# Patient Record
Sex: Female | Born: 1945
Health system: Southern US, Community
[De-identification: ages and names within clinical notes are randomized; demographics above are authoritative.]

## PROBLEM LIST (undated history)

## (undated) DIAGNOSIS — Z5189 Encounter for other specified aftercare: Secondary | ICD-10-CM

## (undated) DIAGNOSIS — K5730 Diverticulosis of large intestine without perforation or abscess without bleeding: Secondary | ICD-10-CM

## (undated) DIAGNOSIS — D126 Benign neoplasm of colon, unspecified: Secondary | ICD-10-CM

## (undated) DIAGNOSIS — N952 Postmenopausal atrophic vaginitis: Secondary | ICD-10-CM

## (undated) DIAGNOSIS — B9681 Helicobacter pylori [H. pylori] as the cause of diseases classified elsewhere: Secondary | ICD-10-CM

## (undated) DIAGNOSIS — M899 Disorder of bone, unspecified: Secondary | ICD-10-CM

## (undated) DIAGNOSIS — L989 Disorder of the skin and subcutaneous tissue, unspecified: Secondary | ICD-10-CM

## (undated) DIAGNOSIS — IMO0002 Reserved for concepts with insufficient information to code with codable children: Secondary | ICD-10-CM

## (undated) DIAGNOSIS — M949 Disorder of cartilage, unspecified: Secondary | ICD-10-CM

## (undated) DIAGNOSIS — H3530 Unspecified macular degeneration: Secondary | ICD-10-CM

## (undated) DIAGNOSIS — K279 Peptic ulcer, site unspecified, unspecified as acute or chronic, without hemorrhage or perforation: Secondary | ICD-10-CM

## (undated) DIAGNOSIS — K648 Other hemorrhoids: Secondary | ICD-10-CM

## (undated) HISTORY — DX: Encounter for other specified aftercare: Z51.89

## (undated) HISTORY — PX: SKIN CANCER EXCISION: SHX779

## (undated) HISTORY — DX: Helicobacter pylori (H. pylori) as the cause of diseases classified elsewhere: K27.9

## (undated) HISTORY — DX: Disorder of the skin and subcutaneous tissue, unspecified: L98.9

## (undated) HISTORY — DX: Helicobacter pylori (H. pylori) as the cause of diseases classified elsewhere: B96.81

## (undated) HISTORY — DX: Benign neoplasm of colon, unspecified: D12.6

## (undated) HISTORY — PX: OTHER SURGICAL HISTORY: SHX169

## (undated) HISTORY — DX: Other hemorrhoids: K64.8

## (undated) HISTORY — PX: COLONOSCOPY W/ BIOPSIES: SHX1374

## (undated) HISTORY — DX: Unspecified macular degeneration: H35.30

## (undated) HISTORY — DX: Disorder of bone, unspecified: M89.9

## (undated) HISTORY — DX: Disorder of cartilage, unspecified: M94.9

## (undated) HISTORY — DX: Reserved for concepts with insufficient information to code with codable children: IMO0002

## (undated) HISTORY — PX: TUBAL LIGATION: SHX77

## (undated) HISTORY — DX: Postmenopausal atrophic vaginitis: N95.2

## (undated) HISTORY — DX: Diverticulosis of large intestine without perforation or abscess without bleeding: K57.30

---
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
## 1970-09-21 HISTORY — PX: GANGLION CYST EXCISION: SHX1691

## 1998-07-12 ENCOUNTER — Other Ambulatory Visit: Admission: RE | Admit: 1998-07-12 | Discharge: 1998-07-12 | Payer: Self-pay | Admitting: Obstetrics and Gynecology

## 1998-07-30 ENCOUNTER — Ambulatory Visit (HOSPITAL_COMMUNITY): Admission: RE | Admit: 1998-07-30 | Discharge: 1998-07-30 | Payer: Self-pay | Admitting: Obstetrics and Gynecology

## 1999-08-21 ENCOUNTER — Other Ambulatory Visit: Admission: RE | Admit: 1999-08-21 | Discharge: 1999-08-21 | Payer: Self-pay | Admitting: Obstetrics and Gynecology

## 1999-08-22 ENCOUNTER — Ambulatory Visit (HOSPITAL_COMMUNITY): Admission: RE | Admit: 1999-08-22 | Discharge: 1999-08-22 | Payer: Self-pay | Admitting: Obstetrics and Gynecology

## 1999-08-22 ENCOUNTER — Encounter: Payer: Self-pay | Admitting: Obstetrics and Gynecology

## 1999-09-22 HISTORY — PX: LYMPH NODE BIOPSY: SHX201

## 1999-10-09 ENCOUNTER — Encounter (INDEPENDENT_AMBULATORY_CARE_PROVIDER_SITE_OTHER): Payer: Self-pay | Admitting: Specialist

## 1999-10-09 ENCOUNTER — Ambulatory Visit (HOSPITAL_COMMUNITY): Admission: RE | Admit: 1999-10-09 | Discharge: 1999-10-09 | Payer: Self-pay | Admitting: *Deleted

## 2000-09-27 ENCOUNTER — Encounter: Payer: Self-pay | Admitting: Obstetrics and Gynecology

## 2000-09-27 ENCOUNTER — Ambulatory Visit (HOSPITAL_COMMUNITY): Admission: RE | Admit: 2000-09-27 | Discharge: 2000-09-27 | Payer: Self-pay | Admitting: Obstetrics and Gynecology

## 2000-10-06 ENCOUNTER — Other Ambulatory Visit: Admission: RE | Admit: 2000-10-06 | Discharge: 2000-10-06 | Payer: Self-pay | Admitting: Obstetrics and Gynecology

## 2001-03-31 ENCOUNTER — Encounter: Payer: Self-pay | Admitting: Obstetrics and Gynecology

## 2001-03-31 ENCOUNTER — Encounter: Admission: RE | Admit: 2001-03-31 | Discharge: 2001-03-31 | Payer: Self-pay | Admitting: Obstetrics and Gynecology

## 2001-11-22 ENCOUNTER — Other Ambulatory Visit: Admission: RE | Admit: 2001-11-22 | Discharge: 2001-11-22 | Payer: Self-pay | Admitting: Obstetrics and Gynecology

## 2001-11-23 ENCOUNTER — Ambulatory Visit (HOSPITAL_COMMUNITY): Admission: RE | Admit: 2001-11-23 | Discharge: 2001-11-23 | Payer: Self-pay | Admitting: Obstetrics and Gynecology

## 2001-11-23 ENCOUNTER — Encounter: Payer: Self-pay | Admitting: Obstetrics and Gynecology

## 2002-11-27 ENCOUNTER — Ambulatory Visit (HOSPITAL_COMMUNITY): Admission: RE | Admit: 2002-11-27 | Discharge: 2002-11-27 | Payer: Self-pay | Admitting: Obstetrics and Gynecology

## 2002-11-27 ENCOUNTER — Encounter: Payer: Self-pay | Admitting: Obstetrics and Gynecology

## 2003-01-17 ENCOUNTER — Other Ambulatory Visit: Admission: RE | Admit: 2003-01-17 | Discharge: 2003-01-17 | Payer: Self-pay | Admitting: Obstetrics and Gynecology

## 2003-11-29 ENCOUNTER — Ambulatory Visit (HOSPITAL_COMMUNITY): Admission: RE | Admit: 2003-11-29 | Discharge: 2003-11-29 | Payer: Self-pay | Admitting: Obstetrics and Gynecology

## 2004-01-31 ENCOUNTER — Other Ambulatory Visit: Admission: RE | Admit: 2004-01-31 | Discharge: 2004-01-31 | Payer: Self-pay | Admitting: Obstetrics and Gynecology

## 2004-10-23 ENCOUNTER — Encounter: Admission: RE | Admit: 2004-10-23 | Discharge: 2004-10-23 | Payer: Self-pay | Admitting: Obstetrics and Gynecology

## 2004-11-12 ENCOUNTER — Ambulatory Visit: Payer: Self-pay | Admitting: Internal Medicine

## 2004-11-13 ENCOUNTER — Ambulatory Visit: Payer: Self-pay | Admitting: Internal Medicine

## 2005-01-20 ENCOUNTER — Ambulatory Visit (HOSPITAL_COMMUNITY): Admission: RE | Admit: 2005-01-20 | Discharge: 2005-01-20 | Payer: Self-pay | Admitting: Obstetrics and Gynecology

## 2005-02-02 ENCOUNTER — Other Ambulatory Visit: Admission: RE | Admit: 2005-02-02 | Discharge: 2005-02-02 | Payer: Self-pay | Admitting: Addiction Medicine

## 2005-06-01 ENCOUNTER — Encounter: Admission: RE | Admit: 2005-06-01 | Discharge: 2005-06-01 | Payer: Self-pay | Admitting: Otolaryngology

## 2006-01-19 HISTORY — PX: OTHER SURGICAL HISTORY: SHX169

## 2006-01-26 ENCOUNTER — Ambulatory Visit (HOSPITAL_COMMUNITY): Admission: RE | Admit: 2006-01-26 | Discharge: 2006-01-26 | Payer: Self-pay | Admitting: Obstetrics and Gynecology

## 2006-01-31 ENCOUNTER — Inpatient Hospital Stay (HOSPITAL_COMMUNITY): Admission: EM | Admit: 2006-01-31 | Discharge: 2006-02-02 | Payer: Self-pay | Admitting: Emergency Medicine

## 2006-01-31 ENCOUNTER — Ambulatory Visit: Payer: Self-pay | Admitting: Internal Medicine

## 2006-01-31 DIAGNOSIS — IMO0002 Reserved for concepts with insufficient information to code with codable children: Secondary | ICD-10-CM

## 2006-01-31 HISTORY — DX: Reserved for concepts with insufficient information to code with codable children: IMO0002

## 2006-02-01 ENCOUNTER — Encounter (INDEPENDENT_AMBULATORY_CARE_PROVIDER_SITE_OTHER): Payer: Self-pay | Admitting: Specialist

## 2006-02-04 ENCOUNTER — Ambulatory Visit: Payer: Self-pay | Admitting: Internal Medicine

## 2006-02-08 ENCOUNTER — Ambulatory Visit: Payer: Self-pay | Admitting: Internal Medicine

## 2006-03-04 ENCOUNTER — Other Ambulatory Visit: Admission: RE | Admit: 2006-03-04 | Discharge: 2006-03-04 | Payer: Self-pay | Admitting: Obstetrics and Gynecology

## 2006-03-11 ENCOUNTER — Ambulatory Visit: Payer: Self-pay | Admitting: Internal Medicine

## 2006-03-16 ENCOUNTER — Ambulatory Visit: Payer: Self-pay | Admitting: Internal Medicine

## 2006-03-23 ENCOUNTER — Ambulatory Visit: Payer: Self-pay | Admitting: Endocrinology

## 2006-03-30 ENCOUNTER — Ambulatory Visit: Payer: Self-pay | Admitting: Internal Medicine

## 2006-03-30 HISTORY — PX: ESOPHAGOGASTRODUODENOSCOPY: SHX1529

## 2006-05-14 ENCOUNTER — Ambulatory Visit: Payer: Self-pay | Admitting: Internal Medicine

## 2006-06-08 ENCOUNTER — Ambulatory Visit: Payer: Self-pay | Admitting: Internal Medicine

## 2006-06-11 ENCOUNTER — Ambulatory Visit: Payer: Self-pay | Admitting: Internal Medicine

## 2007-01-28 ENCOUNTER — Ambulatory Visit (HOSPITAL_COMMUNITY): Admission: RE | Admit: 2007-01-28 | Discharge: 2007-01-28 | Payer: Self-pay | Admitting: Obstetrics and Gynecology

## 2007-03-09 ENCOUNTER — Other Ambulatory Visit: Admission: RE | Admit: 2007-03-09 | Discharge: 2007-03-09 | Payer: Self-pay | Admitting: Obstetrics and Gynecology

## 2007-03-21 ENCOUNTER — Ambulatory Visit: Payer: Self-pay | Admitting: Internal Medicine

## 2007-06-10 ENCOUNTER — Encounter: Payer: Self-pay | Admitting: *Deleted

## 2007-06-10 DIAGNOSIS — Z860100 Personal history of colon polyps, unspecified: Secondary | ICD-10-CM | POA: Insufficient documentation

## 2007-06-10 DIAGNOSIS — K2990 Gastroduodenitis, unspecified, without bleeding: Secondary | ICD-10-CM

## 2007-06-10 DIAGNOSIS — K2970 Gastritis, unspecified, without bleeding: Secondary | ICD-10-CM | POA: Insufficient documentation

## 2007-10-10 ENCOUNTER — Ambulatory Visit: Payer: Self-pay | Admitting: Internal Medicine

## 2007-10-26 ENCOUNTER — Ambulatory Visit: Payer: Self-pay | Admitting: Internal Medicine

## 2007-10-26 DIAGNOSIS — Z8711 Personal history of peptic ulcer disease: Secondary | ICD-10-CM | POA: Insufficient documentation

## 2007-10-26 DIAGNOSIS — K5730 Diverticulosis of large intestine without perforation or abscess without bleeding: Secondary | ICD-10-CM | POA: Insufficient documentation

## 2007-10-26 HISTORY — DX: Diverticulosis of large intestine without perforation or abscess without bleeding: K57.30

## 2007-10-26 LAB — HM COLONOSCOPY

## 2007-12-09 ENCOUNTER — Ambulatory Visit: Payer: Self-pay | Admitting: Internal Medicine

## 2007-12-12 LAB — CONVERTED CEMR LAB
BUN: 11 mg/dL (ref 6–23)
Basophils Absolute: 0 10*3/uL (ref 0.0–0.1)
Basophils Relative: 0.4 % (ref 0.0–1.0)
Bilirubin Urine: NEGATIVE
Bilirubin, Direct: 0.1 mg/dL (ref 0.0–0.3)
CO2: 32 meq/L (ref 19–32)
Calcium: 9.9 mg/dL (ref 8.4–10.5)
Chloride: 104 meq/L (ref 96–112)
Creatinine, Ser: 0.7 mg/dL (ref 0.4–1.2)
Direct LDL: 120.1 mg/dL
Eosinophils Relative: 2.7 % (ref 0.0–5.0)
HCT: 43.8 % (ref 36.0–46.0)
HDL: 97.2 mg/dL (ref >39.0)
Hemoglobin, Urine: NEGATIVE
MCHC: 32.2 g/dL (ref 30.0–36.0)
Monocytes Absolute: 0.5 10*3/uL (ref 0.2–0.7)
Neutro Abs: 3.6 10*3/uL (ref 1.4–7.7)
Neutrophils Relative %: 62.7 % (ref 43.0–77.0)
RBC: 4.67 M/uL (ref 3.87–5.11)
RDW: 11.7 % (ref 11.5–14.6)
Sodium: 141 meq/L (ref 135–145)
Total CHOL/HDL Ratio: 2.3
Urine Glucose: NEGATIVE mg/dL
Urobilinogen, UA: 0.2 (ref 0.0–1.0)
VLDL: 8 mg/dL (ref 0–40)
WBC: 5.8 10*3/uL (ref 4.5–10.5)

## 2007-12-14 ENCOUNTER — Ambulatory Visit: Payer: Self-pay | Admitting: Internal Medicine

## 2007-12-14 DIAGNOSIS — R209 Unspecified disturbances of skin sensation: Secondary | ICD-10-CM | POA: Insufficient documentation

## 2007-12-14 DIAGNOSIS — N3090 Cystitis, unspecified without hematuria: Secondary | ICD-10-CM | POA: Insufficient documentation

## 2008-01-18 DIAGNOSIS — K648 Other hemorrhoids: Secondary | ICD-10-CM

## 2008-01-18 DIAGNOSIS — R51 Headache: Secondary | ICD-10-CM

## 2008-01-18 DIAGNOSIS — M810 Age-related osteoporosis without current pathological fracture: Secondary | ICD-10-CM | POA: Insufficient documentation

## 2008-01-18 DIAGNOSIS — D126 Benign neoplasm of colon, unspecified: Secondary | ICD-10-CM | POA: Insufficient documentation

## 2008-01-18 DIAGNOSIS — R519 Headache, unspecified: Secondary | ICD-10-CM | POA: Insufficient documentation

## 2008-01-18 DIAGNOSIS — M8580 Other specified disorders of bone density and structure, unspecified site: Secondary | ICD-10-CM

## 2008-01-18 HISTORY — DX: Benign neoplasm of colon, unspecified: D12.6

## 2008-01-18 HISTORY — DX: Other hemorrhoids: K64.8

## 2008-02-09 ENCOUNTER — Ambulatory Visit (HOSPITAL_COMMUNITY): Admission: RE | Admit: 2008-02-09 | Discharge: 2008-02-09 | Payer: Self-pay | Admitting: Obstetrics and Gynecology

## 2008-02-16 ENCOUNTER — Ambulatory Visit: Payer: Self-pay | Admitting: Internal Medicine

## 2008-02-21 ENCOUNTER — Ambulatory Visit: Payer: Self-pay | Admitting: Internal Medicine

## 2008-02-21 ENCOUNTER — Telehealth: Payer: Self-pay | Admitting: Internal Medicine

## 2008-02-21 DIAGNOSIS — J069 Acute upper respiratory infection, unspecified: Secondary | ICD-10-CM | POA: Insufficient documentation

## 2008-03-12 ENCOUNTER — Other Ambulatory Visit: Admission: RE | Admit: 2008-03-12 | Discharge: 2008-03-12 | Payer: Self-pay | Admitting: Obstetrics and Gynecology

## 2008-07-03 ENCOUNTER — Ambulatory Visit: Payer: Self-pay | Admitting: Internal Medicine

## 2008-07-03 DIAGNOSIS — M545 Low back pain, unspecified: Secondary | ICD-10-CM | POA: Insufficient documentation

## 2008-07-03 DIAGNOSIS — R5382 Chronic fatigue, unspecified: Secondary | ICD-10-CM | POA: Insufficient documentation

## 2008-07-03 LAB — CONVERTED CEMR LAB
Bilirubin Urine: NEGATIVE
Hemoglobin, Urine: NEGATIVE
Ketones, ur: NEGATIVE mg/dL
Specific Gravity, Urine: <=1.005 (ref 1.000–1.03)
Total Protein, Urine: NEGATIVE mg/dL
pH: 6 (ref 5.0–8.0)

## 2008-07-13 ENCOUNTER — Telehealth: Payer: Self-pay | Admitting: Internal Medicine

## 2008-07-13 ENCOUNTER — Ambulatory Visit: Payer: Self-pay | Admitting: Internal Medicine

## 2008-07-13 LAB — CONVERTED CEMR LAB
Bilirubin Urine: NEGATIVE
Ketones, ur: NEGATIVE mg/dL
Mucus, UA: NEGATIVE
RBC / HPF: NONE SEEN
Urobilinogen, UA: 0.2 (ref 0.0–1.0)
pH: 6 (ref 5.0–8.0)

## 2008-07-19 ENCOUNTER — Encounter: Admission: RE | Admit: 2008-07-19 | Discharge: 2008-07-19 | Payer: Self-pay | Admitting: Internal Medicine

## 2008-07-31 ENCOUNTER — Ambulatory Visit: Payer: Self-pay | Admitting: Internal Medicine

## 2008-07-31 DIAGNOSIS — J019 Acute sinusitis, unspecified: Secondary | ICD-10-CM | POA: Insufficient documentation

## 2008-08-01 LAB — CONVERTED CEMR LAB
Albumin: 4.3 g/dL (ref 3.5–5.2)
Alkaline Phosphatase: 59 units/L (ref 39–117)
Basophils Absolute: 0 10*3/uL (ref 0.0–0.1)
Basophils Relative: 0.3 % (ref 0.0–3.0)
Bilirubin Urine: NEGATIVE
CO2: 30 meq/L (ref 19–32)
Calcium: 9.6 mg/dL (ref 8.4–10.5)
Chloride: 104 meq/L (ref 96–112)
Crystals: NEGATIVE
GFR calc non Af Amer: 108 mL/min
HCT: 40 % (ref 36.0–46.0)
Lymphocytes Relative: 31 % (ref 12.0–46.0)
MCHC: 34.4 g/dL (ref 30.0–36.0)
Monocytes Absolute: 0.6 10*3/uL (ref 0.1–1.0)
Monocytes Relative: 9.1 % (ref 3.0–12.0)
Mucus, UA: NEGATIVE
Nitrite: NEGATIVE
Platelets: 259 10*3/uL (ref 150–400)
Sodium: 141 meq/L (ref 135–145)
Total Protein, Urine: NEGATIVE mg/dL
Total Protein: 7.1 g/dL (ref 6.0–8.3)
Urine Glucose: NEGATIVE mg/dL
Urobilinogen, UA: 0.2 (ref 0.0–1.0)
WBC: 6.9 10*3/uL (ref 4.5–10.5)

## 2008-08-08 ENCOUNTER — Ambulatory Visit: Payer: Self-pay | Admitting: Cardiology

## 2008-08-08 ENCOUNTER — Encounter: Payer: Self-pay | Admitting: Internal Medicine

## 2009-02-15 ENCOUNTER — Ambulatory Visit: Payer: Self-pay | Admitting: Internal Medicine

## 2009-02-15 LAB — CONVERTED CEMR LAB
ALT: 19 units/L (ref 0–35)
AST: 33 units/L (ref 0–37)
Alkaline Phosphatase: 69 units/L (ref 39–117)
Bilirubin, Direct: 0.2 mg/dL (ref 0.0–0.3)
Chloride: 111 meq/L (ref 96–112)
Cholesterol: 229 mg/dL — ABNORMAL HIGH (ref 0–200)
Direct LDL: 111.7 mg/dL
HDL: 106 mg/dL (ref >39.00)
Ketones, ur: NEGATIVE mg/dL
Lymphocytes Relative: 23.2 % (ref 12.0–46.0)
Lymphs Abs: 1.5 10*3/uL (ref 0.7–4.0)
MCV: 95.2 fL (ref 78.0–100.0)
Monocytes Relative: 8.8 % (ref 3.0–12.0)
Nitrite: NEGATIVE
Platelets: 235 10*3/uL (ref 150.0–400.0)
Potassium: 4.6 meq/L (ref 3.5–5.1)
Sodium: 146 meq/L — ABNORMAL HIGH (ref 135–145)
Specific Gravity, Urine: 1.01 (ref 1.000–1.030)
Total CHOL/HDL Ratio: 2
Total Protein: 6.9 g/dL (ref 6.0–8.3)
Triglycerides: 44 mg/dL (ref 0.0–149.0)
Urine Glucose: NEGATIVE mg/dL
Urobilinogen, UA: 0.2 (ref 0.0–1.0)
WBC: 6.3 10*3/uL (ref 4.5–10.5)
pH: 5.5 (ref 5.0–8.0)

## 2009-02-20 ENCOUNTER — Ambulatory Visit: Payer: Self-pay | Admitting: Internal Medicine

## 2009-02-20 ENCOUNTER — Ambulatory Visit (HOSPITAL_COMMUNITY): Admission: RE | Admit: 2009-02-20 | Discharge: 2009-02-20 | Payer: Self-pay | Admitting: Obstetrics and Gynecology

## 2009-03-21 ENCOUNTER — Encounter: Payer: Self-pay | Admitting: Obstetrics and Gynecology

## 2009-03-21 ENCOUNTER — Ambulatory Visit: Payer: Self-pay | Admitting: Obstetrics and Gynecology

## 2009-03-21 ENCOUNTER — Other Ambulatory Visit: Admission: RE | Admit: 2009-03-21 | Discharge: 2009-03-21 | Payer: Self-pay | Admitting: Obstetrics and Gynecology

## 2010-01-30 ENCOUNTER — Ambulatory Visit: Payer: Self-pay | Admitting: Obstetrics and Gynecology

## 2010-01-30 ENCOUNTER — Telehealth: Payer: Self-pay | Admitting: Internal Medicine

## 2010-02-06 ENCOUNTER — Ambulatory Visit: Payer: Self-pay | Admitting: Obstetrics and Gynecology

## 2010-02-07 ENCOUNTER — Telehealth (INDEPENDENT_AMBULATORY_CARE_PROVIDER_SITE_OTHER): Payer: Self-pay | Admitting: *Deleted

## 2010-02-07 ENCOUNTER — Ambulatory Visit: Payer: Self-pay | Admitting: Internal Medicine

## 2010-02-07 ENCOUNTER — Telehealth: Payer: Self-pay | Admitting: Internal Medicine

## 2010-02-07 DIAGNOSIS — J189 Pneumonia, unspecified organism: Secondary | ICD-10-CM | POA: Insufficient documentation

## 2010-02-07 DIAGNOSIS — R509 Fever, unspecified: Secondary | ICD-10-CM | POA: Insufficient documentation

## 2010-02-11 LAB — CONVERTED CEMR LAB
AST: 30 units/L (ref 0–37)
Alkaline Phosphatase: 65 units/L (ref 39–117)
BUN: 12 mg/dL (ref 6–23)
Basophils Absolute: 0 10*3/uL (ref 0.0–0.1)
Bilirubin, Direct: 0.1 mg/dL (ref 0.0–0.3)
CO2: 29 meq/L (ref 19–32)
Creatinine, Ser: 0.6 mg/dL (ref 0.4–1.2)
Eosinophils Relative: 4.1 % (ref 0.0–5.0)
Hemoglobin: 14.1 g/dL (ref 12.0–15.0)
Lymphs Abs: 1.2 10*3/uL (ref 0.7–4.0)
Monocytes Relative: 6.8 % (ref 3.0–12.0)
Neutro Abs: 9.1 10*3/uL — ABNORMAL HIGH (ref 1.4–7.7)
Neutrophils Relative %: 78.3 % — ABNORMAL HIGH (ref 43.0–77.0)
RDW: 12.8 % (ref 11.5–14.6)
Sed Rate: 24 mm/hr — ABNORMAL HIGH (ref 0–22)
TSH: 1.55 microintl units/mL (ref 0.35–5.50)
Total Protein: 6.8 g/dL (ref 6.0–8.3)
Urine Glucose: NEGATIVE mg/dL
Urobilinogen, UA: 0.2 (ref 0.0–1.0)
Vitamin B-12: 350 pg/mL (ref 211–911)

## 2010-02-24 ENCOUNTER — Ambulatory Visit (HOSPITAL_COMMUNITY): Admission: RE | Admit: 2010-02-24 | Discharge: 2010-02-24 | Payer: Self-pay | Admitting: Obstetrics and Gynecology

## 2010-02-24 LAB — HM MAMMOGRAPHY: HM Mammogram: NEGATIVE

## 2010-04-29 ENCOUNTER — Ambulatory Visit: Payer: Self-pay | Admitting: Obstetrics and Gynecology

## 2010-04-29 ENCOUNTER — Other Ambulatory Visit: Admission: RE | Admit: 2010-04-29 | Discharge: 2010-04-29 | Payer: Self-pay | Admitting: Obstetrics and Gynecology

## 2010-10-21 NOTE — Progress Notes (Signed)
Summary: RESULTS  Phone Note Outgoing Call   Summary of Call: Maralyn Sago, pls call her before you go to confirm pneumonia on cxr - pick up 2 antibioticks (check VM on home tel). Thx! Initial call taken by: Tresa Garter MD,  Feb 07, 2010 5:37 PM  Follow-up for Phone Call        left mess to call office back. Follow-up by: Lamar Sprinkles, CMA,  Feb 07, 2010 6:03 PM  Additional Follow-up for Phone Call Additional follow up Details #1::        Pt informed  Additional Follow-up by: Lamar Sprinkles, CMA,  Feb 10, 2010 5:02 PM

## 2010-10-21 NOTE — Progress Notes (Signed)
Summary: NEEDS OV TODAY  Phone Note Call from Patient Call back at Home Phone [phone]   Summary of Call: Pt c/o chills and h/a, recently treated by gyn for uti. Pt left vm requesting office visit w/plot today. Initial call taken by: Lamar Sprinkles, CMA,  Feb 07, 2010 9:25 AM  Follow-up for Phone Call        Appt today at 3:45 pm w/Dr Plotnikov Follow-up by: Verdell Face,  Feb 07, 2010 9:41 AM

## 2010-10-21 NOTE — Assessment & Plan Note (Signed)
Summary: per flag today/chills/no fever/cd   Vital Signs:  Patient profile:   65 year old female Height:      66 inches Weight:      99 pounds BMI:     16.04 O2 Sat:      106 % on 70 Temp:     99.4 degrees F oral Pulse rate:   84 / minute BP sitting:   106 / 70  (left arm) Cuff size:   regular  Vitals Entered By: Bill Salinas CMA (Feb 07, 2010 4:01 PM)  O2 Flow:  70 CC: C/O recent uti, fever, joint pain, HA, and hot skin./kb Is Patient Diabetic? No Pain Assessment Patient in pain? no        CC:  C/O recent uti, fever, joint pain, HA, and and hot skin./kb.  History of Present Illness: C/o UTI (achy, joint aces, HA, LBP, urgency) last Thur - took Nitrofurantoin, felt better while on it and had a repeated nl UA yesterday. She is feeling worse again.  She started to have aches, fatigue, somnolence x 1.5 wks ago. C/o dry infrequent cough.   Current Medications (verified): 1)  Vitamin D3 1000 Unit  Tabs (Cholecalciferol) .[name] Kitchen.. 1 By Mouth Daily 2)  Calcium 1000unit Tabs .[name] Kitchen.. 1 By Mouth Qd  Allergies (verified): No Known Drug Allergies  Past History:  Past Medical History: Last updated: 10/26/2007 Colonic polyps, hx of Gastric ulcer and GI bleed 5/07 hospitalization s/p eradication of H. pylori (confirmed by urea breath test) Osteopenia Headaches Mild diverticulosis (colonoscopy 10/26/07)  Social History: Last updated: 12/14/2007 Occupation:accountant Married Never Smoked Regular exercise-yes  Review of Systems       The patient complains of fever and prolonged cough.  The patient denies weight loss, chest pain, syncope, dyspnea on exertion, abdominal pain, melena, hematochezia, severe indigestion/heartburn, hematuria, incontinence, genital sores, difficulty walking, and angioedema.    Physical Exam  General:  Thin, NAD, looks tired Head:  Normocephalic and atraumatic without obvious abnormalities. No apparent alopecia or balding. Eyes:  No corneal or conjunctival  inflammation noted. EOMI. Perrla Ears:  External ear exam shows no significant lesions or deformities.  Otoscopic examination reveals clear canals, tympanic membranes are intact bilaterally without bulging, retraction, inflammation or discharge. Hearing is grossly normal bilaterally. Nose:  External nasal examination shows no deformity or inflammation. Nasal mucosa are pink and moist without lesions or exudates. Mouth:  Oral mucosa and oropharynx without lesions or exudates.  Teeth in good repair. Neck:  No deformities, masses, or tenderness noted. Lungs:  Normal respiratory effort, chest expands symmetrically. Lungs are clear to auscultation, no crackles or wheezes. Heart:  Normal rate and regular rhythm. S1 and S2 normal without gallop, murmur, click, rub or other extra sounds. Abdomen:  Bowel sounds positive,abdomen soft and non-tender without masses, organomegaly or hernias noted. Msk:  No deformity or scoliosis noted of thoracic or lumbar spine.   Pulses:  R and L carotid,radial,femoral,dorsalis pedis and posterior tibial pulses are full and equal bilaterally Extremities:  No clubbing, cyanosis, edema, or deformity noted with normal full range of motion of all joints.   Neurologic:  No cranial nerve deficits noted. Station and gait are normal. Plantar reflexes are down-going bilaterally. DTRs are symmetrical throughout. Sensory, motor and coordinative functions appear intact. Skin:  Intact without suspicious lesions or rashes Cervical Nodes:  No lymphadenopathy noted Psych:  Cognition and judgment appear intact. Alert and cooperative with normal attention span and concentration. No apparent delusions, illusions, hallucinations   Impression &  Recommendations:  Problem # 1:  FEVER UNSPECIFIED (ICD-780.60) Assessment New  Orders: TLB-B12, Serum-Total ONLY (98119-J47) TLB-BMP (Basic Metabolic Panel-BMET) (80048-METABOL) TLB-CBC Platelet - w/Differential (85025-CBCD) TLB-Hepatic/Liver  Function Pnl (80076-HEPATIC) TLB-Sedimentation Rate (ESR) (85652-ESR) TLB-TSH (Thyroid Stimulating Hormone) (84443-TSH) TLB-Udip ONLY (81003-UDIP) TLB-Mono Test (Monospot) (82956-OZHY) T-Blood Culture (86578-46962) T-2 View CXR, Same Day (71020.5TC)  Problem # 2:  FATIGUE (ICD-780.79) Assessment: New  Orders: TLB-B12, Serum-Total ONLY (95284-X32) TLB-BMP (Basic Metabolic Panel-BMET) (80048-METABOL) TLB-CBC Platelet - w/Differential (85025-CBCD) TLB-Hepatic/Liver Function Pnl (80076-HEPATIC) TLB-Sedimentation Rate (ESR) (85652-ESR) TLB-TSH (Thyroid Stimulating Hormone) (84443-TSH) TLB-Udip ONLY (81003-UDIP) TLB-Mono Test (Monospot) (44010-UVOZ) T-Blood Culture (36644-03474)  Problem # 3:  HEADACHE (ICD-784.0) Assessment: New  Her updated medication list for this problem includes:    Fioricet 50-325-40 Mg Tabs (Butalbital-apap-caffeine) .[name] Kitchen... 1-2 by mouth two times a day as needed migraine or tension ha  Orders: TLB-B12, Serum-Total ONLY (25956-L87) TLB-BMP (Basic Metabolic Panel-BMET) (80048-METABOL) TLB-CBC Platelet - w/Differential (85025-CBCD) TLB-Hepatic/Liver Function Pnl (80076-HEPATIC) TLB-Sedimentation Rate (ESR) (85652-ESR) TLB-TSH (Thyroid Stimulating Hormone) (84443-TSH) TLB-Udip ONLY (81003-UDIP) TLB-Mono Test (Monospot) (56433-IRJJ) T-Blood Culture (88416-60630)  Problem # 4:  PNEUMONIA (ICD-486) LLL, likely caused all of the above Assessment: New  I left a VM w/results Two antibiotics to take  Complete Medication List: 1)  Vitamin D3 1000 Unit Tabs (Cholecalciferol) .[name] Kitchen.. 1 by mouth daily 2)  Calcium 1000unit Tabs  .[name] KitchenMarland Kitchen. 1 by mouth qd 3)  Zithromax Z-pak 250 Mg Tabs (Azithromycin) .... As dirrected 4)  Fioricet 50-325-40 Mg Tabs (Butalbital-apap-caffeine) .[name] Kitchen.. 1-2 by mouth two times a day as needed migraine or tension ha 5)  Ceftin 500 Mg Tabs (Cefuroxime axetil) .[name] Kitchen.. 1 by mouth bid  Patient Instructions: 1)  Call if you are not better in a reasonable  amount of time or if worse. Go to ER if feeling really bad! 2)  Please schedule a follow-up appointment in 2 months. Prescriptions: CEFTIN 500 MG TABS (CEFUROXIME AXETIL) 1 by mouth bid  #20 x 1   Entered and Authorized by:   Tresa Garter MD   Signed by:   Tresa Garter MD on 02/07/2010   Method used:   Electronically to        [facility]  [address] [id number]* (retail)       [address]       [place], [place]  [address]       Ph: [phone] or [phone]       Fax: [phone]   RxID:   [id number] FIORICET 50-325-40 MG TABS (BUTALBITAL-APAP-CAFFEINE) 1-2 by mouth two times a day as needed migraine or tension HA  #60 x 2   Entered and Authorized by:   Tresa Garter MD   Signed by:   Tresa Garter MD on 02/07/2010   Method used:   Print then Give to Patient   RxID:   [id number] ZITHROMAX Z-PAK 250 MG TABS (AZITHROMYCIN) as dirrected  #1 x 0   Entered and Authorized by:   Tresa Garter MD   Signed by:   Tresa Garter MD on 02/07/2010   Method used:   Print then Give to Patient   RxID:   [id number] FIORICET 50-325-40 MG TABS (BUTALBITAL-APAP-CAFFEINE) 1-2 by mouth two times a day as needed migraine or tension HA  #60 x 2   Entered and Authorized by:   Tresa Garter MD   Signed by:   Tresa Garter MD on 02/07/2010   Method used:  Electronically to        [id number]* (retail)       [address]       [place], [place]  [address]       Ph: [phone] or [phone]       Fax: [phone]   RxID:   [id number] ZITHROMAX Z-PAK 250 MG TABS (AZITHROMYCIN) as dirrected  #1 x 0   Entered and Authorized by:   Tresa Garter MD   Signed by:   Tresa Garter MD on 02/07/2010   Method used:   Electronically to        [id number] (retail)       [address]       [place], [place]  [address]       Ph:  [phone] or [phone]       Fax: [phone]   RxID:   [id number]

## 2010-10-21 NOTE — Progress Notes (Signed)
Summary: ABX?  Phone Note Call from Patient Call back at [facility] Phone [phone]   Summary of Call: Pt c/o frequent urination and feels she has a uti. Patient is requesting rx for antibiotic.  Initial call taken by: Lamar Sprinkles, CMA,  Jan 30, 2010 9:59 AM  Follow-up for Phone Call        OK Cipro OV if sick  Follow-up by: Tresa Garter MD,  Jan 30, 2010 12:03 PM  Additional Follow-up for Phone Call Additional follow up Details #1::        Pt already recieved rx from GYN Additional Follow-up by: Lamar Sprinkles, CMA,  Jan 30, 2010 1:44 PM    New/Updated Medications: CIPROFLOXACIN HCL 250 MG TABS (CIPROFLOXACIN HCL) 1 by mouth two times a day for cystitis Prescriptions: CIPROFLOXACIN HCL 250 MG TABS (CIPROFLOXACIN HCL) 1 by mouth two times a day for cystitis  #10 x 0   Entered and Authorized by:   Tresa Garter MD   Signed by:   Lamar Sprinkles, CMA on 01/30/2010   Method used:   Electronically to        [id number] (retail)       [address]       [place], [place]  [address]       Ph: [phone] or [phone]       Fax: [phone]   RxID:   [id number]

## 2011-02-03 NOTE — Assessment & Plan Note (Signed)
[facility]                         GASTROENTEROLOGY OFFICE NOTE   NAME:[name], [name] TROIANO                      MRN:          [id number]  DATE:03/21/2007                            DOB:          1946/02/12    CHIEF COMPLAINT:  Follow up of ulcer.   PROBLEMS:  1. History of gastric ulcer with GI bleed.  2. Status post eradication (breast test confirmed) of H.pylori.  3. Previous history of adenomatous colon polyps, last colonoscopy February 23, 2002, with hyperplastic rectal polyps, otherwise, negative (Dr.      Juanda Chance.  Note, the patient requested to change to me for follow      up).  4. History of childhood anemia, treated with iron.  5. History of headaches.  6. History of osteopenia.  7. Prior tonsillectomy.  8. Prior appendectomy.  9. Prior right ganglion cyst surgery.  10.Benign lymph node biopsy from the groin.  11.Skin cancer removed from the forehead in the past.   MEDICATIONS:  1. Omeprazole 20 mg daily.  2. Calcium daily.  3. Multivitamin daily.   INTERVAL HISTORY:  She has done well except for occasional upper  abdominal pains that have gone off and on for years.  Sometimes when she  drinks water she will have some epigastric aching or pain.  Certain  foods will do that as well.  There has been no weight loss.  No steady  persistent symptoms that have been chronically intermittent.  She did  receive a recall notice for her colonoscopy in April.  That is roughly a  five year anniversary.  She is wondering about putting that off for  about six months or so.   PHYSICAL EXAMINATION:  VITAL SIGNS:  Weight 107, pulse 78, blood  pressure 118/72.   ASSESSMENT:  1. Personal history of peptic ulcer disease, resolved.  She rarely      uses NSAIDs at this time.  2. Symptoms compatible with irritable bowel syndrome/functional      dyspepsia.  3. Personal history of adenomatous colon polyps, though none on last      colonoscopy five years  ago.   RECOMMENDATIONS/PLAN:  1. I think she can try to taper off the Omeprazole.  I have given her      a refill for year, and she will see how she does.  If she needs to      take it for symptom control, she can do so.  2. Monitor these other symptoms, but they are stable and chronic, and      episodic as mentioned.  There were no worrisome features.  3. She will call back to schedule her colonoscopy.  She may wait until      January of 2009.  I told her that would probably be okay, but there      was no way I could guarantee a significant polyp or lesion would      not show up in the interim, and the decision was hers.  She      understands that.     Iva Boop,  MD,FACG  Electronically Signed    CEG/MedQ  DD: 03/21/2007  DT: 03/22/2007  Job #: [id number]

## 2011-02-05 ENCOUNTER — Other Ambulatory Visit: Payer: Self-pay | Admitting: Obstetrics and Gynecology

## 2011-02-05 DIAGNOSIS — Z1231 Encounter for screening mammogram for malignant neoplasm of breast: Secondary | ICD-10-CM

## 2011-02-06 NOTE — Assessment & Plan Note (Signed)
[facility]                                 ON-CALL NOTE   NAME:Culhane, KAYSEE HERGERT                      MRN:          [id number]  DATE:08/11/2008                            DOB:          Aug 12, 1946    PHONE NUMBER:  [phone].   PRIMARY CARE PHYSICIAN:  Georgina Quint. Plotnikov, MD   SUBJECTIVE:  Ever since she had a CT scan at the end of last week, she  has been noting some hives around her waist as well as itchiness at the  injection site.  She believes she was given the contrast at that area.  She denies shortness of breath or tongue swelling.   ASSESSMENT AND PLAN:  I recommended Benadryl 25-50 mg every 6 hours  p.r.n. itching and hives.  If she is sedated with Benadryl, she can  instead use a non-drowsy antihistamine such as Claritin or Zyrtec.     Kerby Nora, MD  Electronically Signed    AB/MedQ  DD: 08/11/2008  DT: 08/11/2008  Job #: [id number]

## 2011-02-06 NOTE — Consult Note (Signed)
[name], BOXER NO.:  [id number]   MEDICAL RECORD NO.:  [id number]          PATIENT TYPE:  INP   LOCATION:  3707                         FACILITY:  [facility]   PHYSICIAN:  Iva Boop, M.D. LHCDATE OF BIRTH:  1945/09/26   DATE OF CONSULTATION:  02/01/2006  DATE OF DISCHARGE:                                   CONSULTATION   REQUESTING PHYSICIAN:  Rene Paci, M.D.   PRIMARY CARE PHYSICIAN:  Georgina Quint. Plotnikov, M.D.   REASON FOR CONSULTATION:  Gastrointestinal bleeding.   ASSESSMENT:  History of melena, heme positive stool and anemia with  hemoglobin 7.4. Her BUN was 37, creatinine 0.6. She has had some presyncope  and possible syncope.   I think it is quite likely that she has an upper GI bleed, question from  ulcer disease, etc. She has had some epigastric pain as well.   PLAN:  Schedule upper GI endoscopy to investigate for cause of bleeding.  Note, she had a colonoscopy in 2003 that showed some diminutive colon polyps  (I do not have that pathology).   HISTORY:  A 65 year old white woman with story as above. She was admitted to  the hospital last night with a hemoglobin of 7.4, that has risen to 10.3  after 2 units of packed red cells. BUN 37/0.6 down to 21/0.6. Coags normal.  She had the epigastric pain about a month ago intermittent off and on  radiating up into the neck, fatigue, sleepy and described some melena. She  had Hemoccult positive stool in the emergency department, she has had some  presyncope off and on. Denied dysphagia, chronic heartburn or bowel habit  changes otherwise.   DRUG ALLERGIES:  None known.   MEDICATIONS:  She was on an aspirin at home and that is it. Protonix 40 mg  b.i.d. here.   PAST MEDICAL HISTORY:  1.  Colonoscopy with polyps 2003 as noted.  2.  Childhood anemia treated with iron.  3.  Headaches.  4.  Osteopenia.  5.  Tonsillectomy, appendectomy, right ganglion cyst, benign lymph node      biopsy  from the groin, forehead skin cancer.   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   FAMILY HISTORY:  Peptic ulcer disease in her father.   REVIEW OF SYSTEMS:  Occasional palpitations or irregular heartbeat. She has  been a runner but has changed to walking and hiking over time. Denies edema  and no dyspnea. No dysuria. She did fall and hit the back of her head six  weeks ago. She has had some headache trouble complaints. All other systems  appear negative.   PHYSICAL EXAMINATION:  GENERAL:  Reveals a pleasant, well-developed, white  woman in no acute distress, slightly pale.  VITAL SIGNS:  Temperature 98.3, pulse 74, respirations 18.  HEENT:  Eyes anicteric, mouth free of lesions.  NECK:  Supple with no mass.  CHEST:  Clear.  HEART:  S1, S2, no murmurs, rubs or gallops.  ABDOMEN:  Soft, nontender without any masses or organomegaly.  RECTAL:  As per the emergency room note, Hemoccult positive.  EXTREMITIES:  No cyanosis, clubbing or edema.  PSYCHE:  She is alert and oriented x3.  LYMPH NODES:  No neck or supraclavicular nodes palpated.   LABORATORY DATA:  As above. The remainder of her electrolytes are normal.   EKG normal sinus rhythm, incomplete right bundle branch block is indicated.  Borderline EKG.   I appreciate the opportunity to care for this patient.      Iva Boop, M.D. [facility]  Electronically Signed     CEG/MEDQ  D:  02/01/2006  T:  02/02/2006  Job:  [id number]   cc:   Georgina Quint. Plotnikov, M.D. [facility]  [address]  [place]  [place] [address]

## 2011-02-06 NOTE — H&P (Signed)
[name], [name]               ACCOUNT NO.:  [id number]   MEDICAL RECORD NO.:  [id number]          PATIENT TYPE:  EMS   LOCATION:  MAJO                         FACILITY:  [facility]   PHYSICIAN:  Sean A. Everardo All, M.D. [facility] OF BIRTH:  1946-06-30   DATE OF ADMISSION:  01/31/2006  DATE OF DISCHARGE:                                HISTORY & PHYSICAL   REASON FOR ADMISSION:  Gastrointestinal bleed.   HISTORY OF PRESENT ILLNESS:  A 65 year old woman who states that 6 weeks ago  she had had a presyncopal episode.  She now has 1 week of slight pain at the  epigastric area with several more associated episodes of presyncope and  headache.   PAST MEDICAL HISTORY:  Good general health.   MEDICATIONS:  Aspirin 325 mg daily.   FAMILY HISTORY:  Negative for GI bleed.   SOCIAL HISTORY:  Her husband is with her.  She works as an Airline pilot.  She  is a nonsmoker, and, when I ask her about alcohol, she states a few with  dinner.   REVIEW OF SYSTEMS:  Denies the following:  Rectal bleeding, hematuria, chest  pain, shortness of breath, skin rash, loss of consciousness, visual loss,  dysuria, weight loss, jaundice, dysphagia and diarrhea.   PHYSICAL EXAMINATION:  VITAL SIGNS:  Blood pressure is 111/53, heart rate  77, respiratory rate 20, temperature 97.8.  GENERAL:  Pale.  In no distress.  SKIN:  Not diaphoretic.  HEENT:  No scleral icterus.  Pharynx normal.  NECK:  Supple.  CHEST:  Clear to auscultation.  CARDIOVASCULAR:  No JVD, no edema.  Regular rate and rhythm.  No murmur.  Pedal pulses are intact.  ABDOMEN:  Soft, nontender.  Specifically, there is no tenderness at the  epigastric area.  No hepatosplenomegaly, no mass.  EXTREMITIES:  No deformity is seen.  NEUROLOGIC:  Alert and oriented.  She did not appear anxious or depressed.  She readily moves all fours.   LABORATORY STUDIES:  WBCs 7200, hemoglobin 7.4, platelets 211.  CPK and  troponin are normal, and fecal occult blood is  positive.   IMPRESSION:  1.  Gastrointestinal bleed, probably upper.  2.  Anemia due to #1.  3.  Syncope due to #1 and #2.  4.  Epigastric pain, which is probably due to gastric or duodenal ulcer, but      I cannot be certain that this is not chest pain.   PLAN:  1.  Admit to telemetry.  2.  Transfuse.  I discussed risks and benefits, and she agrees.  3.  Three sets of cardiac enzymes.  4.  PPI therapy.  5.  Consult GI.  6.  We discussed code status.  She states she wants to be full code, but      would not want to be started or maintained on artificial life support      measures if there is not a reasonable chance of functional recovery.  7.  Check pro time, CMET and electrocardiogram, if not done.           ______________________________  Gregary Signs  A. Everardo All, M.D. [facility]     SAE/MEDQ  D:  01/31/2006  T:  01/31/2006  Job:  [id number]   cc:   Georgina Quint. Plotnikov, M.D. [facility]  [address]  [place]  [place] [address]

## 2011-02-06 NOTE — Assessment & Plan Note (Signed)
[facility]                           GASTROENTEROLOGY OFFICE NOTE   NAME:[name], [name]                      MRN:          [id number]  DATE:06/08/2006                            DOB:          September 11, 1946    See medical history and physical for full details.   ASSESSMENT:  1. History of prior gastric ulcer, healing demonstrated on      esophagogastroduodenoscopy March 30, 2006.  She was treated for      Helicobacter pylori with Prevpac.  2. She is asymptomatic at this time.   PLAN:  1. H. Pylori breath test to confirm eradications.  2. Resumption of prophylactic aspirin and NSAIDs per patient and Dr.      Posey Rea at this point.  See my EGD note of March 30, 2006, for further      recommendations.   Note, she had some recent dyspnea and indigestion.  She was tried on  Carafate which made her feel worse so she has stopped that.  She can call  back if there are further problems, but she feels well at this time.                                   Iva Boop, MD,FACG   CEG/MedQ  DD:  06/08/2006  DT:  06/09/2006  Job #:  [id number]

## 2011-02-06 NOTE — Discharge Summary (Signed)
[name], [name]               ACCOUNT NO.:  [id number]   MEDICAL RECORD NO.:  [id number]          PATIENT TYPE:  INP   LOCATION:  3707                         FACILITY:  [facility]   PHYSICIAN:  Rene Paci, M.D. LHCDATE OF BIRTH:  Nov 10, 1945   DATE OF ADMISSION:  01/31/2006  DATE OF DISCHARGE:  02/02/2006                                 DISCHARGE SUMMARY   DISCHARGE DIAGNOSES:  1.  Gastric ulcer status post esophagogastroduodenoscopy performed Feb 01, 2006.  2.  Helicobacter pylori positive  3.  Acute blood loss anemia secondary to upper gastrointestinal bleed.  4.  Presyncope, likely secondary to underlying anemia.   HISTORY OF PRESENT ILLNESS:  The patient is 65 year old female who reported  a presyncopal episode approximately 6 weeks ago. At the time of admission,  she reported a 1-week history of slight pain in the epigastric area which  was worse after eating, and she has also had several other associated  episodes of presyncope and headache.  The patient was admitted for further  evaluation.   PAST MEDICAL HISTORY:  1.  History of colon polyps.  2.  History of childhood anemia treated with iron.  3.  Headaches.  4.  History of osteopenia.   COURSE OF HOSPITALIZATION:  #1.  GASTRIC ULCER:  The patient was admitted, and  a GI consult was  obtained. She was noted to have positive fecal occult blood, and the patient  underwent an EGD performed on Feb 01, 2006, by Dr. Stan Head.  EGD  revealed a proximal body ulcer with no active bleeding. A CLOtest was  performed which was positive for H.  Pylori. The patient will be discharged  home on a Prevpac, was instructed to take a proton pump inhibitor, was given  a prescription for Prevacid by GI. She is also instructed to hold off on  taking aspirin for 1 month and then resume at 81 mg, and she is instructed  to start iron 325 mg p.o. twice daily after melena is completely resolved.   #2.  ACUTE BLOOD LOSS ANEMIA:  The patient required 2 units of packed red  blood cells for hemoglobin of 7.4.  The patient's hemoglobin at time of  discharge is 9.5.  She will need a followup CBC at her appointment with her  primary care to ensure that hemoglobin and hematocrit are remaining stable.  She was noted to be mildly hypotensive on admission which improved after  transfusion and IV hydration.   #3.  HISTORY OF PRESYNCOPAL EVENT: The patient's presyncopal event was  likely secondary to underlying anemia and possibly also hypotension in the  setting of acute blood loss.  She currently has no symptoms.   PERTINENT LABORATORY DATA AT DISCHARGE:  Hemoglobin 9.5, hematocrit 27.5.  CLOtest positive for H.  Pylori. BUN 21, creatinine 0.6.   MEDICATIONS AT DISCHARGE:  1.  Prevpac. The patient is to take as directed until finished and then      start Prevacid or other proton pump inhibitor once daily.  2.  Aspirin. The patient is instructed to discontinue  for 1 month  and then      start 81 mg p.o. daily.  3.  Iron 325 mg p.o. twice daily. The patient is a is instructed to wait a      few days until stools look normal prior to starting.  4.  Multivitamin 1 tablet p.o. daily.  5.  The patient was taking vitamin C which was self prescribed prior to      admission.  She is instructed to stop vitamin C supplementation for now.   DISPOSITION:  The patient will be transferred home.  She is currently in  stable condition.   FOLLOW UP:  The patient is instructed to follow up with Dr. Lina Sar on  June 25 and 9:15 a.m. She is also instructed to follow up with Dr. Sonda Primes on May 21 at 3:45 p.m. She is instructed call Dr. Posey Rea should  she notice increased weakness, dizziness, or abdominal pain.      Melissa S. Peggyann Juba, NP      Rene Paci, M.D. [facility]  Electronically Signed    MSO/MEDQ  D:  02/02/2006  T:  02/02/2006  Job:  [id number]   cc:   Lina Sar, M.D. [facility]  [address]   [place]  [place] [address]   Georgina Quint. Plotnikov, M.D. [facility]  [address]  [place]  [place] [address]

## 2011-03-17 ENCOUNTER — Ambulatory Visit (HOSPITAL_COMMUNITY)
Admission: RE | Admit: 2011-03-17 | Discharge: 2011-03-17 | Disposition: A | Discharge status: Home / Self Care | Payer: Medicare Other | Source: Ambulatory Visit | Attending: Obstetrics and Gynecology | Admitting: Obstetrics and Gynecology

## 2011-03-17 DIAGNOSIS — Z1231 Encounter for screening mammogram for malignant neoplasm of breast: Secondary | ICD-10-CM | POA: Insufficient documentation

## 2011-04-06 ENCOUNTER — Encounter: Payer: Medicare Other | Admitting: Internal Medicine

## 2011-05-07 ENCOUNTER — Telehealth: Payer: Self-pay | Admitting: Internal Medicine

## 2011-05-07 ENCOUNTER — Other Ambulatory Visit (INDEPENDENT_AMBULATORY_CARE_PROVIDER_SITE_OTHER): Payer: Medicare Other

## 2011-05-07 ENCOUNTER — Other Ambulatory Visit (INDEPENDENT_AMBULATORY_CARE_PROVIDER_SITE_OTHER): Payer: Medicare Other | Admitting: Internal Medicine

## 2011-05-07 ENCOUNTER — Encounter: Payer: Self-pay | Admitting: Internal Medicine

## 2011-05-07 ENCOUNTER — Ambulatory Visit (INDEPENDENT_AMBULATORY_CARE_PROVIDER_SITE_OTHER): Payer: Medicare Other | Admitting: Internal Medicine

## 2011-05-07 VITALS — BP 108/70 | HR 76 | Temp 97.9°F | Resp 16 | Ht 66.0 in | Wt 99.0 lb

## 2011-05-07 DIAGNOSIS — Z0000 Encounter for general adult medical examination without abnormal findings: Secondary | ICD-10-CM

## 2011-05-07 DIAGNOSIS — Z136 Encounter for screening for cardiovascular disorders: Secondary | ICD-10-CM

## 2011-05-07 DIAGNOSIS — Z79899 Other long term (current) drug therapy: Secondary | ICD-10-CM

## 2011-05-07 DIAGNOSIS — E875 Hyperkalemia: Secondary | ICD-10-CM

## 2011-05-07 DIAGNOSIS — Z23 Encounter for immunization: Secondary | ICD-10-CM

## 2011-05-07 LAB — CBC WITH DIFFERENTIAL/PLATELET
Basophils Relative: 0.3 % (ref 0.0–3.0)
Eosinophils Relative: 3.7 % (ref 0.0–5.0)
HCT: 42.9 % (ref 36.0–46.0)
MCV: 93.9 fl (ref 78.0–100.0)
Monocytes Absolute: 0.5 10*3/uL (ref 0.1–1.0)
Monocytes Relative: 8.9 % (ref 3.0–12.0)
Neutrophils Relative %: 58.3 % (ref 43.0–77.0)
Platelets: 284 10*3/uL (ref 150.0–400.0)
RBC: 4.57 Mil/uL (ref 3.87–5.11)
WBC: 5.4 10*3/uL (ref 4.5–10.5)

## 2011-05-07 LAB — TSH: TSH: 1.37 u[IU]/mL (ref 0.35–5.50)

## 2011-05-07 LAB — COMPREHENSIVE METABOLIC PANEL
ALT: 19 U/L (ref 0–35)
AST: 37 U/L (ref 0–37)
Alkaline Phosphatase: 72 U/L (ref 39–117)
CO2: 27 mEq/L (ref 19–32)
Creatinine, Ser: 0.6 mg/dL (ref 0.4–1.2)
GFR: 106.44 mL/min (ref >60.00)
Sodium: 138 mEq/L (ref 135–145)
Total Bilirubin: 0.8 mg/dL (ref 0.3–1.2)
Total Protein: 7.1 g/dL (ref 6.0–8.3)

## 2011-05-07 LAB — LDL CHOLESTEROL, DIRECT: Direct LDL: 112.4 mg/dL

## 2011-05-07 LAB — URINALYSIS, ROUTINE W REFLEX MICROSCOPIC
Bilirubin Urine: NEGATIVE
Ketones, ur: NEGATIVE
Leukocytes, UA: NEGATIVE
Specific Gravity, Urine: 1.025 (ref 1.000–1.030)
Total Protein, Urine: NEGATIVE
Urine Glucose: NEGATIVE
pH: 5.5 (ref 5.0–8.0)

## 2011-05-07 LAB — LIPID PANEL
Cholesterol: 223 mg/dL — ABNORMAL HIGH (ref 0–200)
Total CHOL/HDL Ratio: 2
Triglycerides: 35 mg/dL (ref 0.0–149.0)
VLDL: 7 mg/dL (ref 0.0–40.0)

## 2011-05-07 NOTE — Telephone Encounter (Signed)
[name], please, inform patient that all labs are normal except for elev K - likely a lab error. We can recheck in 1 mo. Chol is a little elevated due to a very high level of HDL (good) cholesterol.   Please, mail the labs to the patient. Thx

## 2011-05-07 NOTE — Progress Notes (Signed)
  Subjective:    Patient ID: [name], female    DOB: 11-15-1945, 65 y.o.   MRN: [id number]  HPI The patient is here for a wellness exam. The patient has been doing well overall without major physical or psychological issues going on lately.  Review of Systems  Constitutional: Negative for fever, chills, diaphoresis, activity change, appetite change, fatigue and unexpected weight change.  HENT: Negative for hearing loss, ear pain, congestion, sore throat, sneezing, mouth sores, neck pain, dental problem, voice change, postnasal drip and sinus pressure.   Eyes: Negative for pain and visual disturbance.  Respiratory: Negative for cough, chest tightness, wheezing and stridor.   Cardiovascular: Negative for chest pain, palpitations and leg swelling.  Gastrointestinal: Negative for nausea, vomiting, abdominal pain, blood in stool, abdominal distention and rectal pain.  Genitourinary: Negative for dysuria, hematuria, decreased urine volume, vaginal bleeding, vaginal discharge, difficulty urinating, vaginal pain and menstrual problem.  Musculoskeletal: Negative for back pain, joint swelling and gait problem.  Skin: Negative for color change, rash and wound.  Neurological: Negative for dizziness, tremors, syncope, speech difficulty and light-headedness.  Hematological: Negative for adenopathy.  Psychiatric/Behavioral: Negative for suicidal ideas, hallucinations, behavioral problems, confusion, sleep disturbance, dysphoric mood and decreased concentration. The patient is not hyperactive.        Objective:   Physical Exam  Constitutional: She appears well-developed and well-nourished. No distress.  HENT:  Head: Normocephalic.  Right Ear: External ear normal.  Left Ear: External ear normal.  Nose: Nose normal.  Mouth/Throat: Oropharynx is clear and moist.  Eyes: Conjunctivae are normal. Pupils are equal, round, and reactive to light. Right eye exhibits no discharge. Left eye exhibits no  discharge.  Neck: Normal range of motion. Neck supple. No JVD present. No tracheal deviation present. No thyromegaly present.  Cardiovascular: Normal rate, regular rhythm and normal heart sounds.   Pulmonary/Chest: No stridor. No respiratory distress. She has no wheezes.  Abdominal: Soft. Bowel sounds are normal. She exhibits no distension and no mass. There is no tenderness. There is no rebound and no guarding.  Musculoskeletal: She exhibits no edema and no tenderness.  Lymphadenopathy:    She has no cervical adenopathy.  Neurological: She displays normal reflexes. No cranial nerve deficit. She exhibits normal muscle tone. Coordination normal.  Skin: No rash noted. No erythema.  Psychiatric: She has a normal mood and affect. Her behavior is normal. Judgment and thought content normal.    Lab Results  Component Value Date   WBC 5.4 05/07/2011   HGB 14.2 05/07/2011   HCT 42.9 05/07/2011   PLT 284.0 05/07/2011   CHOL 223* 05/07/2011   TRIG 35.0 05/07/2011   HDL 104.50 05/07/2011   LDLDIRECT 112.4 05/07/2011   ALT 19 05/07/2011   AST 37 05/07/2011   NA 138 05/07/2011   K 5.6* 05/07/2011   CL 102 05/07/2011   CREATININE 0.6 05/07/2011   BUN 20 05/07/2011   CO2 27 05/07/2011   TSH 1.37 05/07/2011         Assessment & Plan:

## 2011-05-08 NOTE — Telephone Encounter (Signed)
Addended by: Merrilyn Puma on: 05/08/2011 04:49 PM   Modules accepted: Orders

## 2011-05-08 NOTE — Telephone Encounter (Signed)
Left a detailed mess advising pt of below. Lab order entered for BMET recheck in 1 mo.

## 2011-05-17 NOTE — Assessment & Plan Note (Signed)
Repeat in 12 mo 

## 2011-06-08 ENCOUNTER — Other Ambulatory Visit (INDEPENDENT_AMBULATORY_CARE_PROVIDER_SITE_OTHER): Payer: Medicare Other

## 2011-06-08 DIAGNOSIS — E875 Hyperkalemia: Secondary | ICD-10-CM

## 2011-06-08 LAB — BASIC METABOLIC PANEL
BUN: 12 mg/dL (ref 6–23)
Calcium: 9.4 mg/dL (ref 8.4–10.5)
Creatinine, Ser: 0.6 mg/dL (ref 0.4–1.2)
GFR: 100.59 mL/min (ref >60.00)
Potassium: 4.2 mEq/L (ref 3.5–5.1)

## 2011-07-06 ENCOUNTER — Telehealth: Payer: Self-pay | Admitting: *Deleted

## 2011-07-06 NOTE — Telephone Encounter (Signed)
Patient requesting results of last potassium.

## 2011-07-06 NOTE — Telephone Encounter (Signed)
Left pt VM on hm #

## 2011-07-14 DIAGNOSIS — L989 Disorder of the skin and subcutaneous tissue, unspecified: Secondary | ICD-10-CM | POA: Insufficient documentation

## 2011-07-14 DIAGNOSIS — IMO0002 Reserved for concepts with insufficient information to code with codable children: Secondary | ICD-10-CM | POA: Insufficient documentation

## 2011-07-14 DIAGNOSIS — N952 Postmenopausal atrophic vaginitis: Secondary | ICD-10-CM | POA: Insufficient documentation

## 2011-07-16 ENCOUNTER — Ambulatory Visit (INDEPENDENT_AMBULATORY_CARE_PROVIDER_SITE_OTHER): Payer: Medicare Other | Admitting: *Deleted

## 2011-07-16 DIAGNOSIS — Z23 Encounter for immunization: Secondary | ICD-10-CM

## 2011-07-22 ENCOUNTER — Other Ambulatory Visit (HOSPITAL_COMMUNITY)
Admission: RE | Admit: 2011-07-22 | Discharge: 2011-07-22 | Disposition: A | Discharge status: Home / Self Care | Payer: Medicare Other | Source: Ambulatory Visit | Attending: Obstetrics and Gynecology | Admitting: Obstetrics and Gynecology

## 2011-07-22 ENCOUNTER — Ambulatory Visit (INDEPENDENT_AMBULATORY_CARE_PROVIDER_SITE_OTHER): Payer: Medicare Other | Admitting: Obstetrics and Gynecology

## 2011-07-22 ENCOUNTER — Encounter: Payer: Self-pay | Admitting: Obstetrics and Gynecology

## 2011-07-22 VITALS — BP 114/64 | Ht 65.0 in | Wt 100.0 lb

## 2011-07-22 DIAGNOSIS — M899 Disorder of bone, unspecified: Secondary | ICD-10-CM

## 2011-07-22 DIAGNOSIS — M8580 Other specified disorders of bone density and structure, unspecified site: Secondary | ICD-10-CM

## 2011-07-22 DIAGNOSIS — Z124 Encounter for screening for malignant neoplasm of cervix: Secondary | ICD-10-CM

## 2011-07-22 DIAGNOSIS — N951 Menopausal and female climacteric states: Secondary | ICD-10-CM

## 2011-07-22 DIAGNOSIS — N952 Postmenopausal atrophic vaginitis: Secondary | ICD-10-CM

## 2011-07-22 DIAGNOSIS — Z780 Asymptomatic menopausal state: Secondary | ICD-10-CM

## 2011-07-22 NOTE — Progress Notes (Signed)
Patient came back to see me today for further followup. The first thing we discussed with her vaginal dryness. She has previously been on Vagifem but is currently not on it. She says they are doing fine with intercourse without it. Her menopausal symptoms have resolved. She does have osteopenia and is a thin white female not on estrogen. Her last bone density was in 2008. She has had no fractures. She takes calcium and vitamin D. She is up-to-date on mammograms. She is having no vaginal bleeding or pelvic pain.  ROS: 12 system review done. Pertinent GYN positives and negatives are above. No other changes in other systems. Please also see problem list.  Physical examination: Kennon Portela present. HEENT within normal limits. Neck: Thyroid not large. No masses. Supraclavicular nodes: not enlarged. Breasts: Examined in both sitting midline position. No skin changes and no masses. Abdomen: Soft no guarding rebound or masses or hernia. Pelvic: External: Within normal limits. BUS: Within normal limits. Vaginal:within normal limits. Good estrogen effect. No evidence of cystocele rectocele or enterocele. Cervix: clean. Uterus: Normal size and shape. Adnexa: No masses. Rectovaginal exam: Confirmatory and negative. Extremities: Within normal limits.  Assessment: #1. Osteopenia #2. Atrophic vaginitis #3. Menopausal symptoms  Plan: Scheduled bone density. Continue yearly mammograms. Lab work to her PCP.

## 2011-08-10 ENCOUNTER — Telehealth: Payer: Self-pay | Admitting: Internal Medicine

## 2011-08-10 NOTE — Telephone Encounter (Signed)
Patient is being treated Dr Dr Shelle Iron at [facility] for a frozen shoulder.  She has been prescribed a 21 day prednisone taper.  She has been instructed to contact our office and make sure it is ok for her to take due to her history of Gastric Ulcer.  Per office note in centricity from 03/21/2007 patient with a history of GU bleed from gastric ulcer.  She has not been in the office since.  Please advise.  She reports no current Gi problems

## 2011-08-11 NOTE — Telephone Encounter (Signed)
I have left her a message with a detailed message.  She is asked to call back with any questions

## 2011-08-11 NOTE — Telephone Encounter (Signed)
I recommend a daily PPI while on the prednisone - Prilosec OTC or equivalent should be fine

## 2011-08-18 ENCOUNTER — Ambulatory Visit: Payer: Medicare Other | Attending: Specialist | Admitting: Physical Therapy

## 2011-08-18 DIAGNOSIS — M25519 Pain in unspecified shoulder: Secondary | ICD-10-CM | POA: Insufficient documentation

## 2011-08-18 DIAGNOSIS — M25619 Stiffness of unspecified shoulder, not elsewhere classified: Secondary | ICD-10-CM | POA: Insufficient documentation

## 2011-08-18 DIAGNOSIS — IMO0001 Reserved for inherently not codable concepts without codable children: Secondary | ICD-10-CM | POA: Insufficient documentation

## 2011-08-20 ENCOUNTER — Ambulatory Visit: Payer: Medicare Other | Admitting: Physical Therapy

## 2011-08-24 ENCOUNTER — Ambulatory Visit: Payer: Medicare Other | Attending: Specialist | Admitting: Physical Therapy

## 2011-08-24 DIAGNOSIS — M25519 Pain in unspecified shoulder: Secondary | ICD-10-CM | POA: Insufficient documentation

## 2011-08-24 DIAGNOSIS — M25619 Stiffness of unspecified shoulder, not elsewhere classified: Secondary | ICD-10-CM | POA: Insufficient documentation

## 2011-08-24 DIAGNOSIS — IMO0001 Reserved for inherently not codable concepts without codable children: Secondary | ICD-10-CM | POA: Insufficient documentation

## 2011-08-26 ENCOUNTER — Ambulatory Visit: Payer: Medicare Other | Admitting: Physical Therapy

## 2011-08-31 ENCOUNTER — Ambulatory Visit: Payer: Medicare Other | Admitting: Physical Therapy

## 2011-09-02 ENCOUNTER — Ambulatory Visit: Payer: Medicare Other | Admitting: Physical Therapy

## 2012-03-04 ENCOUNTER — Other Ambulatory Visit: Payer: Self-pay | Admitting: Obstetrics and Gynecology

## 2012-03-04 DIAGNOSIS — Z1231 Encounter for screening mammogram for malignant neoplasm of breast: Secondary | ICD-10-CM

## 2012-03-29 ENCOUNTER — Ambulatory Visit (HOSPITAL_COMMUNITY)
Admission: RE | Admit: 2012-03-29 | Discharge: 2012-03-29 | Disposition: A | Discharge status: Home / Self Care | Payer: Medicare Other | Source: Ambulatory Visit | Attending: Obstetrics and Gynecology | Admitting: Obstetrics and Gynecology

## 2012-03-29 DIAGNOSIS — Z1231 Encounter for screening mammogram for malignant neoplasm of breast: Secondary | ICD-10-CM | POA: Insufficient documentation

## 2012-05-17 ENCOUNTER — Ambulatory Visit (INDEPENDENT_AMBULATORY_CARE_PROVIDER_SITE_OTHER): Payer: Medicare Other

## 2012-05-17 DIAGNOSIS — M949 Disorder of cartilage, unspecified: Secondary | ICD-10-CM

## 2012-05-17 DIAGNOSIS — M8580 Other specified disorders of bone density and structure, unspecified site: Secondary | ICD-10-CM

## 2012-05-19 NOTE — Progress Notes (Unsigned)
yes

## 2012-06-17 ENCOUNTER — Ambulatory Visit: Payer: Medicare Other | Admitting: Obstetrics and Gynecology

## 2012-07-05 ENCOUNTER — Ambulatory Visit (INDEPENDENT_AMBULATORY_CARE_PROVIDER_SITE_OTHER): Payer: Medicare Other | Admitting: *Deleted

## 2012-07-05 DIAGNOSIS — Z23 Encounter for immunization: Secondary | ICD-10-CM

## 2012-07-29 ENCOUNTER — Encounter: Payer: Medicare Other | Admitting: Obstetrics and Gynecology

## 2012-08-01 ENCOUNTER — Ambulatory Visit (INDEPENDENT_AMBULATORY_CARE_PROVIDER_SITE_OTHER): Payer: Medicare Other | Admitting: Obstetrics and Gynecology

## 2012-08-01 ENCOUNTER — Encounter: Payer: Self-pay | Admitting: Obstetrics and Gynecology

## 2012-08-01 VITALS — BP 120/68 | Ht 65.5 in | Wt 100.0 lb

## 2012-08-01 DIAGNOSIS — N898 Other specified noninflammatory disorders of vagina: Secondary | ICD-10-CM

## 2012-08-01 DIAGNOSIS — M899 Disorder of bone, unspecified: Secondary | ICD-10-CM

## 2012-08-01 DIAGNOSIS — IMO0002 Reserved for concepts with insufficient information to code with codable children: Secondary | ICD-10-CM

## 2012-08-01 DIAGNOSIS — M8580 Other specified disorders of bone density and structure, unspecified site: Secondary | ICD-10-CM

## 2012-08-01 DIAGNOSIS — N952 Postmenopausal atrophic vaginitis: Secondary | ICD-10-CM

## 2012-08-01 LAB — WET PREP FOR TRICH, YEAST, CLUE
Clue Cells Wet Prep HPF POC: NONE SEEN
Trich, Wet Prep: NONE SEEN
Yeast Wet Prep HPF POC: NONE SEEN

## 2012-08-01 NOTE — Progress Notes (Signed)
Patient came to see me for further follow up. The first thing we discussed was her osteopenia. She still has just osteopenia but has lost  significant bone in both her spine and her hip from 2008 to  2013. She has never had her fragility fracture. She took Fosamax for less than one year and had terrible reflux. She took Evista for less than one year and had a drug rash. She has had some lab work done by her PCP which I reviewed. She is having vaginal discharge. It is intermittent. She does have dyspareunia due to atrophic vaginitis but does not want to try vaginal estrogen. They do not have intercourse frequently due to his prostate problems. She is up-to-date on mammograms. She is due for her colonoscopy next year. She has always had normal Pap smears. Her last Pap smear was 2012. She is having no vaginal bleeding or pelvic pain.  ROS: 12 system review done. Pertinent positives above. Other positives include peptic ulcer and migraines.  Physical examination:Kim Julian Reil present.HEENT within normal limits. Neck: Thyroid not large. No masses. Supraclavicular nodes: not enlarged. Breasts: Examined in both sitting and lying  position. No skin changes and no masses. Abdomen: Soft no guarding rebound or masses or hernia. Pelvic: External: Within normal limits. BUS: Within normal limits. Vaginal:within normal limits. Poor estrogen effect. No evidence of cystocele rectocele or enterocele. Cervix: clean. Uterus: Normal size and shape. Adnexa: No masses. Rectovaginal exam: Confirmatory and negative. Extremities: Within normal limits.  Wet prep negative.  Assessment: #1. Worsening osteopenia #2. Atrophic vaginitis #3. Dyspareunia #4. Vaginal discharge due to atrophic vaginitis  Plan: Continue yearly mammograms. We had a long discussion about her osteopenia. Lab work drawn. Effort her atelvia  if lab work normal. Hyalogyn gel. Pap not done.The new Pap smear guidelines were discussed with the patient. Discussed  BRCA1 and BRCA2 testing. She will have her cousins tested.

## 2012-08-01 NOTE — Patient Instructions (Signed)
Ask Your cousins to be tested for BRCA1 and BRCA2.

## 2012-08-02 LAB — PTH, INTACT AND CALCIUM: Calcium, Total (PTH): 9.9 mg/dL (ref 8.4–10.5)

## 2012-08-12 ENCOUNTER — Encounter: Payer: Self-pay | Admitting: Internal Medicine

## 2012-08-12 ENCOUNTER — Ambulatory Visit (INDEPENDENT_AMBULATORY_CARE_PROVIDER_SITE_OTHER): Payer: Medicare Other | Admitting: Internal Medicine

## 2012-08-12 VITALS — BP 118/62 | HR 72 | Temp 98.1°F | Resp 16 | Ht 65.5 in | Wt 101.0 lb

## 2012-08-12 DIAGNOSIS — Z136 Encounter for screening for cardiovascular disorders: Secondary | ICD-10-CM

## 2012-08-12 DIAGNOSIS — Z0000 Encounter for general adult medical examination without abnormal findings: Secondary | ICD-10-CM

## 2012-08-12 NOTE — Progress Notes (Signed)
Patient ID: [name], female   DOB: July 19, 1946, 66 y.o.   MRN: [id number]  Subjective:    Patient ID: [name], female    DOB: 1946-08-31, 66 y.o.   MRN: [id number]  HPI The patient is here for a wellness exam. The patient has been doing well overall without major physical or psychological issues going on lately.  Wt Readings from Last 3 Encounters:  08/12/12 101 lb (45.813 kg)  08/01/12 100 lb (45.36 kg)  07/22/11 100 lb (45.36 kg)   BP Readings from Last 3 Encounters:  08/12/12 118/62  08/01/12 120/68  07/22/11 114/64      Review of Systems  Constitutional: Negative for fever, chills, diaphoresis, activity change, appetite change, fatigue and unexpected weight change.  HENT: Negative for hearing loss, ear pain, congestion, sore throat, sneezing, mouth sores, neck pain, dental problem, voice change, postnasal drip and sinus pressure.   Eyes: Negative for pain and visual disturbance.  Respiratory: Negative for cough, chest tightness, wheezing and stridor.   Cardiovascular: Negative for chest pain, palpitations and leg swelling.  Gastrointestinal: Negative for nausea, vomiting, abdominal pain, blood in stool, abdominal distention and rectal pain.  Genitourinary: Negative for dysuria, hematuria, decreased urine volume, vaginal bleeding, vaginal discharge, difficulty urinating, vaginal pain and menstrual problem.  Musculoskeletal: Negative for back pain, joint swelling and gait problem.  Skin: Negative for color change, rash and wound.  Neurological: Negative for dizziness, tremors, syncope, speech difficulty and light-headedness.  Hematological: Negative for adenopathy.  Psychiatric/Behavioral: Negative for suicidal ideas, hallucinations, behavioral problems, confusion, sleep disturbance, dysphoric mood and decreased concentration. The patient is not hyperactive.        Objective:   Physical Exam  Constitutional: She appears well-developed and well-nourished. No  distress.  HENT:  Head: Normocephalic.  Right Ear: External ear normal.  Left Ear: External ear normal.  Nose: Nose normal.  Mouth/Throat: Oropharynx is clear and moist.  Eyes: Conjunctivae are normal. Pupils are equal, round, and reactive to light. Right eye exhibits no discharge. Left eye exhibits no discharge.  Neck: Normal range of motion. Neck supple. No JVD present. No tracheal deviation present. No thyromegaly present.  Cardiovascular: Normal rate, regular rhythm and normal heart sounds.   Pulmonary/Chest: No stridor. No respiratory distress. She has no wheezes.  Abdominal: Soft. Bowel sounds are normal. She exhibits no distension and no mass. There is no tenderness. There is no rebound and no guarding.  Musculoskeletal: She exhibits no edema and no tenderness.  Lymphadenopathy:    She has no cervical adenopathy.  Neurological: She displays normal reflexes. No cranial nerve deficit. She exhibits normal muscle tone. Coordination normal.  Skin: No rash noted. No erythema.  Psychiatric: She has a normal mood and affect. Her behavior is normal. Judgment and thought content normal.    Lab Results  Component Value Date   WBC 5.4 05/07/2011   HGB 14.2 05/07/2011   HCT 42.9 05/07/2011   PLT 284.0 05/07/2011   GLUCOSE 85 06/08/2011   CHOL 223* 05/07/2011   TRIG 35.0 05/07/2011   HDL 104.50 05/07/2011   LDLDIRECT 112.4 05/07/2011   ALT 19 05/07/2011   AST 37 05/07/2011   NA 140 06/08/2011   K 4.2 06/08/2011   CL 104 06/08/2011   CREATININE 0.6 06/08/2011   BUN 12 06/08/2011   CO2 31 06/08/2011   TSH 1.37 05/07/2011          Assessment & Plan:

## 2012-08-12 NOTE — Assessment & Plan Note (Addendum)
We discussed age appropriate health related issues, including available/recomended screening tests and vaccinations. We discussed a need for adhering to healthy diet and exercise. Labs/EKG were reviewed/ordered. All questions were answered.  Colonosc due in 2014

## 2012-08-13 ENCOUNTER — Encounter: Payer: Self-pay | Admitting: Internal Medicine

## 2012-08-15 ENCOUNTER — Other Ambulatory Visit (INDEPENDENT_AMBULATORY_CARE_PROVIDER_SITE_OTHER): Payer: Medicare Other

## 2012-08-15 DIAGNOSIS — Z0000 Encounter for general adult medical examination without abnormal findings: Secondary | ICD-10-CM

## 2012-08-15 DIAGNOSIS — Z79899 Other long term (current) drug therapy: Secondary | ICD-10-CM

## 2012-08-15 LAB — CBC WITH DIFFERENTIAL/PLATELET
Basophils Relative: 0.5 % (ref 0.0–3.0)
Eosinophils Relative: 5.9 % — ABNORMAL HIGH (ref 0.0–5.0)
Hemoglobin: 14 g/dL (ref 12.0–15.0)
Lymphocytes Relative: 33.4 % (ref 12.0–46.0)
MCHC: 33 g/dL (ref 30.0–36.0)
Monocytes Relative: 10.4 % (ref 3.0–12.0)
Neutro Abs: 2.6 10*3/uL (ref 1.4–7.7)
RBC: 4.51 Mil/uL (ref 3.87–5.11)

## 2012-08-15 LAB — BASIC METABOLIC PANEL
CO2: 29 mEq/L (ref 19–32)
Calcium: 9.3 mg/dL (ref 8.4–10.5)
GFR: 98.42 mL/min (ref >60.00)
Sodium: 138 mEq/L (ref 135–145)

## 2012-08-15 LAB — HEPATIC FUNCTION PANEL
ALT: 18 U/L (ref 0–35)
AST: 35 U/L (ref 0–37)
Bilirubin, Direct: 0.2 mg/dL (ref 0.0–0.3)
Total Bilirubin: 0.9 mg/dL (ref 0.3–1.2)

## 2012-08-15 LAB — URINALYSIS
Bilirubin Urine: NEGATIVE
Ketones, ur: NEGATIVE
Nitrite: NEGATIVE
pH: 6 (ref 5.0–8.0)

## 2012-08-15 LAB — LIPID PANEL
Total CHOL/HDL Ratio: 2
Triglycerides: 50 mg/dL (ref 0.0–149.0)

## 2012-10-04 ENCOUNTER — Encounter: Payer: Self-pay | Admitting: Internal Medicine

## 2012-10-04 ENCOUNTER — Ambulatory Visit (INDEPENDENT_AMBULATORY_CARE_PROVIDER_SITE_OTHER): Payer: Medicare Other | Admitting: Internal Medicine

## 2012-10-04 ENCOUNTER — Ambulatory Visit (INDEPENDENT_AMBULATORY_CARE_PROVIDER_SITE_OTHER)
Admission: RE | Admit: 2012-10-04 | Discharge: 2012-10-04 | Disposition: A | Discharge status: Home / Self Care | Payer: Medicare Other | Source: Ambulatory Visit | Attending: Internal Medicine | Admitting: Internal Medicine

## 2012-10-04 VITALS — BP 122/80 | HR 80 | Temp 97.1°F | Resp 16 | Wt 98.0 lb

## 2012-10-04 DIAGNOSIS — R5381 Other malaise: Secondary | ICD-10-CM

## 2012-10-04 DIAGNOSIS — R5383 Other fatigue: Secondary | ICD-10-CM

## 2012-10-04 DIAGNOSIS — R05 Cough: Secondary | ICD-10-CM | POA: Insufficient documentation

## 2012-10-04 DIAGNOSIS — R053 Chronic cough: Secondary | ICD-10-CM | POA: Insufficient documentation

## 2012-10-04 MED ORDER — AZITHROMYCIN 250 MG PO TABS: ORAL_TABLET | ORAL | Status: DC

## 2012-10-04 MED ORDER — PROMETHAZINE-CODEINE 6.25-10 MG/5ML PO SYRP: 5.0000 mL | ORAL_SOLUTION | ORAL | Status: DC | PRN

## 2012-10-04 NOTE — Assessment & Plan Note (Signed)
CXR

## 2012-10-04 NOTE — Progress Notes (Signed)
  Subjective:    Patient ID: [name], female    DOB: 11-Apr-1946, 67 y.o.   MRN: [id number]  C/o dry cough, weakness x 1 wk. Cough is worse at night  Wt Readings from Last 3 Encounters:  10/04/12 98 lb (44.453 kg)  08/12/12 101 lb (45.813 kg)  08/01/12 100 lb (45.36 kg)   BP Readings from Last 3 Encounters:  10/04/12 122/80  08/12/12 118/62  08/01/12 120/68      Review of Systems  Constitutional: Negative for fever, chills, diaphoresis, activity change, appetite change, fatigue and unexpected weight change.  HENT: Negative for hearing loss, ear pain, congestion, sore throat, sneezing, mouth sores, neck pain, dental problem, voice change, postnasal drip and sinus pressure.   Eyes: Negative for pain and visual disturbance.  Respiratory: Negative for cough, chest tightness, wheezing and stridor.   Cardiovascular: Negative for chest pain, palpitations and leg swelling.  Gastrointestinal: Negative for nausea, vomiting, abdominal pain, blood in stool, abdominal distention and rectal pain.  Genitourinary: Negative for dysuria, hematuria, decreased urine volume, vaginal bleeding, vaginal discharge, difficulty urinating, vaginal pain and menstrual problem.  Musculoskeletal: Negative for back pain, joint swelling and gait problem.  Skin: Negative for color change, rash and wound.  Neurological: Negative for dizziness, tremors, syncope, speech difficulty and light-headedness.  Hematological: Negative for adenopathy.  Psychiatric/Behavioral: Negative for suicidal ideas, hallucinations, behavioral problems, confusion, sleep disturbance, dysphoric mood and decreased concentration. The patient is not hyperactive.        Objective:   Physical Exam  Constitutional: She appears well-developed and well-nourished. No distress.  HENT:  Head: Normocephalic.  Right Ear: External ear normal.  Left Ear: External ear normal.  Nose: Nose normal.  Mouth/Throat: Oropharynx is clear and moist.    Eyes: Conjunctivae are normal. Pupils are equal, round, and reactive to light. Right eye exhibits no discharge. Left eye exhibits no discharge.  Neck: Normal range of motion. Neck supple. No JVD present. No tracheal deviation present. No thyromegaly present.  Cardiovascular: Normal rate, regular rhythm and normal heart sounds.   Pulmonary/Chest: No stridor. No respiratory distress. She has no wheezes.  Abdominal: Soft. Bowel sounds are normal. She exhibits no distension and no mass. There is no tenderness. There is no rebound and no guarding.  Musculoskeletal: She exhibits no edema and no tenderness.  Lymphadenopathy:    She has no cervical adenopathy.  Neurological: She displays normal reflexes. No cranial nerve deficit. She exhibits normal muscle tone. Coordination normal.  Skin: No rash noted. No erythema.  Psychiatric: She has a normal mood and affect. Her behavior is normal. Judgment and thought content normal.    Lab Results  Component Value Date   WBC 5.1 08/15/2012   HGB 14.0 08/15/2012   HCT 42.4 08/15/2012   PLT 269.0 08/15/2012   GLUCOSE 94 08/15/2012   CHOL 215* 08/15/2012   TRIG 50.0 08/15/2012   HDL 95.90 08/15/2012   LDLDIRECT 107.6 08/15/2012   ALT 18 08/15/2012   AST 35 08/15/2012   NA 138 08/15/2012   K 4.5 08/15/2012   CL 103 08/15/2012   CREATININE 0.6 08/15/2012   BUN 17 08/15/2012   CO2 29 08/15/2012   TSH 3.27 08/15/2012          Assessment & Plan:

## 2012-10-04 NOTE — Assessment & Plan Note (Signed)
Z pac CXR Prom-cod 

## 2012-10-06 ENCOUNTER — Telehealth: Payer: Self-pay | Admitting: *Deleted

## 2012-10-06 NOTE — Telephone Encounter (Signed)
Message copied by Merrilyn Puma on Thu Oct 06, 2012  8:36 AM ------      Message from: Tresa Garter      Created: Tue Oct 04, 2012 11:12 PM       Misty Stanley, please, inform patient that all labs are OK      Thank you!

## 2012-10-06 NOTE — Telephone Encounter (Signed)
Left detailed mess informing pt of below.  

## 2012-11-07 ENCOUNTER — Telehealth: Payer: Self-pay

## 2012-11-07 NOTE — Telephone Encounter (Signed)
Spoke to patient and informed her that Dr. Stan Head has changed her colonoscopy recall to 2019.  Patient was happy to hear this.  Date changed in computer.

## 2012-11-18 ENCOUNTER — Encounter: Payer: Self-pay | Admitting: Internal Medicine

## 2013-03-02 ENCOUNTER — Other Ambulatory Visit (HOSPITAL_COMMUNITY): Payer: Self-pay | Admitting: Radiology

## 2013-03-02 ENCOUNTER — Other Ambulatory Visit: Payer: Self-pay | Admitting: Gynecology

## 2013-03-02 DIAGNOSIS — Z1231 Encounter for screening mammogram for malignant neoplasm of breast: Secondary | ICD-10-CM

## 2013-03-30 ENCOUNTER — Ambulatory Visit (HOSPITAL_COMMUNITY)
Admission: RE | Admit: 2013-03-30 | Discharge: 2013-03-30 | Disposition: A | Discharge status: Home / Self Care | Payer: Medicare Other | Source: Ambulatory Visit | Attending: Radiology | Admitting: Radiology

## 2013-03-30 DIAGNOSIS — Z1231 Encounter for screening mammogram for malignant neoplasm of breast: Secondary | ICD-10-CM

## 2013-06-20 ENCOUNTER — Ambulatory Visit (INDEPENDENT_AMBULATORY_CARE_PROVIDER_SITE_OTHER): Payer: Medicare Other

## 2013-06-20 DIAGNOSIS — Z23 Encounter for immunization: Secondary | ICD-10-CM

## 2013-08-02 ENCOUNTER — Ambulatory Visit (INDEPENDENT_AMBULATORY_CARE_PROVIDER_SITE_OTHER): Payer: Medicare Other | Admitting: Gynecology

## 2013-08-02 ENCOUNTER — Encounter: Payer: Self-pay | Admitting: Gynecology

## 2013-08-02 VITALS — BP 120/74 | Ht 66.0 in | Wt 99.0 lb

## 2013-08-02 DIAGNOSIS — M8580 Other specified disorders of bone density and structure, unspecified site: Secondary | ICD-10-CM

## 2013-08-02 DIAGNOSIS — M899 Disorder of bone, unspecified: Secondary | ICD-10-CM

## 2013-08-02 DIAGNOSIS — N952 Postmenopausal atrophic vaginitis: Secondary | ICD-10-CM

## 2013-08-02 NOTE — Patient Instructions (Signed)
followup one year for annual exam

## 2013-08-02 NOTE — Progress Notes (Signed)
[name] 05/31/1946 [id number]        67 y.o.  G0P0 for followup exam.  Former patient of Dr. Eda Paschal. Several issues noted below.  Past medical history,surgical history, problem list, medications, allergies, family history and social history were all reviewed and documented in the EPIC chart.  ROS:  Performed and pertinent positives and negatives are included in the history, assessment and plan .  Exam: Kim assistant Filed Vitals:   08/02/13 0941  BP: 120/74  Height: 5\' 6"  (1.676 m)  Weight: 99 lb (44.906 kg)   General appearance  Normal Skin grossly normal Head/Neck normal with no cervical or supraclavicular adenopathy thyroid normal Lungs  clear Cardiac RR, without RMG Abdominal  soft, nontender, without masses, organomegaly or hernia Breasts  examined lying and sitting without masses, retractions, discharge or axillary adenopathy. Pelvic  Ext/BUS/vagina  normal with atrophic changes.  Cervix  normal with atrophic changes  Uterus  anteverted, normal size, shape and contour, midline and mobile nontender   Adnexa  Without masses or tenderness    Anus and perineum  normal   Rectovaginal  normal sphincter tone without palpated masses or tenderness.    Assessment/Plan:  67 y.o. G0P0 female for annual exam.   1. Postmenopausal/atrophic genital changes. Patient having some vaginal dryness and dyspareunia. Had been on HRT in the past with unacceptable side effects. Also it probably Vagifem per Dr. Verl Dicker note. On nothing at this time. Using OTC lubricants. Options for vaginal estrogen/Osphena reviewed and rejected. Patient would prefer just to use OTC products. Otherwise doing well without significant hot flushes or night sweats. No vaginal bleeding. Patient knows to report any vaginal bleeding. 2. Osteopenia. DEXA 04/2012 with T score -1.7 FRAX 12%/4.2%. Had discussed treatment options with Dr. Eda Paschal with initial plan to use Atelvia due to loss at the hip with an  increased greater than 3% FRAX (see 08/01/2012 note). She subsequently discussed this with Dr. Posey Rea and had decided against any treatment at this time. She is taking extra calcium vitamin D with a normal value of 59 last year. She does exercise routinely. The issues of fracture risk reviewed. Alternative treatment options to include Prolia discussed. She had been on oral bisphosphonates with unacceptable GI side effects. Had tried Evista but had a rash attributed to this. At this point patient rejects any treatment and we'll plan to repeat her bone density in one year. 3. Mammography 03/2013. Continue annual mammography. SBE monthly reviewed. Does have a history of mother and grandmother with breast cancer postmenopausal and several maternal cousins. She relates two cousins check for BRCA testing and were negative. 4. Pap smear 2012. No Pap smear done today. Plan repeat Pap smear next year a 3 year interval. Options to stop screening altogether or less frequent screening intervals reviewed it she is over the age of 41. Will rediscuss on an annual basis. 5. Colonoscopy 2009. She checked and they told her to recheck at a 10 year interval. 6. Health maintenance. No blood work done as this is all done through Dr. Loren Racer office. Followup one year, sooner as needed.   Note: This document was prepared with digital dictation and possible smart phrase technology. Any transcriptional errors that result from this process are unintentional.   Dara Lords MD, 10:32 AM 08/02/2013

## 2013-08-03 LAB — URINALYSIS W MICROSCOPIC + REFLEX CULTURE
Casts: NONE SEEN
Crystals: NONE SEEN
Leukocytes, UA: NEGATIVE
Nitrite: NEGATIVE
Specific Gravity, Urine: <1.005 — ABNORMAL LOW (ref 1.005–1.030)
Squamous Epithelial / LPF: NONE SEEN
pH: 6 (ref 5.0–8.0)

## 2013-08-07 ENCOUNTER — Encounter: Payer: Self-pay | Admitting: Family Medicine

## 2013-08-07 ENCOUNTER — Ambulatory Visit (INDEPENDENT_AMBULATORY_CARE_PROVIDER_SITE_OTHER): Payer: Medicare Other | Admitting: Family Medicine

## 2013-08-07 ENCOUNTER — Other Ambulatory Visit (INDEPENDENT_AMBULATORY_CARE_PROVIDER_SITE_OTHER): Payer: Medicare Other

## 2013-08-07 VITALS — BP 110/70 | HR 74 | Wt 99.0 lb

## 2013-08-07 DIAGNOSIS — G5702 Lesion of sciatic nerve, left lower limb: Secondary | ICD-10-CM

## 2013-08-07 DIAGNOSIS — M25552 Pain in left hip: Secondary | ICD-10-CM

## 2013-08-07 DIAGNOSIS — M25559 Pain in unspecified hip: Secondary | ICD-10-CM

## 2013-08-07 DIAGNOSIS — G5700 Lesion of sciatic nerve, unspecified lower limb: Secondary | ICD-10-CM

## 2013-08-07 MED ORDER — MELOXICAM 7.5 MG PO TABS: 7.5000 mg | ORAL_TABLET | Freq: Every day | ORAL | Status: DC

## 2013-08-07 NOTE — Assessment & Plan Note (Signed)
Patient does have an appear former syndrome that is giving her some radicular symptoms with the sciatic nerve. Piriformis Syndrome  Using an anatomical model, reviewed with the patient the structures involved and how they related to diagnosis. The patient indicated understanding.   The patient was given a handout from Dr. Ailene Ards book "The Sports Medicine Patient Advisor" describing the anatomy and rehabilitation of the following condition: Piriformis Syndrome  Also given a handout with more extensive Piriformis stretching, hip flexor and abductor strengthening, ham stretching  Rec deep massage, explained self-massage with ball  Meloxicam daily for 5 days then as needed. Patient does have a history of peptic ulcer disease to warned that if stomach pains occur to discontinue immediately. Discussed over-the-counter medications that could be beneficial. Patient and will come back again in 3 weeks for further evaluation. If she continues to have pain we'll do an ultrasound of the area. Patient may need formal physical therapy at that time.

## 2013-08-07 NOTE — Progress Notes (Signed)
Pre-visit discussion using our clinic review tool. No additional management support is needed unless otherwise documented below in the visit note.  

## 2013-08-07 NOTE — Patient Instructions (Signed)
You have piriformis syndrome PIRIFORMIS SYNDROME REHAB 1. Work on pretzel stretching, shoulder back and leg draped in front. 3-5 sets, 30 sec.. 2. hip abductor rotations. standing, hip flexion and rotation outward then inward. 3 sets, 15 reps. when can do comfortably, add ankle weights starting at 2 pounds.  3. cross over stretching - shoulder back to ground, same side leg crossover. 3-5 sets for 30 min..  4. SINK STRETCH - YOU CAN DO THIS WHENEVER YOU WANT DURING THE DAY 5.  Also look at handout I am giving you  Tennis ball underneath area in buttocks - on a hard surface underneath Ice 20 minutes 2-3 times daily.  Can also massage this area with an electronic massager  Meloxicam daily for 5 days then as needed.  Stop i fit hurts your stomach.  Come back again in 3 weeks.

## 2013-08-07 NOTE — Progress Notes (Signed)
CC: Hip pain  HPI: Patient is a very pleasant 67 year old female who is complaining of left hip and buttocks pain. Patient states that this came on insidiously over the course of a couple weeks. Patient is to get worse after a long rotor. Patient states the pain seems to be more on the posterior aspect and is associated with radiculopathy. Patient states that this numbness or pain goes down the posterior aspect of her leg all the way to the plantar aspect of her foot. Patient states that this did keep her up last night in his when she decided to come in to be seen. Patient has tried changing shoes which did make any significant improvement. Patient describes the pain is more of a sharp pain in the distal ache with sitting. It is stiffly worse with sitting as well as laying down and does seem to be better with activity. Denies any fevers or chills or any abnormal weight loss. Patient rates the discomfort 5/10   Past medical, surgical, family and social history reviewed. Medications reviewed all in the electronic medical record.   Review of Systems: No headache, visual changes, nausea, vomiting, diarrhea, constipation, dizziness, abdominal pain, skin rash, fevers, chills, night sweats, weight loss, swollen lymph nodes, body aches, joint swelling, muscle aches, chest pain, shortness of breath, mood changes.   Objective:    Blood pressure 110/70, pulse 74, weight 99 lb (44.906 kg).   General: No apparent distress alert and oriented x3 mood and affect normal, dressed appropriately.  HEENT: Pupils equal, extraocular movements intact Respiratory: Patient's speak in full sentences and does not appear short of breath Cardiovascular: No lower extremity edema, non tender, no erythema Skin: Warm dry intact with no signs of infection or rash on extremities or on axial skeleton. Abdomen: Soft nontender Neuro: Cranial nerves II through XII are intact, neurovascularly intact in all extremities with 2+ DTRs and 2+  pulses. Lymph: No lymphadenopathy of posterior or anterior cervical chain or axillae bilaterally.  Gait normal with good balance and coordination.  MSK: Non tender with full range of motion and good stability and symmetric strength and tone of shoulders, elbows, wrist, , knee and ankles bilaterally.  Hip: Left ROM IR: 45 Deg, ER: 45 Deg, Flexion: 120 Deg, Extension: 100 Deg, Abduction: 45 Deg, Adduction: 45 Deg Strength IR: 5/5, ER: 5/5, Flexion: 5/5, Extension: 5/5, Abduction: 5/5, Adduction: 5/5 Pelvic alignment unremarkable to inspection and palpation. Standing hip rotation and gait without trendelenburg sign / unsteadiness. Greater trochanter without tenderness to palpation. Significant tenderness over the performance Positive pain with FABER negative FADIR. No SI joint tenderness and normal minimal SI movement. No leg length discrepancy Contralateral hip unremarkable  Impression and Recommendations:     This case required medical decision making of moderate complexity.

## 2013-08-14 ENCOUNTER — Other Ambulatory Visit (INDEPENDENT_AMBULATORY_CARE_PROVIDER_SITE_OTHER): Payer: Medicare Other

## 2013-08-14 ENCOUNTER — Ambulatory Visit (INDEPENDENT_AMBULATORY_CARE_PROVIDER_SITE_OTHER): Payer: Medicare Other | Admitting: Internal Medicine

## 2013-08-14 ENCOUNTER — Encounter: Payer: Self-pay | Admitting: Internal Medicine

## 2013-08-14 VITALS — BP 104/60 | HR 64 | Temp 98.2°F | Resp 16 | Ht 66.0 in | Wt 101.0 lb

## 2013-08-14 DIAGNOSIS — Z1211 Encounter for screening for malignant neoplasm of colon: Secondary | ICD-10-CM

## 2013-08-14 DIAGNOSIS — M545 Low back pain: Secondary | ICD-10-CM

## 2013-08-14 DIAGNOSIS — Z0000 Encounter for general adult medical examination without abnormal findings: Secondary | ICD-10-CM

## 2013-08-14 DIAGNOSIS — R5381 Other malaise: Secondary | ICD-10-CM

## 2013-08-14 DIAGNOSIS — M899 Disorder of bone, unspecified: Secondary | ICD-10-CM

## 2013-08-14 DIAGNOSIS — Z23 Encounter for immunization: Secondary | ICD-10-CM

## 2013-08-14 LAB — CBC WITH DIFFERENTIAL/PLATELET
Basophils Absolute: 0 10*3/uL (ref 0.0–0.1)
HCT: 42.3 % (ref 36.0–46.0)
Lymphs Abs: 1.7 10*3/uL (ref 0.7–4.0)
Monocytes Absolute: 0.6 10*3/uL (ref 0.1–1.0)
Monocytes Relative: 9.5 % (ref 3.0–12.0)
Platelets: 232 10*3/uL (ref 150.0–400.0)
RDW: 12.8 % (ref 11.5–14.6)

## 2013-08-14 LAB — BASIC METABOLIC PANEL
BUN: 13 mg/dL (ref 6–23)
CO2: 28 mEq/L (ref 19–32)
Calcium: 9.6 mg/dL (ref 8.4–10.5)
Chloride: 105 mEq/L (ref 96–112)
GFR: 109.93 mL/min (ref >60.00)
Glucose, Bld: 80 mg/dL (ref 70–99)
Potassium: 5.1 mEq/L (ref 3.5–5.1)
Sodium: 140 mEq/L (ref 135–145)

## 2013-08-14 LAB — URINALYSIS
Ketones, ur: NEGATIVE
Nitrite: NEGATIVE
Specific Gravity, Urine: 1.015 (ref 1.000–1.030)
Total Protein, Urine: NEGATIVE
Urine Glucose: NEGATIVE
pH: 6 (ref 5.0–8.0)

## 2013-08-14 LAB — HEPATIC FUNCTION PANEL
ALT: 18 U/L (ref 0–35)
AST: 33 U/L (ref 0–37)
Bilirubin, Direct: 0.1 mg/dL (ref 0.0–0.3)
Total Bilirubin: 0.7 mg/dL (ref 0.3–1.2)

## 2013-08-14 NOTE — Progress Notes (Signed)
Pre visit review using our clinic review tool, if applicable. No additional management support is needed unless otherwise documented below in the visit note. 

## 2013-08-14 NOTE — Assessment & Plan Note (Signed)
Continue with current prescription therapy as reflected on the Med list.  

## 2013-08-14 NOTE — Assessment & Plan Note (Signed)
We discussed age appropriate health related issues, including available/recomended screening tests and vaccinations. We discussed a need for adhering to healthy diet and exercise. Labs/EKG were reviewed/ordered. All questions were answered.  Colonosc due 2014

## 2013-08-14 NOTE — Progress Notes (Signed)
   Subjective:    Patient ID: [name], female    DOB: [birth year], 67 y.o.   MRN: [id number]  HPI The patient is here for a wellness exam. The patient has been doing well overall without major physical or psychological issues going on lately.  Wt Readings from Last 3 Encounters:  08/14/13 101 lb (45.813 kg)  08/07/13 99 lb (44.906 kg)  08/02/13 99 lb (44.906 kg)   BP Readings from Last 3 Encounters:  08/14/13 104/60  08/07/13 110/70  08/02/13 120/74      Review of Systems  Constitutional: Negative for fever, chills, diaphoresis, activity change, appetite change, fatigue and unexpected weight change.  HENT: Negative for hearing loss, ear pain, congestion, sore throat, sneezing, mouth sores, neck pain, dental problem, voice change, postnasal drip and sinus pressure.   Eyes: Negative for pain and visual disturbance.  Respiratory: Negative for cough, chest tightness, wheezing and stridor.   Cardiovascular: Negative for chest pain, palpitations and leg swelling.  Gastrointestinal: Negative for nausea, vomiting, abdominal pain, blood in stool, abdominal distention and rectal pain.  Genitourinary: Negative for dysuria, hematuria, decreased urine volume, vaginal bleeding, vaginal discharge, difficulty urinating, vaginal pain and menstrual problem.  Musculoskeletal: Negative for back pain, joint swelling and gait problem.  Skin: Negative for color change, rash and wound.  Neurological: Negative for dizziness, tremors, syncope, speech difficulty and light-headedness.  Hematological: Negative for adenopathy.  Psychiatric/Behavioral: Negative for suicidal ideas, hallucinations, behavioral problems, confusion, sleep disturbance, dysphoric mood and decreased concentration. The patient is not hyperactive.        Objective:   Physical Exam  Constitutional: She appears well-developed and well-nourished. No distress.  HENT:  Head: Normocephalic.  Right Ear: External ear normal.  Left  Ear: External ear normal.  Nose: Nose normal.  Mouth/Throat: Oropharynx is clear and moist.  Eyes: Conjunctivae are normal. Pupils are equal, round, and reactive to light. Right eye exhibits no discharge. Left eye exhibits no discharge.  Neck: Normal range of motion. Neck supple. No JVD present. No tracheal deviation present. No thyromegaly present.  Cardiovascular: Normal rate, regular rhythm and normal heart sounds.   Pulmonary/Chest: No stridor. No respiratory distress. She has no wheezes.  Abdominal: Soft. Bowel sounds are normal. She exhibits no distension and no mass. There is no tenderness. There is no rebound and no guarding.  Musculoskeletal: She exhibits no edema and no tenderness.  Lymphadenopathy:    She has no cervical adenopathy.  Neurological: She displays normal reflexes. No cranial nerve deficit. She exhibits normal muscle tone. Coordination normal.  Skin: No rash noted. No erythema.  Psychiatric: She has a normal mood and affect. Her behavior is normal. Judgment and thought content normal.    Lab Results  Component Value Date   WBC 5.1 08/15/2012   HGB 14.0 08/15/2012   HCT 42.4 08/15/2012   PLT 269.0 08/15/2012   GLUCOSE 94 08/15/2012   CHOL 215* 08/15/2012   TRIG 50.0 08/15/2012   HDL 95.90 08/15/2012   LDLDIRECT 107.6 08/15/2012   ALT 18 08/15/2012   AST 35 08/15/2012   NA 138 08/15/2012   K 4.5 08/15/2012   CL 103 08/15/2012   CREATININE 0.6 08/15/2012   BUN 17 08/15/2012   CO2 29 08/15/2012   TSH 3.27 08/15/2012          Assessment & Plan:

## 2013-08-23 ENCOUNTER — Other Ambulatory Visit (INDEPENDENT_AMBULATORY_CARE_PROVIDER_SITE_OTHER): Payer: Medicare Other

## 2013-08-23 DIAGNOSIS — Z1211 Encounter for screening for malignant neoplasm of colon: Secondary | ICD-10-CM

## 2013-08-23 LAB — HEMOCCULT SLIDES (X 3 CARDS)
Fecal Occult Blood: NEGATIVE
OCCULT 1: NEGATIVE
OCCULT 2: NEGATIVE
OCCULT 3: NEGATIVE
OCCULT 4: NEGATIVE

## 2013-08-28 ENCOUNTER — Encounter: Payer: Self-pay | Admitting: Family Medicine

## 2013-08-28 ENCOUNTER — Ambulatory Visit (INDEPENDENT_AMBULATORY_CARE_PROVIDER_SITE_OTHER): Payer: Medicare Other | Admitting: Family Medicine

## 2013-08-28 VITALS — BP 120/80 | HR 74

## 2013-08-28 DIAGNOSIS — G5702 Lesion of sciatic nerve, left lower limb: Secondary | ICD-10-CM

## 2013-08-28 DIAGNOSIS — G5700 Lesion of sciatic nerve, unspecified lower limb: Secondary | ICD-10-CM

## 2013-08-28 NOTE — Patient Instructions (Signed)
Good to see you Go to physical therapy 1 time.  They will make sure you are doing exercises correctly and some new ones.  Decrease home exercises to most days of the week (3-4 times) Soup cans, 2# weight bent at 45 degress pull back with elbows in until you pinch shoulder blades together, hold 2 seconds, down for count of 4, 3 sets of 10 Then raises straight forward with shoulders.  Posture on wall for total of 5 minutes most days as well.  Come back again in 6 weeks.

## 2013-08-28 NOTE — Assessment & Plan Note (Signed)
Patient is improving. Patient was given a heel lift for her right shoe and states that this was very comfortable. Patient can transition that to other shoes or get over-the-counter. Patient will do home exercises most is the week, patient was also complaining of some mild neck discomfort and was given home exercises that can be helpful. Patient will try these interventions and come back in 4-6 weeks. At that time is continuing to have pain we will do an ultrasound.

## 2013-08-28 NOTE — Progress Notes (Signed)
CC: Followup piriformis syndrome left.   HPI: Patient is a very pleasant 67 year old female following up for appear for piriformis syndrome. Patient was given a home exercise program, anti-inflammatories, icing protocol. Patient states since last visit she is approximately 75% better. Patient still has some discomfort mostly with ambulation. Patient states though it is very localized to the buttocks region. Patient is having no radiation down the leg. Patient is able to do most of her exercises without any difficulty. Patient is doing his most distally. Patient to do to 5 days of meloxicam without any side effects. She is not use any more since that time. Patient denies any nighttime awakening, any weakness, and states overall she continues to improve.   Past medical, surgical, family and social history reviewed. Medications reviewed all in the electronic medical record.   Review of Systems: No headache, visual changes, nausea, vomiting, diarrhea, constipation, dizziness, abdominal pain, skin rash, fevers, chills, night sweats, weight loss, swollen lymph nodes, body aches, joint swelling, muscle aches, chest pain, shortness of breath, mood changes.   Objective:    Blood pressure 120/80, pulse 74, SpO2 98.00%.   General: No apparent distress alert and oriented x3 mood and affect normal, dressed appropriately.  HEENT: Pupils equal, extraocular movements intact Respiratory: Patient's speak in full sentences and does not appear short of breath Cardiovascular: No lower extremity edema, non tender, no erythema Skin: Warm dry intact with no signs of infection or rash on extremities or on axial skeleton. Abdomen: Soft nontender Neuro: Cranial nerves II through XII are intact, neurovascularly intact in all extremities with 2+ DTRs and 2+ pulses. Lymph: No lymphadenopathy of posterior or anterior cervical chain or axillae bilaterally.  Gait normal with good balance and coordination.  MSK: Non tender with  full range of motion and good stability and symmetric strength and tone of shoulders, elbows, wrist, , knee and ankles bilaterally.  Hip: Left ROM IR: 45 Deg, ER: 45 Deg, Flexion: 120 Deg, Extension: 100 Deg, Abduction: 45 Deg, Adduction: 45 Deg Strength IR: 5/5, ER: 5/5, Flexion: 5/5, Extension: 5/5, Abduction: 5/5, Adduction: 5/5 Pelvic alignment shows elevation of the left side Standing hip rotation and gait without trendelenburg sign / unsteadiness. Greater trochanter without tenderness to palpation.  tenderness still over the piriformis Positive pain with FABER negative FADIR. No SI joint tenderness and normal minimal SI movement. No leg length discrepancy Contralateral hip unremarkable Patient though does have a leg length discrepancy with right leg shorter by quarter of an inch   Impression and Recommendations:     This case required medical decision making of moderate complexity.

## 2013-08-28 NOTE — Progress Notes (Signed)
Pre-visit discussion using our clinic review tool. No additional management support is needed unless otherwise documented below in the visit note.  

## 2013-12-09 ENCOUNTER — Encounter: Payer: Self-pay | Admitting: Family Medicine

## 2013-12-09 ENCOUNTER — Ambulatory Visit (INDEPENDENT_AMBULATORY_CARE_PROVIDER_SITE_OTHER): Payer: Commercial Managed Care - HMO | Admitting: Family Medicine

## 2013-12-09 VITALS — BP 118/80 | HR 79 | Temp 97.7°F | Wt 99.0 lb

## 2013-12-09 DIAGNOSIS — M545 Low back pain, unspecified: Secondary | ICD-10-CM | POA: Insufficient documentation

## 2013-12-09 DIAGNOSIS — M549 Dorsalgia, unspecified: Secondary | ICD-10-CM

## 2013-12-09 MED ORDER — METHYLPREDNISOLONE ACETATE 80 MG/ML IJ SUSP: 80.0000 mg | Freq: Once | INTRAMUSCULAR | Status: DC

## 2013-12-09 MED ORDER — HYDROCODONE-ACETAMINOPHEN 7.5-325 MG PO TABS: 1.0000 | ORAL_TABLET | Freq: Three times a day (TID) | ORAL | Status: DC | PRN

## 2013-12-09 MED ORDER — KETOROLAC TROMETHAMINE 60 MG/2ML IM SOLN: 60.0000 mg | Freq: Four times a day (QID) | INTRAMUSCULAR | Status: DC | PRN

## 2013-12-09 MED ORDER — KETOROLAC TROMETHAMINE 60 MG/2ML IM SOLN: 60.0000 mg | Freq: Once | INTRAMUSCULAR | Status: AC

## 2013-12-09 NOTE — Assessment & Plan Note (Addendum)
Patient's low back pain is concerning for an occult fracture versus a severe muscle strain or tear. Patient having worsening pain with extension as well we may want to consider an iliolumbar ligament injury. We discussed different treatment options including imaging for patient which she declined today. Instead patient was given a shot of Toradol as well as Depo-Medrol for pain relief. Patient will do meloxicam for 5 days and stopped secondary to her history of gastrointestinal bleed. We discussed home exercise program she is feeling better and 48 hours. If not she is going to call back and we'll get x-rays see patient likely for further followup. Medications per order

## 2013-12-09 NOTE — Patient Instructions (Signed)
Good to see you again Ice 20 minutes 2 times a day Meloxicam daily for 5 days Two injections today to help Continue the Vitamin D If in severe pain try  Hydrocodone but will make you sleepy.  In 2 days if feeling better start these exercises Sacroiliac Joint Mobilization and Rehab 1. Work on pretzel stretching, shoulder back and leg draped in front. 3-5 sets, 30 sec.. 2. hip abductor rotations. standing, hip flexion and rotation outward then inward. 3 sets, 15 reps. when can do comfortably, add ankle weights starting at 2 pounds.  3. cross over stretching - shoulder back to ground, same side leg crossover. 3-5 sets for 30 min..  4. rolling up and back knees to chest and rocking. 5. sacral tilt - 5 sets, hold for 5-10 seconds  If not better in 2 days call me and we will get xray of lumbar spine

## 2013-12-09 NOTE — Progress Notes (Signed)
  [name] [address], [place] [address] Phone: [phone] Subjective:     CC: Acute back pain [id number] [name] is a 68 y.o. female coming in with complaint of with right-sided back pain. Patient states that this started approximately 3 days ago after a long car ride as well as watching her 36-year-old granddaughter for multiple days. This had been a lot more activity than she was use to. Patient was also taking up the granddaughter multiple times and did have a mild soreness in the lower back. After patient's car ride patient had severe pain on the back right-sided seem to radiate around towards the abdomen but denies going down the leg. Patient denies any weakness. Patient though was laying straight this morning and was unable to get up without any help. Patient states that his pain is 9/10 it is more of a sharp pain that is worse with any type of movement. Patient denies any bowel or bladder changes, denies any fevers or chills or any abnormal weight loss. Patient tried heat but otherwise no other home remedies at this time. I've seen patient previously for piriformis syndrome on the contralateral side and she states that this is significantly different.     Past medical history, social, surgical and family history all reviewed in electronic medical record.   Review of Systems: No headache, visual changes, nausea, vomiting, diarrhea, constipation, dizziness, abdominal pain, skin rash, fevers, chills, night sweats, weight loss, swollen lymph nodes, body aches, joint swelling, muscle aches, chest pain, shortness of breath, mood changes.   Objective Blood pressure 118/80, pulse 79, temperature 97.7 F (36.5 C), temperature source Oral, weight 99 lb (44.906 kg), SpO2 97.00%.  General: No apparent distress alert and oriented x3 mood and affect normal, dressed appropriately.  HEENT: Pupils equal, extraocular movements intact  Respiratory:  Patient's speak in full sentences and does not appear short of breath  Cardiovascular: No lower extremity edema, non tender, no erythema  Skin: Warm dry intact with no signs of infection or rash on extremities or on axial skeleton.  Abdomen: Soft nontender  Neuro: Cranial nerves II through XII are intact, neurovascularly intact in all extremities with 2+ DTRs and 2+ pulses.  Lymph: No lymphadenopathy of posterior or anterior cervical chain or axillae bilaterally.  Gait normal with good balance and coordination.  MSK:  Non tender with full range of motion and good stability and symmetric strength and tone of shoulders, elbows, wrist, hip, knee and ankles bilaterally.  Back Exam:  Inspection: Unremarkable  Motion: Flexion 25 deg, Extension 25 deg, Side Bending to 25 deg bilaterally,  Rotation to 45 deg bilaterally  SLR laying: Negative  XSLR laying: Negative  Palpable tenderness: Tenderness in the paraspinal muscular region on the right side. FABER: Positive right Sensory change: Gross sensation intact to all lumbar and sacral dermatomes.  Reflexes: 2+ at both patellar tendons, 2+ at achilles tendons, Babinski's downgoing.  Strength at foot  Plantar-flexion: 5/5 Dorsi-flexion: 5/5 Eversion: 5/5 Inversion: 5/5  Leg strength  Quad: 5/5 Hamstring: 5/5 Hip flexor: 5/5 Hip abductors: 5/5  Gait unremarkable but guarded Patient though going from a sitting to lying position had severe pain immediately right over the right SI joint area.    Impression and Recommendations:     This case required medical decision making of moderate complexity.

## 2013-12-11 ENCOUNTER — Telehealth: Payer: Self-pay | Admitting: *Deleted

## 2013-12-11 DIAGNOSIS — M545 Low back pain, unspecified: Secondary | ICD-10-CM

## 2013-12-11 NOTE — Telephone Encounter (Signed)
Please call and ask her to come in for an xray when she wants.  Order is in.  Thank you.  Also have her see me again later this week, Thursday or Friday.

## 2013-12-11 NOTE — Telephone Encounter (Signed)
Pt called states she continues to have back pain, states it is not as severe.  Pt further states it hurts when she moves.  Please advise

## 2013-12-11 NOTE — Telephone Encounter (Signed)
Pt called back and made aware of msg below. Appointment scheduled.

## 2013-12-12 ENCOUNTER — Ambulatory Visit (INDEPENDENT_AMBULATORY_CARE_PROVIDER_SITE_OTHER)
Admission: RE | Admit: 2013-12-12 | Discharge: 2013-12-12 | Disposition: A | Discharge status: Home / Self Care | Payer: Commercial Managed Care - HMO | Source: Ambulatory Visit | Attending: Family Medicine | Admitting: Family Medicine

## 2013-12-12 DIAGNOSIS — M545 Low back pain, unspecified: Secondary | ICD-10-CM

## 2013-12-14 ENCOUNTER — Ambulatory Visit (INDEPENDENT_AMBULATORY_CARE_PROVIDER_SITE_OTHER): Payer: Commercial Managed Care - HMO | Admitting: Family Medicine

## 2013-12-14 ENCOUNTER — Encounter: Payer: Self-pay | Admitting: Family Medicine

## 2013-12-14 VITALS — BP 98/64 | HR 80

## 2013-12-14 DIAGNOSIS — M5136 Other intervertebral disc degeneration, lumbar region: Secondary | ICD-10-CM | POA: Insufficient documentation

## 2013-12-14 DIAGNOSIS — M5137 Other intervertebral disc degeneration, lumbosacral region: Secondary | ICD-10-CM

## 2013-12-14 NOTE — Assessment & Plan Note (Signed)
Patient does have degenerative disc disease of L4-L5 and L5-S1. Mild grade 1 anterolisthesis noted. The patient was given different recommendations of over-the-counter medications it could be beneficial. We discussed icing and continuing the exercises. We discussed the importance of course strength. Patient will come back again if she is having any worsening pain or this does not seem to fully resolve in the next 3 weeks.  Spent greater than 25 minutes with patient face-to-face and had greater than 50% of counseling including as described above in assessment and plan.

## 2013-12-14 NOTE — Patient Instructions (Signed)
Good to see you and I am happy you are better Continue to ice after activity. Exercises 3-4 times a day Take tylenol 650 mg three times a day is the best evidence based medicine we have for arthritis.  Glucosamine sulfate 750mg  twice a day is a supplement that has been shown to help moderate to severe arthritis. Vitamin D 2000 IU daily Fish oil 2 grams daily.  Tumeric 500mg  twice daily.  Capsaicin topically up to four times a day may also help with pain. It's important that you continue to stay active. Controlling your weight is important.  Consider physical therapy to strengthen muscles around the joint that hurts to take pressure off of the joint itself. Shoe inserts with good arch support may be helpful.  [employer] orth[employer] at [employer] could help.  Water aerobics and cycling with low resistance are the best two types of exercise for arthritis. Desktop monitor at eye level Tennis ball between shoulder with sitting.  Come back and see me when you need me.

## 2013-12-14 NOTE — Progress Notes (Signed)
  [facility] [address], [place] [address] Phone: [phone] Subjective:     CC: Acute back pain [id number] [name] is a 68 y.o. female coming in with complaint of with right-sided back pain. Patient was seen 5 days ago for this back pain. Patient initially was taking care of granddaughter and picking up certain things that may have exacerbated this 3 days prior to first initial visit. Patient was given a intramuscular injection of steroids as well as anti-inflammatories initially. Patient given a home exercise program. Patient states that she is getting better everyday. Patient is able to bend over and do her daily activities no. Patient states that she feels about 80% better. Patient has been doing the exercises without any significant exacerbations. Patient denies any nighttime awakening or any new symptoms.  X-rays were ordered and reviewed by me. X-rays taken December 12, 2013 shows patient does have degenerative disc disease with degenerative facet arthropathy at L4-L5 and L5-S1 with mild grade 1 anterior listhesis of L4 on L5     Past medical history, social, surgical and family history all reviewed in electronic medical record.   Review of Systems: No headache, visual changes, nausea, vomiting, diarrhea, constipation, dizziness, abdominal pain, skin rash, fevers, chills, night sweats, weight loss, swollen lymph nodes, body aches, joint swelling, muscle aches, chest pain, shortness of breath, mood changes.   Objective Blood pressure 98/64, pulse 80, SpO2 99.00%.  General: No apparent distress alert and oriented x3 mood and affect normal, dressed appropriately.  HEENT: Pupils equal, extraocular movements intact  Respiratory: Patient's speak in full sentences and does not appear short of breath  Cardiovascular: No lower extremity edema, non tender, no erythema  Skin: Warm dry intact with no signs of infection or rash on extremities or on  axial skeleton.  Abdomen: Soft nontender  Neuro: Cranial nerves II through XII are intact, neurovascularly intact in all extremities with 2+ DTRs and 2+ pulses.  Lymph: No lymphadenopathy of posterior or anterior cervical chain or axillae bilaterally.  MSK:  Non tender with full range of motion and good stability and symmetric strength and tone of shoulders, elbows, wrist, hip, knee and ankles bilaterally.  Back Exam:  Inspection: Unremarkable  Motion: Flexion 45 deg, Extension 35 deg, Side Bending to 35 deg bilaterally,  Rotation to 45 deg bilaterally  SLR laying: Negative  XSLR laying: Negative  Palpable tenderness: Tenderness in the paraspinal muscular region on the right side and somewhat on the left side. FABER: Positive right Sensory change: Gross sensation intact to all lumbar and sacral dermatomes.  Reflexes: 2+ at both patellar tendons, 2+ at achilles tendons, Babinski's downgoing.  Strength at foot  Plantar-flexion: 5/5 Dorsi-flexion: 5/5 Eversion: 5/5 Inversion: 5/5  Leg strength  Quad: 5/5 Hamstring: 5/5 Hip flexor: 5/5 Hip abductors: 5/5  Gait unremarkable but still mildly guarded     Impression and Recommendations:     This case required medical decision making of moderate complexity.

## 2014-03-13 ENCOUNTER — Other Ambulatory Visit: Payer: Self-pay | Admitting: Gynecology

## 2014-03-13 DIAGNOSIS — Z1231 Encounter for screening mammogram for malignant neoplasm of breast: Secondary | ICD-10-CM

## 2014-03-14 ENCOUNTER — Telehealth: Payer: Self-pay | Admitting: *Deleted

## 2014-03-14 NOTE — Telephone Encounter (Signed)
Pt has question about 3 d mammogram, all question answered.

## 2014-04-11 ENCOUNTER — Ambulatory Visit (HOSPITAL_COMMUNITY)
Admission: RE | Admit: 2014-04-11 | Discharge: 2014-04-11 | Disposition: A | Discharge status: Home / Self Care | Payer: Medicare HMO | Source: Ambulatory Visit | Attending: Gynecology | Admitting: Gynecology

## 2014-04-11 DIAGNOSIS — Z1231 Encounter for screening mammogram for malignant neoplasm of breast: Secondary | ICD-10-CM | POA: Insufficient documentation

## 2014-05-10 ENCOUNTER — Encounter: Payer: Self-pay | Admitting: Internal Medicine

## 2014-06-21 ENCOUNTER — Ambulatory Visit (INDEPENDENT_AMBULATORY_CARE_PROVIDER_SITE_OTHER): Payer: Commercial Managed Care - HMO | Admitting: *Deleted

## 2014-06-21 DIAGNOSIS — Z23 Encounter for immunization: Secondary | ICD-10-CM

## 2014-08-03 ENCOUNTER — Other Ambulatory Visit (HOSPITAL_COMMUNITY)
Admission: RE | Admit: 2014-08-03 | Discharge: 2014-08-03 | Disposition: A | Discharge status: Home / Self Care | Payer: Medicare HMO | Source: Ambulatory Visit | Attending: Gynecology | Admitting: Gynecology

## 2014-08-03 ENCOUNTER — Ambulatory Visit (INDEPENDENT_AMBULATORY_CARE_PROVIDER_SITE_OTHER): Payer: Commercial Managed Care - HMO | Admitting: Gynecology

## 2014-08-03 ENCOUNTER — Encounter: Payer: Self-pay | Admitting: Gynecology

## 2014-08-03 VITALS — BP 118/76 | Ht 66.0 in | Wt 98.0 lb

## 2014-08-03 DIAGNOSIS — Z01419 Encounter for gynecological examination (general) (routine) without abnormal findings: Secondary | ICD-10-CM | POA: Insufficient documentation

## 2014-08-03 DIAGNOSIS — M8580 Other specified disorders of bone density and structure, unspecified site: Secondary | ICD-10-CM

## 2014-08-03 DIAGNOSIS — Z124 Encounter for screening for malignant neoplasm of cervix: Secondary | ICD-10-CM

## 2014-08-03 DIAGNOSIS — N952 Postmenopausal atrophic vaginitis: Secondary | ICD-10-CM

## 2014-08-03 NOTE — Addendum Note (Signed)
Addended by: Nelva Nay on: 08/03/2014 10:34 AM   Modules accepted: Orders

## 2014-08-03 NOTE — Patient Instructions (Signed)
You may obtain a copy of any labs that were done today by logging onto MyChart as outlined in the instructions provided with your AVS (after visit summary). The office will not call with normal lab results but certainly if there are any significant abnormalities then we will contact you.   Health Maintenance, Female A healthy lifestyle and preventative care can promote health and wellness.  Maintain regular health, dental, and eye exams.  Eat a healthy diet. Foods like vegetables, fruits, whole grains, low-fat dairy products, and lean protein foods contain the nutrients you need without too many calories. Decrease your intake of foods high in solid fats, added sugars, and salt. Get information about a proper diet from your caregiver, if necessary.  Regular physical exercise is one of the most important things you can do for your health. Most adults should get at least 150 minutes of moderate-intensity exercise (any activity that increases your heart rate and causes you to sweat) each week. In addition, most adults need muscle-strengthening exercises on 2 or more days a week.   Maintain a healthy weight. The body mass index (BMI) is a screening tool to identify possible weight problems. It provides an estimate of body fat based on height and weight. Your caregiver can help determine your BMI, and can help you achieve or maintain a healthy weight. For adults 20 years and older:  A BMI below 18.5 is considered underweight.  A BMI of 18.5 to 24.9 is normal.  A BMI of 25 to 29.9 is considered overweight.  A BMI of 30 and above is considered obese.  Maintain normal blood lipids and cholesterol by exercising and minimizing your intake of saturated fat. Eat a balanced diet with plenty of fruits and vegetables. Blood tests for lipids and cholesterol should begin at age 61 and be repeated every 5 years. If your lipid or cholesterol levels are high, you are over 50, or you are a high risk for heart  disease, you may need your cholesterol levels checked more frequently.Ongoing high lipid and cholesterol levels should be treated with medicines if diet and exercise are not effective.  If you smoke, find out from your caregiver how to quit. If you do not use tobacco, do not start.  Lung cancer screening is recommended for adults aged 33 80 years who are at high risk for developing lung cancer because of a history of smoking. Yearly low-dose computed tomography (CT) is recommended for people who have at least a 30-pack-year history of smoking and are a current smoker or have quit within the past 15 years. A pack year of smoking is smoking an average of 1 pack of cigarettes a day for 1 year (for example: 1 pack a day for 30 years or 2 packs a day for 15 years). Yearly screening should continue until the smoker has stopped smoking for at least 15 years. Yearly screening should also be stopped for people who develop a health problem that would prevent them from having lung cancer treatment.  If you are pregnant, do not drink alcohol. If you are breastfeeding, be very cautious about drinking alcohol. If you are not pregnant and choose to drink alcohol, do not exceed 1 drink per day. One drink is considered to be 12 ounces (355 mL) of beer, 5 ounces (148 mL) of wine, or 1.5 ounces (44 mL) of liquor.  Avoid use of street drugs. Do not share needles with anyone. Ask for help if you need support or instructions about stopping  the use of drugs.  High blood pressure causes heart disease and increases the risk of stroke. Blood pressure should be checked at least every 1 to 2 years. Ongoing high blood pressure should be treated with medicines, if weight loss and exercise are not effective.  If you are 59 to 68 years old, ask your caregiver if you should take aspirin to prevent strokes.  Diabetes screening involves taking a blood sample to check your fasting blood sugar level. This should be done once every 3  years, after age 91, if you are within normal weight and without risk factors for diabetes. Testing should be considered at a younger age or be carried out more frequently if you are overweight and have at least 1 risk factor for diabetes.  Breast cancer screening is essential preventative care for women. You should practice "breast self-awareness." This means understanding the normal appearance and feel of your breasts and may include breast self-examination. Any changes detected, no matter how small, should be reported to a caregiver. Women in their 66s and 30s should have a clinical breast exam (CBE) by a caregiver as part of a regular health exam every 1 to 3 years. After age 101, women should have a CBE every year. Starting at age 100, women should consider having a mammogram (breast X-ray) every year. Women who have a family history of breast cancer should talk to their caregiver about genetic screening. Women at a high risk of breast cancer should talk to their caregiver about having an MRI and a mammogram every year.  Breast cancer gene (BRCA)-related cancer risk assessment is recommended for women who have family members with BRCA-related cancers. BRCA-related cancers include breast, ovarian, tubal, and peritoneal cancers. Having family members with these cancers may be associated with an increased risk for harmful changes (mutations) in the breast cancer genes BRCA1 and BRCA2. Results of the assessment will determine the need for genetic counseling and BRCA1 and BRCA2 testing.  The Pap test is a screening test for cervical cancer. Women should have a Pap test starting at age 57. Between ages 25 and 35, Pap tests should be repeated every 2 years. Beginning at age 37, you should have a Pap test every 3 years as long as the past 3 Pap tests have been normal. If you had a hysterectomy for a problem that was not cancer or a condition that could lead to cancer, then you no longer need Pap tests. If you are  between ages 50 and 76, and you have had normal Pap tests going back 10 years, you no longer need Pap tests. If you have had past treatment for cervical cancer or a condition that could lead to cancer, you need Pap tests and screening for cancer for at least 20 years after your treatment. If Pap tests have been discontinued, risk factors (such as a new sexual partner) need to be reassessed to determine if screening should be resumed. Some women have medical problems that increase the chance of getting cervical cancer. In these cases, your caregiver may recommend more frequent screening and Pap tests.  The human papillomavirus (HPV) test is an additional test that may be used for cervical cancer screening. The HPV test looks for the virus that can cause the cell changes on the cervix. The cells collected during the Pap test can be tested for HPV. The HPV test could be used to screen women aged 44 years and older, and should be used in women of any age  who have unclear Pap test results. After the age of 55, women should have HPV testing at the same frequency as a Pap test.  Colorectal cancer can be detected and often prevented. Most routine colorectal cancer screening begins at the age of 44 and continues through age 20. However, your caregiver may recommend screening at an earlier age if you have risk factors for colon cancer. On a yearly basis, your caregiver may provide home test kits to check for hidden blood in the stool. Use of a small camera at the end of a tube, to directly examine the colon (sigmoidoscopy or colonoscopy), can detect the earliest forms of colorectal cancer. Talk to your caregiver about this at age 86, when routine screening begins. Direct examination of the colon should be repeated every 5 to 10 years through age 13, unless early forms of pre-cancerous polyps or small growths are found.  Hepatitis C blood testing is recommended for all people born from [birth year] through 1965 and any  individual with known risks for hepatitis C.  Practice safe sex. Use condoms and avoid high-risk sexual practices to reduce the spread of sexually transmitted infections (STIs). Sexually active women aged 36 and younger should be checked for Chlamydia, which is a common sexually transmitted infection. Older women with new or multiple partners should also be tested for Chlamydia. Testing for other STIs is recommended if you are sexually active and at increased risk.  Osteoporosis is a disease in which the bones lose minerals and strength with aging. This can result in serious bone fractures. The risk of osteoporosis can be identified using a bone density scan. Women ages 20 and over and women at risk for fractures or osteoporosis should discuss screening with their caregivers. Ask your caregiver whether you should be taking a calcium supplement or vitamin D to reduce the rate of osteoporosis.  Menopause can be associated with physical symptoms and risks. Hormone replacement therapy is available to decrease symptoms and risks. You should talk to your caregiver about whether hormone replacement therapy is right for you.  Use sunscreen. Apply sunscreen liberally and repeatedly throughout the day. You should seek shade when your shadow is shorter than you. Protect yourself by wearing long sleeves, pants, a wide-brimmed hat, and sunglasses year round, whenever you are outdoors.  Notify your caregiver of new moles or changes in moles, especially if there is a change in shape or color. Also notify your caregiver if a mole is larger than the size of a pencil eraser.  Stay current with your immunizations. Document Released: 03/23/2011 Document Revised: 01/02/2013 Document Reviewed: 03/23/2011 [facility] Patient Information 2014 [employer].

## 2014-08-03 NOTE — Progress Notes (Signed)
[name] 1946/09/06 [id number]        68 y.o.  G0P0 for follow up exam. Several issues noted below.  Past medical history,surgical history, problem list, medications, allergies, family history and social history were all reviewed and documented as reviewed in the EPIC chart.  ROS:  12 system ROS performed with pertinent positives and negatives included in the history, assessment and plan.   Additional significant findings :  none   Exam: Kim Counsellor Vitals:   08/03/14 0959  BP: 118/76  Height: 5\' 6"  (1.676 m)  Weight: 98 lb (44.453 kg)   General appearance:  Normal affect, orientation and appearance. Skin: Grossly normal HEENT: Without gross lesions.  No cervical or supraclavicular adenopathy. Thyroid normal.  Lungs:  Clear without wheezing, rales or rhonchi Cardiac: RR, without RMG Abdominal:  Soft, nontender, without masses, guarding, rebound, organomegaly or hernia Breasts:  Examined lying and sitting without masses, retractions, discharge or axillary adenopathy. Pelvic:  Ext/BUS/vagina with generalized atrophic changes  Cervix atrophic with cervical stenosis to the brush. Pap done  Uterus anteverted, normal size, shape and contour, midline and mobile nontender   Adnexa  Without masses or tenderness    Anus and perineum  Normal   Rectovaginal  Normal sphincter tone without palpated masses or tenderness.    Assessment/Plan:  68 y.o. G0P0 female for follow up exam.   1. Postmenopausal/atrophic genital changes. Patient without significant symptoms of hot flashes, night sweats, vaginal dryness or dyspareunia. No vaginal bleeding. Continue to monitor. Report any vaginal bleeding. 2. Osteopenia.  DEXA 2013 T score -1.7 FRAX 12%/4%. Had been counseled as far as treatment options in the past and actually tried Fosamax but had a reaction to it. Developed a rash with Evista. Ultimately declined treatment (see 08/02/2013 note). Due for DEXA now at 2 year interval and she'll  schedule this. Normal vitamin D levels at Dr. Judeen Hammans office. Is on extra vitamin D and calcium supplement. 3. Mammography 03/2014. Continue with annual mammography. SBE monthly reviewed. 4. Pap smear 2012. Pap smear done today. No history of significant abnormal Pap smears previously. Options to stop screening altogether she is over the age of 42 versus screening on a less frequent interval reviewed. Will readdress on an annual basis. 5. Colonoscopy 2009. Repeat at their recommended interval. 6. Health maintenance. No routine blood work done as this is done at Dr. [employer] office. Follow up for bone density otherwise 1 year, sooner if any issues.     Anastasio Auerbach MD, 10:21 AM 08/03/2014

## 2014-08-04 LAB — URINALYSIS W MICROSCOPIC + REFLEX CULTURE
BACTERIA UA: NONE SEEN
BILIRUBIN URINE: NEGATIVE
Casts: NONE SEEN
Crystals: NONE SEEN
Glucose, UA: NEGATIVE mg/dL
HGB URINE DIPSTICK: NEGATIVE
KETONES UR: NEGATIVE mg/dL
Leukocytes, UA: NEGATIVE
NITRITE: NEGATIVE
Protein, ur: NEGATIVE mg/dL
Squamous Epithelial / LPF: NONE SEEN
UROBILINOGEN UA: 0.2 mg/dL (ref 0.0–1.0)
pH: 6 (ref 5.0–8.0)

## 2014-08-07 LAB — CYTOLOGY - PAP

## 2014-08-20 ENCOUNTER — Telehealth: Payer: Self-pay | Admitting: Internal Medicine

## 2014-08-20 NOTE — Telephone Encounter (Signed)
Patient is requesting [employer] Referral to Dr. Lorene Dy at [facility].  Patient has appt 08/23/14 (Thurs) at 9:20am.

## 2014-08-21 DIAGNOSIS — M949 Disorder of cartilage, unspecified: Secondary | ICD-10-CM

## 2014-08-21 DIAGNOSIS — M899 Disorder of bone, unspecified: Secondary | ICD-10-CM

## 2014-08-21 HISTORY — DX: Disorder of cartilage, unspecified: M94.9

## 2014-08-21 HISTORY — DX: Disorder of bone, unspecified: M89.9

## 2014-08-21 NOTE — Telephone Encounter (Signed)
[name] #[id number] valid 08/03/2014 - 01/30/2015 for 6 visits

## 2014-08-23 ENCOUNTER — Ambulatory Visit (INDEPENDENT_AMBULATORY_CARE_PROVIDER_SITE_OTHER): Payer: Commercial Managed Care - HMO

## 2014-08-23 DIAGNOSIS — M8580 Other specified disorders of bone density and structure, unspecified site: Secondary | ICD-10-CM

## 2014-08-24 ENCOUNTER — Telehealth: Payer: Self-pay | Admitting: Gynecology

## 2014-08-24 ENCOUNTER — Encounter: Payer: Self-pay | Admitting: Gynecology

## 2014-08-24 NOTE — Telephone Encounter (Signed)
Left message for pt to call.

## 2014-08-24 NOTE — Telephone Encounter (Signed)
Tell patient bone density shows some loss of the spine. Not to the degree that we would want to start medication.  I would check a vitamin D level and increase weightbearing exercise like walking. Total calcium intake should be approximately 1200-1500 mg daily.  Recommend repeat bone density in 2 years

## 2014-08-24 NOTE — Telephone Encounter (Signed)
Pt informed the below note, pt will have vitamin d done at PCP office

## 2014-10-23 ENCOUNTER — Other Ambulatory Visit (INDEPENDENT_AMBULATORY_CARE_PROVIDER_SITE_OTHER): Payer: Commercial Managed Care - HMO

## 2014-10-23 ENCOUNTER — Ambulatory Visit (INDEPENDENT_AMBULATORY_CARE_PROVIDER_SITE_OTHER): Payer: Commercial Managed Care - HMO | Admitting: Internal Medicine

## 2014-10-23 ENCOUNTER — Encounter: Payer: Self-pay | Admitting: Internal Medicine

## 2014-10-23 VITALS — BP 116/72 | HR 56 | Temp 97.9°F | Wt 100.0 lb

## 2014-10-23 DIAGNOSIS — M2550 Pain in unspecified joint: Secondary | ICD-10-CM

## 2014-10-23 DIAGNOSIS — G9332 Myalgic encephalomyelitis/chronic fatigue syndrome: Secondary | ICD-10-CM

## 2014-10-23 DIAGNOSIS — R194 Change in bowel habit: Secondary | ICD-10-CM

## 2014-10-23 DIAGNOSIS — R202 Paresthesia of skin: Secondary | ICD-10-CM

## 2014-10-23 DIAGNOSIS — M8580 Other specified disorders of bone density and structure, unspecified site: Secondary | ICD-10-CM

## 2014-10-23 DIAGNOSIS — Z0000 Encounter for general adult medical examination without abnormal findings: Secondary | ICD-10-CM

## 2014-10-23 DIAGNOSIS — R5383 Other fatigue: Secondary | ICD-10-CM

## 2014-10-23 DIAGNOSIS — R5382 Chronic fatigue, unspecified: Secondary | ICD-10-CM

## 2014-10-23 LAB — TSH: TSH: 2.52 u[IU]/mL (ref 0.35–4.50)

## 2014-10-23 LAB — CBC WITH DIFFERENTIAL/PLATELET
BASOS ABS: 0 10*3/uL (ref 0.0–0.1)
Basophils Relative: 0.7 % (ref 0.0–3.0)
Eosinophils Absolute: 0.2 10*3/uL (ref 0.0–0.7)
Eosinophils Relative: 4.2 % (ref 0.0–5.0)
HCT: 43 % (ref 36.0–46.0)
Hemoglobin: 14.6 g/dL (ref 12.0–15.0)
LYMPHS PCT: 30.5 % (ref 12.0–46.0)
Lymphs Abs: 1.6 10*3/uL (ref 0.7–4.0)
MCHC: 33.9 g/dL (ref 30.0–36.0)
MCV: 90.6 fl (ref 78.0–100.0)
Monocytes Absolute: 0.6 10*3/uL (ref 0.1–1.0)
Monocytes Relative: 11.1 % (ref 3.0–12.0)
NEUTROS ABS: 2.8 10*3/uL (ref 1.4–7.7)
Neutrophils Relative %: 53.5 % (ref 43.0–77.0)
PLATELETS: 312 10*3/uL (ref 150.0–400.0)
RBC: 4.74 Mil/uL (ref 3.87–5.11)
RDW: 12.3 % (ref 11.5–15.5)
WBC: 5.3 10*3/uL (ref 4.0–10.5)

## 2014-10-23 LAB — BASIC METABOLIC PANEL
BUN: 14 mg/dL (ref 6–23)
CALCIUM: 9.6 mg/dL (ref 8.4–10.5)
CO2: 31 mEq/L (ref 19–32)
CREATININE: 0.69 mg/dL (ref 0.40–1.20)
Chloride: 105 mEq/L (ref 96–112)
GFR: 89.65 mL/min (ref >60.00)
Glucose, Bld: 90 mg/dL (ref 70–99)
POTASSIUM: 5 meq/L (ref 3.5–5.1)
Sodium: 140 mEq/L (ref 135–145)

## 2014-10-23 LAB — LIPID PANEL
CHOLESTEROL: 205 mg/dL — AB (ref 0–200)
HDL: 91.1 mg/dL (ref >39.00)
LDL CALC: 98 mg/dL (ref 0–99)
NONHDL: 113.9
Total CHOL/HDL Ratio: 2
Triglycerides: 82 mg/dL (ref 0.0–149.0)
VLDL: 16.4 mg/dL (ref 0.0–40.0)

## 2014-10-23 LAB — URINALYSIS
Bilirubin Urine: NEGATIVE
Ketones, ur: NEGATIVE
Leukocytes, UA: NEGATIVE
NITRITE: NEGATIVE
Specific Gravity, Urine: 1.025 (ref 1.000–1.030)
Total Protein, Urine: NEGATIVE
URINE GLUCOSE: NEGATIVE
Urobilinogen, UA: 0.2 (ref 0.0–1.0)
pH: 5.5 (ref 5.0–8.0)

## 2014-10-23 LAB — VITAMIN B12: Vitamin B-12: 552 pg/mL (ref 211–911)

## 2014-10-23 LAB — HEPATIC FUNCTION PANEL
ALK PHOS: 63 U/L (ref 39–117)
ALT: 15 U/L (ref 0–35)
AST: 31 U/L (ref 0–37)
Albumin: 4.5 g/dL (ref 3.5–5.2)
Bilirubin, Direct: 0.1 mg/dL (ref 0.0–0.3)
TOTAL PROTEIN: 6.6 g/dL (ref 6.0–8.3)
Total Bilirubin: 0.7 mg/dL (ref 0.2–1.2)

## 2014-10-23 LAB — SEDIMENTATION RATE: SED RATE: 4 mm/h (ref 0–22)

## 2014-10-23 LAB — VITAMIN D 25 HYDROXY (VIT D DEFICIENCY, FRACTURES): VITD: 50.75 ng/mL (ref 30.00–100.00)

## 2014-10-23 NOTE — Progress Notes (Signed)
   Subjective:    Patient ID: [name], female    DOB: [birth year], 69 y.o.   MRN: [id number]  HPI The patient is here for a wellness exam.   C/o fatigue, aches and pains x months - new C/o loose stools after meals x months ( she used to be constipated) Not sleeping well - insomnia  Wt Readings from Last 3 Encounters:  10/23/14 100 lb (45.36 kg)  08/03/14 98 lb (44.453 kg)  12/09/13 99 lb (44.906 kg)   BP Readings from Last 3 Encounters:  10/23/14 116/72  08/03/14 118/76  12/14/13 98/64      Review of Systems  Constitutional: Negative for fever, chills, diaphoresis, activity change, appetite change, fatigue and unexpected weight change.  HENT: Negative for hearing loss, ear pain, congestion, sore throat, sneezing, mouth sores, neck pain, dental problem, voice change, postnasal drip and sinus pressure.   Eyes: Negative for pain and visual disturbance.  Respiratory: Negative for cough, chest tightness, wheezing and stridor.   Cardiovascular: Negative for chest pain, palpitations and leg swelling.  Gastrointestinal: Negative for nausea, vomiting, abdominal pain, blood in stool, abdominal distention and rectal pain.  Genitourinary: Negative for dysuria, hematuria, decreased urine volume, vaginal bleeding, vaginal discharge, difficulty urinating, vaginal pain and menstrual problem.  Musculoskeletal: Positive for back pain, arthralgias joint swelling and gait problem.  Skin: Negative for color change, rash and wound.  Neurological: Negative for dizziness, tremors, syncope, speech difficulty and light-headedness.  Hematological: Negative for adenopathy.  Psychiatric/Behavioral: Negative for suicidal ideas, hallucinations, behavioral problems, confusion, sleep disturbance, dysphoric mood and decreased concentration. The patient is not hyperactive.        Objective:   Physical Exam  Constitutional: She appears well-developed and well-nourished. Thin. No distress.  HENT:  Head:  Normocephalic.  Right Ear: External ear normal.  Left Ear: External ear normal.  Nose: Nose normal.  Mouth/Throat: Oropharynx is clear and moist.  Eyes: Conjunctivae are normal. Pupils are equal, round, and reactive to light. Right eye exhibits no discharge. Left eye exhibits no discharge.  Neck: Normal range of motion. Neck supple. No JVD present. No tracheal deviation present. No thyromegaly present.  Cardiovascular: Normal rate, regular rhythm and normal heart sounds.   Pulmonary/Chest: No stridor. No respiratory distress. She has no wheezes.  Abdominal: Soft. Bowel sounds are normal. She exhibits no distension and no mass. There is no tenderness. There is no rebound and no guarding.  Musculoskeletal: She exhibits no edema and no tenderness.  Lymphadenopathy:    She has no cervical adenopathy.  Neurological: She displays normal reflexes. No cranial nerve deficit. She exhibits normal muscle tone. Coordination normal.  Skin: No rash noted. No erythema.  Psychiatric: She has a normal mood and affect. Her behavior is normal. Judgment and thought content normal.    Lab Results  Component Value Date   WBC 6.2 08/14/2013   HGB 14.1 08/14/2013   HCT 42.3 08/14/2013   PLT 232.0 08/14/2013   GLUCOSE 80 08/14/2013   CHOL 215* 08/15/2012   TRIG 50.0 08/15/2012   HDL 95.90 08/15/2012   LDLDIRECT 107.6 08/15/2012   ALT 18 08/14/2013   AST 33 08/14/2013   NA 140 08/14/2013   K 5.1 08/14/2013   CL 105 08/14/2013   CREATININE 0.6 08/14/2013   BUN 13 08/14/2013   CO2 28 08/14/2013   TSH 2.66 08/14/2013          Assessment & Plan:

## 2014-10-23 NOTE — Assessment & Plan Note (Addendum)
2/16 Labs

## 2014-10-23 NOTE — Assessment & Plan Note (Signed)
2015  

## 2014-10-23 NOTE — Progress Notes (Signed)
Pre visit review using our clinic review tool, if applicable. No additional management support is needed unless otherwise documented below in the visit note. 

## 2014-10-23 NOTE — Patient Instructions (Signed)
Preventive Care for Adults A healthy lifestyle and preventive care can promote health and wellness. Preventive health guidelines for women include the following key practices.  A routine yearly physical is a good way to check with your health care provider about your health and preventive screening. It is a chance to share any concerns and updates on your health and to receive a thorough exam.  Visit your dentist for a routine exam and preventive care every 6 months. Brush your teeth twice a day and floss once a day. Good oral hygiene prevents tooth decay and gum disease.  The frequency of eye exams is based on your age, health, family medical history, use of contact lenses, and other factors. Follow your health care provider's recommendations for frequency of eye exams.  Eat a healthy diet. Foods like vegetables, fruits, whole grains, low-fat dairy products, and lean protein foods contain the nutrients you need without too many calories. Decrease your intake of foods high in solid fats, added sugars, and salt. Eat the right amount of calories for you.Get information about a proper diet from your health care provider, if necessary.  Regular physical exercise is one of the most important things you can do for your health. Most adults should get at least 150 minutes of moderate-intensity exercise (any activity that increases your heart rate and causes you to sweat) each week. In addition, most adults need muscle-strengthening exercises on 2 or more days a week.  Maintain a healthy weight. The body mass index (BMI) is a screening tool to identify possible weight problems. It provides an estimate of body fat based on height and weight. Your health care provider can find your BMI and can help you achieve or maintain a healthy weight.For adults 20 years and older:  A BMI below 18.5 is considered underweight.  A BMI of 18.5 to 24.9 is normal.  A BMI of 25 to 29.9 is considered overweight.  A BMI of  30 and above is considered obese.  Maintain normal blood lipids and cholesterol levels by exercising and minimizing your intake of saturated fat. Eat a balanced diet with plenty of fruit and vegetables. Blood tests for lipids and cholesterol should begin at age 76 and be repeated every 5 years. If your lipid or cholesterol levels are high, you are over 50, or you are at high risk for heart disease, you may need your cholesterol levels checked more frequently.Ongoing high lipid and cholesterol levels should be treated with medicines if diet and exercise are not working.  If you smoke, find out from your health care provider how to quit. If you do not use tobacco, do not start.  Lung cancer screening is recommended for adults aged 22-80 years who are at high risk for developing lung cancer because of a history of smoking. A yearly low-dose CT scan of the lungs is recommended for people who have at least a 30-pack-year history of smoking and are a current smoker or have quit within the past 15 years. A pack year of smoking is smoking an average of 1 pack of cigarettes a day for 1 year (for example: 1 pack a day for 30 years or 2 packs a day for 15 years). Yearly screening should continue until the smoker has stopped smoking for at least 15 years. Yearly screening should be stopped for people who develop a health problem that would prevent them from having lung cancer treatment.  If you are pregnant, do not drink alcohol. If you are breastfeeding,  be very cautious about drinking alcohol. If you are not pregnant and choose to drink alcohol, do not have more than 1 drink per day. One drink is considered to be 12 ounces (355 mL) of beer, 5 ounces (148 mL) of wine, or 1.5 ounces (44 mL) of liquor.  Avoid use of street drugs. Do not share needles with anyone. Ask for help if you need support or instructions about stopping the use of drugs.  High blood pressure causes heart disease and increases the risk of  stroke. Your blood pressure should be checked at least every 1 to 2 years. Ongoing high blood pressure should be treated with medicines if weight loss and exercise do not work.  If you are 3-86 years old, ask your health care provider if you should take aspirin to prevent strokes.  Diabetes screening involves taking a blood sample to check your fasting blood sugar level. This should be done once every 3 years, after age 67, if you are within normal weight and without risk factors for diabetes. Testing should be considered at a younger age or be carried out more frequently if you are overweight and have at least 1 risk factor for diabetes.  Breast cancer screening is essential preventive care for women. You should practice "breast self-awareness." This means understanding the normal appearance and feel of your breasts and may include breast self-examination. Any changes detected, no matter how small, should be reported to a health care provider. Women in their 8s and 30s should have a clinical breast exam (CBE) by a health care provider as part of a regular health exam every 1 to 3 years. After age 70, women should have a CBE every year. Starting at age 25, women should consider having a mammogram (breast X-ray test) every year. Women who have a family history of breast cancer should talk to their health care provider about genetic screening. Women at a high risk of breast cancer should talk to their health care providers about having an MRI and a mammogram every year.  Breast cancer gene (BRCA)-related cancer risk assessment is recommended for women who have family members with BRCA-related cancers. BRCA-related cancers include breast, ovarian, tubal, and peritoneal cancers. Having family members with these cancers may be associated with an increased risk for harmful changes (mutations) in the breast cancer genes BRCA1 and BRCA2. Results of the assessment will determine the need for genetic counseling and  BRCA1 and BRCA2 testing.  Routine pelvic exams to screen for cancer are no longer recommended for nonpregnant women who are considered low risk for cancer of the pelvic organs (ovaries, uterus, and vagina) and who do not have symptoms. Ask your health care provider if a screening pelvic exam is right for you.  If you have had past treatment for cervical cancer or a condition that could lead to cancer, you need Pap tests and screening for cancer for at least 20 years after your treatment. If Pap tests have been discontinued, your risk factors (such as having a new sexual partner) need to be reassessed to determine if screening should be resumed. Some women have medical problems that increase the chance of getting cervical cancer. In these cases, your health care provider may recommend more frequent screening and Pap tests.  The HPV test is an additional test that may be used for cervical cancer screening. The HPV test looks for the virus that can cause the cell changes on the cervix. The cells collected during the Pap test can be  tested for HPV. The HPV test could be used to screen women aged 30 years and older, and should be used in women of any age who have unclear Pap test results. After the age of 30, women should have HPV testing at the same frequency as a Pap test.  Colorectal cancer can be detected and often prevented. Most routine colorectal cancer screening begins at the age of 50 years and continues through age 75 years. However, your health care provider may recommend screening at an earlier age if you have risk factors for colon cancer. On a yearly basis, your health care provider may provide home test kits to check for hidden blood in the stool. Use of a small camera at the end of a tube, to directly examine the colon (sigmoidoscopy or colonoscopy), can detect the earliest forms of colorectal cancer. Talk to your health care provider about this at age 50, when routine screening begins. Direct  exam of the colon should be repeated every 5-10 years through age 75 years, unless early forms of pre-cancerous polyps or small growths are found.  People who are at an increased risk for hepatitis B should be screened for this virus. You are considered at high risk for hepatitis B if:  You were born in a country where hepatitis B occurs often. Talk with your health care provider about which countries are considered high risk.  Your parents were born in a high-risk country and you have not received a shot to protect against hepatitis B (hepatitis B vaccine).  You have HIV or AIDS.  You use needles to inject street drugs.  You live with, or have sex with, someone who has hepatitis B.  You get hemodialysis treatment.  You take certain medicines for conditions like cancer, organ transplantation, and autoimmune conditions.  Hepatitis C blood testing is recommended for all people born from 1945 through 1965 and any individual with known risks for hepatitis C.  Practice safe sex. Use condoms and avoid high-risk sexual practices to reduce the spread of sexually transmitted infections (STIs). STIs include gonorrhea, chlamydia, syphilis, trichomonas, herpes, HPV, and human immunodeficiency virus (HIV). Herpes, HIV, and HPV are viral illnesses that have no cure. They can result in disability, cancer, and death.  You should be screened for sexually transmitted illnesses (STIs) including gonorrhea and chlamydia if:  You are sexually active and are younger than 24 years.  You are older than 24 years and your health care provider tells you that you are at risk for this type of infection.  Your sexual activity has changed since you were last screened and you are at an increased risk for chlamydia or gonorrhea. Ask your health care provider if you are at risk.  If you are at risk of being infected with HIV, it is recommended that you take a prescription medicine daily to prevent HIV infection. This is  called preexposure prophylaxis (PrEP). You are considered at risk if:  You are a heterosexual woman, are sexually active, and are at increased risk for HIV infection.  You take drugs by injection.  You are sexually active with a partner who has HIV.  Talk with your health care provider about whether you are at high risk of being infected with HIV. If you choose to begin PrEP, you should first be tested for HIV. You should then be tested every 3 months for as long as you are taking PrEP.  Osteoporosis is a disease in which the bones lose minerals and strength   with aging. This can result in serious bone fractures or breaks. The risk of osteoporosis can be identified using a bone density scan. Women ages 65 years and over and women at risk for fractures or osteoporosis should discuss screening with their health care providers. Ask your health care provider whether you should take a calcium supplement or vitamin D to reduce the rate of osteoporosis.  Menopause can be associated with physical symptoms and risks. Hormone replacement therapy is available to decrease symptoms and risks. You should talk to your health care provider about whether hormone replacement therapy is right for you.  Use sunscreen. Apply sunscreen liberally and repeatedly throughout the day. You should seek shade when your shadow is shorter than you. Protect yourself by wearing long sleeves, pants, a wide-brimmed hat, and sunglasses year round, whenever you are outdoors.  Once a month, do a whole body skin exam, using a mirror to look at the skin on your back. Tell your health care provider of new moles, moles that have irregular borders, moles that are larger than a pencil eraser, or moles that have changed in shape or color.  Stay current with required vaccines (immunizations).  Influenza vaccine. All adults should be immunized every year.  Tetanus, diphtheria, and acellular pertussis (Td, Tdap) vaccine. Pregnant women should  receive 1 dose of Tdap vaccine during each pregnancy. The dose should be obtained regardless of the length of time since the last dose. Immunization is preferred during the 27th-36th week of gestation. An adult who has not previously received Tdap or who does not know her vaccine status should receive 1 dose of Tdap. This initial dose should be followed by tetanus and diphtheria toxoids (Td) booster doses every 10 years. Adults with an unknown or incomplete history of completing a 3-dose immunization series with Td-containing vaccines should begin or complete a primary immunization series including a Tdap dose. Adults should receive a Td booster every 10 years.  Varicella vaccine. An adult without evidence of immunity to varicella should receive 2 doses or a second dose if she has previously received 1 dose. Pregnant females who do not have evidence of immunity should receive the first dose after pregnancy. This first dose should be obtained before leaving the health care facility. The second dose should be obtained 4-8 weeks after the first dose.  Human papillomavirus (HPV) vaccine. Females aged 13-26 years who have not received the vaccine previously should obtain the 3-dose series. The vaccine is not recommended for use in pregnant females. However, pregnancy testing is not needed before receiving a dose. If a female is found to be pregnant after receiving a dose, no treatment is needed. In that case, the remaining doses should be delayed until after the pregnancy. Immunization is recommended for any person with an immunocompromised condition through the age of 26 years if she did not get any or all doses earlier. During the 3-dose series, the second dose should be obtained 4-8 weeks after the first dose. The third dose should be obtained 24 weeks after the first dose and 16 weeks after the second dose.  Zoster vaccine. One dose is recommended for adults aged 60 years or older unless certain conditions are  present.  Measles, mumps, and rubella (MMR) vaccine. Adults born before 1957 generally are considered immune to measles and mumps. Adults born in 1957 or later should have 1 or more doses of MMR vaccine unless there is a contraindication to the vaccine or there is laboratory evidence of immunity to   each of the three diseases. A routine second dose of MMR vaccine should be obtained at least 28 days after the first dose for students attending postsecondary schools, health care workers, or international travelers. People who received inactivated measles vaccine or an unknown type of measles vaccine during 1963-1967 should receive 2 doses of MMR vaccine. People who received inactivated mumps vaccine or an unknown type of mumps vaccine before 1979 and are at high risk for mumps infection should consider immunization with 2 doses of MMR vaccine. For females of childbearing age, rubella immunity should be determined. If there is no evidence of immunity, females who are not pregnant should be vaccinated. If there is no evidence of immunity, females who are pregnant should delay immunization until after pregnancy. Unvaccinated health care workers born before 1957 who lack laboratory evidence of measles, mumps, or rubella immunity or laboratory confirmation of disease should consider measles and mumps immunization with 2 doses of MMR vaccine or rubella immunization with 1 dose of MMR vaccine.  Pneumococcal 13-valent conjugate (PCV13) vaccine. When indicated, a person who is uncertain of her immunization history and has no record of immunization should receive the PCV13 vaccine. An adult aged 19 years or older who has certain medical conditions and has not been previously immunized should receive 1 dose of PCV13 vaccine. This PCV13 should be followed with a dose of pneumococcal polysaccharide (PPSV23) vaccine. The PPSV23 vaccine dose should be obtained at least 8 weeks after the dose of PCV13 vaccine. An adult aged 19  years or older who has certain medical conditions and previously received 1 or more doses of PPSV23 vaccine should receive 1 dose of PCV13. The PCV13 vaccine dose should be obtained 1 or more years after the last PPSV23 vaccine dose.  Pneumococcal polysaccharide (PPSV23) vaccine. When PCV13 is also indicated, PCV13 should be obtained first. All adults aged 65 years and older should be immunized. An adult younger than age 65 years who has certain medical conditions should be immunized. Any person who resides in a nursing home or long-term care facility should be immunized. An adult smoker should be immunized. People with an immunocompromised condition and certain other conditions should receive both PCV13 and PPSV23 vaccines. People with human immunodeficiency virus (HIV) infection should be immunized as soon as possible after diagnosis. Immunization during chemotherapy or radiation therapy should be avoided. Routine use of PPSV23 vaccine is not recommended for American Indians, [place] Natives, or people younger than 65 years unless there are medical conditions that require PPSV23 vaccine. When indicated, people who have unknown immunization and have no record of immunization should receive PPSV23 vaccine. One-time revaccination 5 years after the first dose of PPSV23 is recommended for people aged 19-64 years who have chronic kidney failure, nephrotic syndrome, asplenia, or immunocompromised conditions. People who received 1-2 doses of PPSV23 before age 65 years should receive another dose of PPSV23 vaccine at age 65 years or later if at least 5 years have passed since the previous dose. Doses of PPSV23 are not needed for people immunized with PPSV23 at or after age 65 years.  Meningococcal vaccine. Adults with asplenia or persistent complement component deficiencies should receive 2 doses of quadrivalent meningococcal conjugate (MenACWY-D) vaccine. The doses should be obtained at least 2 months apart.  Microbiologists working with certain meningococcal bacteria, military recruits, people at risk during an outbreak, and people who travel to or live in countries with a high rate of meningitis should be immunized. A first-year college student up through age   21 years who is living in a residence hall should receive a dose if she did not receive a dose on or after her 16th birthday. Adults who have certain high-risk conditions should receive one or more doses of vaccine.  Hepatitis A vaccine. Adults who wish to be protected from this disease, have certain high-risk conditions, work with hepatitis A-infected animals, work in hepatitis A research labs, or travel to or work in countries with a high rate of hepatitis A should be immunized. Adults who were previously unvaccinated and who anticipate close contact with an international adoptee during the first 60 days after arrival in the [place] from a country with a high rate of hepatitis A should be immunized.  Hepatitis B vaccine. Adults who wish to be protected from this disease, have certain high-risk conditions, may be exposed to blood or other infectious body fluids, are household contacts or sex partners of hepatitis B positive people, are clients or workers in certain care facilities, or travel to or work in countries with a high rate of hepatitis B should be immunized.  Haemophilus influenzae type b (Hib) vaccine. A previously unvaccinated person with asplenia or sickle cell disease or having a scheduled splenectomy should receive 1 dose of Hib vaccine. Regardless of previous immunization, a recipient of a hematopoietic stem cell transplant should receive a 3-dose series 6-12 months after her successful transplant. Hib vaccine is not recommended for adults with HIV infection. Preventive Services / Frequency Ages 64 to 68 years  Blood pressure check.** / Every 1 to 2 years.  Lipid and cholesterol check.** / Every 5 years beginning at age  22.  Clinical breast exam.** / Every 3 years for women in their 88s and 53s.  BRCA-related cancer risk assessment.** / For women who have family members with a BRCA-related cancer (breast, ovarian, tubal, or peritoneal cancers).  Pap test.** / Every 2 years from ages 90 through 51. Every 3 years starting at age 21 through age 56 or 3 with a history of 3 consecutive normal Pap tests.  HPV screening.** / Every 3 years from ages 24 through ages 1 to 46 with a history of 3 consecutive normal Pap tests.  Hepatitis C blood test.** / For any individual with known risks for hepatitis C.  Skin self-exam. / Monthly.  Influenza vaccine. / Every year.  Tetanus, diphtheria, and acellular pertussis (Tdap, Td) vaccine.** / Consult your health care provider. Pregnant women should receive 1 dose of Tdap vaccine during each pregnancy. 1 dose of Td every 10 years.  Varicella vaccine.** / Consult your health care provider. Pregnant females who do not have evidence of immunity should receive the first dose after pregnancy.  HPV vaccine. / 3 doses over 6 months, if 72 and younger. The vaccine is not recommended for use in pregnant females. However, pregnancy testing is not needed before receiving a dose.  Measles, mumps, rubella (MMR) vaccine.** / You need at least 1 dose of MMR if you were born in 1957 or later. You may also need a 2nd dose. For females of childbearing age, rubella immunity should be determined. If there is no evidence of immunity, females who are not pregnant should be vaccinated. If there is no evidence of immunity, females who are pregnant should delay immunization until after pregnancy.  Pneumococcal 13-valent conjugate (PCV13) vaccine.** / Consult your health care provider.  Pneumococcal polysaccharide (PPSV23) vaccine.** / 1 to 2 doses if you smoke cigarettes or if you have certain conditions.  Meningococcal vaccine.** /  1 dose if you are age 19 to 21 years and a first-year college  student living in a residence hall, or have one of several medical conditions, you need to get vaccinated against meningococcal disease. You may also need additional booster doses.  Hepatitis A vaccine.** / Consult your health care provider.  Hepatitis B vaccine.** / Consult your health care provider.  Haemophilus influenzae type b (Hib) vaccine.** / Consult your health care provider. Ages 40 to 64 years  Blood pressure check.** / Every 1 to 2 years.  Lipid and cholesterol check.** / Every 5 years beginning at age 20 years.  Lung cancer screening. / Every year if you are aged 55-80 years and have a 30-pack-year history of smoking and currently smoke or have quit within the past 15 years. Yearly screening is stopped once you have quit smoking for at least 15 years or develop a health problem that would prevent you from having lung cancer treatment.  Clinical breast exam.** / Every year after age 40 years.  BRCA-related cancer risk assessment.** / For women who have family members with a BRCA-related cancer (breast, ovarian, tubal, or peritoneal cancers).  Mammogram.** / Every year beginning at age 40 years and continuing for as long as you are in good health. Consult with your health care provider.  Pap test.** / Every 3 years starting at age 30 years through age 65 or 70 years with a history of 3 consecutive normal Pap tests.  HPV screening.** / Every 3 years from ages 30 years through ages 65 to 70 years with a history of 3 consecutive normal Pap tests.  Fecal occult blood test (FOBT) of stool. / Every year beginning at age 50 years and continuing until age 75 years. You may not need to do this test if you get a colonoscopy every 10 years.  Flexible sigmoidoscopy or colonoscopy.** / Every 5 years for a flexible sigmoidoscopy or every 10 years for a colonoscopy beginning at age 50 years and continuing until age 75 years.  Hepatitis C blood test.** / For all people born from 1945 through  1965 and any individual with known risks for hepatitis C.  Skin self-exam. / Monthly.  Influenza vaccine. / Every year.  Tetanus, diphtheria, and acellular pertussis (Tdap/Td) vaccine.** / Consult your health care provider. Pregnant women should receive 1 dose of Tdap vaccine during each pregnancy. 1 dose of Td every 10 years.  Varicella vaccine.** / Consult your health care provider. Pregnant females who do not have evidence of immunity should receive the first dose after pregnancy.  Zoster vaccine.** / 1 dose for adults aged 60 years or older.  Measles, mumps, rubella (MMR) vaccine.** / You need at least 1 dose of MMR if you were born in 1957 or later. You may also need a 2nd dose. For females of childbearing age, rubella immunity should be determined. If there is no evidence of immunity, females who are not pregnant should be vaccinated. If there is no evidence of immunity, females who are pregnant should delay immunization until after pregnancy.  Pneumococcal 13-valent conjugate (PCV13) vaccine.** / Consult your health care provider.  Pneumococcal polysaccharide (PPSV23) vaccine.** / 1 to 2 doses if you smoke cigarettes or if you have certain conditions.  Meningococcal vaccine.** / Consult your health care provider.  Hepatitis A vaccine.** / Consult your health care provider.  Hepatitis B vaccine.** / Consult your health care provider.  Haemophilus influenzae type b (Hib) vaccine.** / Consult your health care provider. Ages 65   years and over  Blood pressure check.** / Every 1 to 2 years.  Lipid and cholesterol check.** / Every 5 years beginning at age 22 years.  Lung cancer screening. / Every year if you are aged 73-80 years and have a 30-pack-year history of smoking and currently smoke or have quit within the past 15 years. Yearly screening is stopped once you have quit smoking for at least 15 years or develop a health problem that would prevent you from having lung cancer  treatment.  Clinical breast exam.** / Every year after age 4 years.  BRCA-related cancer risk assessment.** / For women who have family members with a BRCA-related cancer (breast, ovarian, tubal, or peritoneal cancers).  Mammogram.** / Every year beginning at age 40 years and continuing for as long as you are in good health. Consult with your health care provider.  Pap test.** / Every 3 years starting at age 9 years through age 34 or 91 years with 3 consecutive normal Pap tests. Testing can be stopped between 65 and 70 years with 3 consecutive normal Pap tests and no abnormal Pap or HPV tests in the past 10 years.  HPV screening.** / Every 3 years from ages 57 years through ages 64 or 45 years with a history of 3 consecutive normal Pap tests. Testing can be stopped between 65 and 70 years with 3 consecutive normal Pap tests and no abnormal Pap or HPV tests in the past 10 years.  Fecal occult blood test (FOBT) of stool. / Every year beginning at age 15 years and continuing until age 17 years. You may not need to do this test if you get a colonoscopy every 10 years.  Flexible sigmoidoscopy or colonoscopy.** / Every 5 years for a flexible sigmoidoscopy or every 10 years for a colonoscopy beginning at age 86 years and continuing until age 71 years.  Hepatitis C blood test.** / For all people born from [birth year] through 1965 and any individual with known risks for hepatitis C.  Osteoporosis screening.** / A one-time screening for women ages 83 years and over and women at risk for fractures or osteoporosis.  Skin self-exam. / Monthly.  Influenza vaccine. / Every year.  Tetanus, diphtheria, and acellular pertussis (Tdap/Td) vaccine.** / 1 dose of Td every 10 years.  Varicella vaccine.** / Consult your health care provider.  Zoster vaccine.** / 1 dose for adults aged 61 years or older.  Pneumococcal 13-valent conjugate (PCV13) vaccine.** / Consult your health care provider.  Pneumococcal  polysaccharide (PPSV23) vaccine.** / 1 dose for all adults aged 28 years and older.  Meningococcal vaccine.** / Consult your health care provider.  Hepatitis A vaccine.** / Consult your health care provider.  Hepatitis B vaccine.** / Consult your health care provider.  Haemophilus influenzae type b (Hib) vaccine.** / Consult your health care provider. ** Family history and personal history of risk and conditions may change your health care provider's recommendations. Document Released: 11/03/2001 Document Revised: 01/22/2014 Document Reviewed: 02/02/2011 [facility] Patient Information 2015 [place], [place]. This information is not intended to replace advice given to you by your health care provider. Make sure you discuss any questions you have with your health care provider.

## 2014-11-08 ENCOUNTER — Telehealth: Payer: Self-pay | Admitting: Internal Medicine

## 2014-11-08 NOTE — Telephone Encounter (Signed)
Pt called request result for the lab that was done 2.2.16. Please call her back

## 2014-11-12 NOTE — Telephone Encounter (Signed)
Pt informed of below.    Entered by Cassandria Anger, MD at 10/23/2014 10:37 PM    Dear Mrs Nydia Bouton,  Your labs/tests are normal.  Sincerely,  Walker Kehr, MD

## 2015-01-09 ENCOUNTER — Ambulatory Visit (INDEPENDENT_AMBULATORY_CARE_PROVIDER_SITE_OTHER): Payer: Commercial Managed Care - HMO | Admitting: Internal Medicine

## 2015-01-09 ENCOUNTER — Other Ambulatory Visit (INDEPENDENT_AMBULATORY_CARE_PROVIDER_SITE_OTHER): Payer: Commercial Managed Care - HMO

## 2015-01-09 ENCOUNTER — Encounter: Payer: Self-pay | Admitting: Internal Medicine

## 2015-01-09 VITALS — BP 116/64 | HR 58 | Ht 66.0 in | Wt 101.0 lb

## 2015-01-09 DIAGNOSIS — R198 Other specified symptoms and signs involving the digestive system and abdomen: Secondary | ICD-10-CM

## 2015-01-09 DIAGNOSIS — R194 Change in bowel habit: Secondary | ICD-10-CM

## 2015-01-09 DIAGNOSIS — R1912 Hyperactive bowel sounds: Secondary | ICD-10-CM

## 2015-01-09 LAB — IGA: IGA: 288 mg/dL (ref 68–378)

## 2015-01-09 NOTE — Patient Instructions (Signed)
Your physician has requested that you go to the basement for the following lab work before leaving today:  TTG, IGA  You have been scheduled for a colonoscopy. Please follow written instructions given to you at your visit today.  Please pick up your prep supplies at the pharmacy within the next 1-3 days. If you use inhalers (even only as needed), please bring them with you on the day of your procedure. Your physician has requested that you go to www.startemmi.com and enter the access code given to you at your visit today. This web site gives a general overview about your procedure. However, you should still follow specific instructions given to you by our office regarding your preparation for the procedure.   

## 2015-01-09 NOTE — Progress Notes (Signed)
  Referred by Dr. Alain Marion Subjective:    Patient ID: [name], female    DOB: [birth year], 69 y.o.   MRN: [id number] Cc: change in bowel habits HPI The patient is a delightful woman I know from previous interactions for peptic ulcer disease and screening and surveillance colonoscopy. About 7 or 8 months ago she developed a change in her bowel habits to where she defecates after every meal mostly. It's worse with lunch. She cannot identify particular types of foods that trigger it. She gets boborygmi or "flipping" in her abdomen prior to this and thenwe'll have to defecate. It's not particularly urgent and there is no pain. Stools are formed. She does not have nocturnal symptoms and she has not had bleeding. No change in medications. Fish oil was used but only intermittently. She was advised to stop her supplements but has not yet at this point. She has not tried over-the-counter medications for this. Weight is stable over time. She notes that she used to get indigestion but ever since she was treated for her ulcer including the H. Pylori treatment she has not had problems.in the past 1-2 years her husband's and hip surgery, and also been diagnosed with bladder cancer, he said a few problems but overall has done well what sounds like. This may be a source of stress for her. She notes her stepson has gone gluten-free and he's had digestive complaints and other symptoms and all gotten better and she has been encouraged to do this but has not. Medications, allergies, past medical history, past surgical history, family history and social history are reviewed and updated in the EMR.  Review of Systems Notable for some back pain off and on and some decreased vision. All other review of systems are negative or as per history of present illness.    Objective:   Physical Exam @BP  116/64 mmHg  Pulse 58  Ht 5\' 6"  (1.676 m)  Wt 101 lb (45.813 kg)  BMI 16.31 kg/m2@  General:  Well-developed,  well-nourished and in no acute distress Eyes:  anicteric. ENT:   Mouth and posterior pharynx free of lesions.  Neck:   supple w/o thyromegaly or mass.  Lungs: Clear to auscultation bilaterally. Heart:  S1S2, no rubs, murmurs, gallops. Abdomen:  soft, non-tender, no hepatosplenomegaly, hernia, or mass and BS+.  Rectal: deferred Lymph:  no cervical or supraclavicular adenopathy. Extremities:   no edema, cyanosis or clubbing in the upper extremities Neuro:  A&O x 3.  Psych:  appropriate mood and  Affect.   Data Reviewed: 2009 colonoscopy, 11/10/2014 primary care note, labs in the computer from February 2016 as well       Assessment & Plan:  Change in bowel habits  Borborygmi  1. Schedule colonoscopy to evaluate change in bowel habits The risks and benefits as well as alternatives of endoscopic procedure(s) have been discussed and reviewed. All questions answered. The patient agrees to proceed. TTG Ab and IgA level  I appreciate the opportunity to care for this patient. CC: Walker Kehr, MD

## 2015-01-10 LAB — TISSUE TRANSGLUTAMINASE, IGA: Tissue Transglutaminase Ab, IgA: 1 U/mL (ref <4)

## 2015-01-16 NOTE — Progress Notes (Signed)
Quick Note:  Neg celiac - My Chart note to pt ______

## 2015-02-06 ENCOUNTER — Encounter: Payer: Self-pay | Admitting: Internal Medicine

## 2015-02-06 ENCOUNTER — Ambulatory Visit (AMBULATORY_SURGERY_CENTER): Payer: Commercial Managed Care - HMO | Admitting: Internal Medicine

## 2015-02-06 VITALS — BP 107/63 | HR 65 | Temp 96.5°F | Resp 14 | Ht 66.0 in | Wt 101.0 lb

## 2015-02-06 DIAGNOSIS — R194 Change in bowel habit: Secondary | ICD-10-CM

## 2015-02-06 MED ORDER — VSL#3 PO CAPS: 2.0000 | ORAL_CAPSULE | Freq: Every day | ORAL | Status: DC

## 2015-02-06 MED ORDER — SODIUM CHLORIDE 0.9 % IV SOLN: 500.0000 mL | INTRAVENOUS | Status: DC

## 2015-02-06 NOTE — Patient Instructions (Addendum)
The colonoscopy was ok except for mild diverticulosis.  You should not need another routine colonoscopy.  I want you to take a probiotic called VSL#3 - 2 capsules daily. I know it is available at the [facility] and also at [facility]. It is over the counter but I wrote a prescription.  I appreciate the opportunity to care for you. Gatha Mayer, MD, FACG  YOU HAD AN ENDOSCOPIC PROCEDURE TODAY AT [facility]:   Refer to the procedure report that was given to you for any specific questions about what was found during the examination.  If the procedure report does not answer your questions, please call your gastroenterologist to clarify.  If you requested that your care partner not be given the details of your procedure findings, then the procedure report has been included in a sealed envelope for you to review at your convenience later.  YOU SHOULD EXPECT: Some feelings of bloating in the abdomen. Passage of more gas than usual.  Walking can help get rid of the air that was put into your GI tract during the procedure and reduce the bloating. If you had a lower endoscopy (such as a colonoscopy or flexible sigmoidoscopy) you may notice spotting of blood in your stool or on the toilet paper. If you underwent a bowel prep for your procedure, you may not have a normal bowel movement for a few days.  Please Note:  You might notice some irritation and congestion in your nose or some drainage.  This is from the oxygen used during your procedure.  There is no need for concern and it should clear up in a day or so.  SYMPTOMS TO REPORT IMMEDIATELY:   Following lower endoscopy (colonoscopy or flexible sigmoidoscopy):  Excessive amounts of blood in the stool  Significant tenderness or worsening of abdominal pains  Swelling of the abdomen that is new, acute  Fever of 100F or higher  For urgent or emergent issues, a gastroenterologist can be reached at any hour by  calling [phone].  DIET: Your first meal following the procedure should be a small meal and then it is ok to progress to your normal diet. Heavy or fried foods are harder to digest and may make you feel nauseous or bloated.  Likewise, meals heavy in dairy and vegetables can increase bloating.  Drink plenty of fluids but you should avoid alcoholic beverages for 24 hours.  ACTIVITY:  You should plan to take it easy for the rest of today and you should NOT DRIVE or use heavy machinery until tomorrow (because of the sedation medicines used during the test).    FOLLOW UP: Our staff will call the number listed on your records the next business day following your procedure to check on you and address any questions or concerns that you may have regarding the information given to you following your procedure. If we do not reach you, we will leave a message.  However, if you are feeling well and you are not experiencing any problems, there is no need to return our call.  We will assume that you have returned to your regular daily activities without incident.  SIGNATURES/CONFIDENTIALITY: You and/or your care partner have signed paperwork which will be entered into your electronic medical record.  These signatures attest to the fact that that the information above on your After Visit Summary has been reviewed and is understood.  Full responsibility of the confidentiality of this discharge information lies with  you and/or your care-partner.  Please see Dr. Carlean Purl if probiotic isn't helpful  Please read over handout about diverticulosis  Continue your normal medications

## 2015-02-06 NOTE — Op Note (Signed)
[place]  [address] [place], [address]   COLONOSCOPY PROCEDURE REPORT  PATIENT: [name], [name]  MR#: [id number] BIRTHDATE: [birth year] , 47  yrs. old GENDER: female ENDOSCOPIST: Gatha Mayer, MD, [facility] PROCEDURE DATE:  02/06/2015 PROCEDURE:   Colonoscopy, diagnostic First Screening Colonoscopy - Avg.  risk and is 50 yrs.  old or older - No.  Prior Negative Screening - Now for repeat screening. N/A  History of Adenoma - Now for follow-up colonoscopy & has been > or = to 3 yrs.  N/A  Polyps removed today? No Recommend repeat exam, <10 yrs? No ASA CLASS:   Class II INDICATIONS:Patient is not applicable for Colorectal Neoplasm Risk Assessment for this procedure and change in bowel habits. MEDICATIONS: Propofol 200 mg IV and Monitored anesthesia care  DESCRIPTION OF PROCEDURE:   After the risks benefits and alternatives of the procedure were thoroughly explained, informed consent was obtained.  The digital rectal exam revealed no abnormalities of the rectum.   The LB [id number]  endoscope was introduced through the anus and advanced to the cecum, which was identified by both the appendix and ileocecal valve. No adverse events experienced.   The quality of the prep was good.  (MiraLax was used)  The instrument was then slowly withdrawn as the colon was fully examined. Estimated blood loss is zero unless otherwise noted in this procedure report.      COLON FINDINGS: There was mild diverticulosis noted in the sigmoid colon.   The examination was otherwise normal.  Retroflexed views revealed no abnormalities. The time to cecum = 2.4 Withdrawal time = 10.1   The scope was withdrawn and the procedure completed. COMPLICATIONS: There were no immediate complications.  ENDOSCOPIC IMPRESSION: 1.   Mild diverticulosis was noted in the sigmoid colon 2.   The examination was otherwise normal  RECOMMENDATIONS: 1.  VSL # 3 2 capsules daily see me as needed  if that is not helpful 2.  Routine repeat colonoscopy screening not necessary.  See me/GI as needed.  eSigned:  Gatha Mayer, MD, [facility] 02/06/2015 1:53 PM   cc: The Patient

## 2015-02-06 NOTE — Progress Notes (Signed)
  [facility] Post-op Note  Patient: [name]  Procedure(s) Performed: colonoscopy  Patient Location: [facility] - Recovery Area  Anesthesia Type: Deep Sedation/Propofol  Level of Consciousness: awake, oriented and patient cooperative  Airway and Oxygen Therapy: Patient Spontanous Breathing  Post-op Pain: none  Post-op Assessment:  Post-op Vital signs reviewed, Patient's Cardiovascular Status Stable, Respiratory Function Stable, Patent Airway, No signs of Nausea or vomiting and Pain level controlled  Post-op Vital Signs: Reviewed and stable  Complications: No apparent anesthesia complications  Zorana Brockwell E 1:44 PM

## 2015-02-07 ENCOUNTER — Telehealth: Payer: Self-pay | Admitting: *Deleted

## 2015-02-07 NOTE — Telephone Encounter (Signed)
  Follow up Call-  Call back number 02/06/2015  Post procedure Call Back phone  # [phone]  Permission to leave phone message Yes     Patient questions:  Do you have a fever, pain , or abdominal swelling? No. Pain Score  0 *  Have you tolerated food without any problems? Yes.    Have you been able to return to your normal activities? Yes.    Do you have any questions about your discharge instructions: Diet   No. Medications  No. Follow up visit  No.  Do you have questions or concerns about your Care? No.  Actions: * If pain score is 4 or above: No action needed, pain <4.

## 2015-02-28 ENCOUNTER — Telehealth: Payer: Self-pay | Admitting: Internal Medicine

## 2015-02-28 NOTE — Telephone Encounter (Signed)
Notified pt letter ready for pick-up..lmb 

## 2015-02-28 NOTE — Telephone Encounter (Signed)
Erline Levine, please, type a letter Thx

## 2015-02-28 NOTE — Telephone Encounter (Signed)
Patient called and she is trying to get long term care insurance and they have stated that she is underweight and that her body weight doesn't match her height. They are needing a letter stating that there has been no drastic weight loss, she has always been skinny and that she has been in good health. Please call her at [phone] and she will pick up the letter. You may leave a detailed message on vm.

## 2015-03-06 ENCOUNTER — Ambulatory Visit: Payer: Commercial Managed Care - HMO | Admitting: *Deleted

## 2015-03-06 ENCOUNTER — Encounter: Payer: Self-pay | Admitting: *Deleted

## 2015-03-06 VITALS — Ht 64.75 in | Wt 106.4 lb

## 2015-03-06 DIAGNOSIS — R636 Underweight: Secondary | ICD-10-CM

## 2015-03-06 DIAGNOSIS — R627 Adult failure to thrive: Secondary | ICD-10-CM

## 2015-04-02 ENCOUNTER — Other Ambulatory Visit: Payer: Self-pay | Admitting: Gynecology

## 2015-04-02 DIAGNOSIS — Z1231 Encounter for screening mammogram for malignant neoplasm of breast: Secondary | ICD-10-CM

## 2015-04-18 ENCOUNTER — Ambulatory Visit (HOSPITAL_COMMUNITY)
Admission: RE | Admit: 2015-04-18 | Discharge: 2015-04-18 | Disposition: A | Discharge status: Home / Self Care | Payer: Commercial Managed Care - HMO | Source: Ambulatory Visit | Attending: Gynecology | Admitting: Gynecology

## 2015-04-18 DIAGNOSIS — Z1231 Encounter for screening mammogram for malignant neoplasm of breast: Secondary | ICD-10-CM | POA: Diagnosis present

## 2015-04-23 ENCOUNTER — Telehealth: Payer: Self-pay | Admitting: Internal Medicine

## 2015-04-23 NOTE — Telephone Encounter (Signed)
lmovm for pt to return call.  

## 2015-04-23 NOTE — Telephone Encounter (Signed)
Spoke to pt, scheduled her for 04/26/15 @ 9am.

## 2015-04-23 NOTE — Telephone Encounter (Signed)
[name] is a patient of Dr. Camila Li and Dr. Tamala Julian.  The ball of her left foot is in pain and traveling up leg.  Patient is requesting to be worked in with Dr. Tamala Julian.  If we get her in we need to make sure she does not need a [employer] referral.

## 2015-04-26 ENCOUNTER — Ambulatory Visit (INDEPENDENT_AMBULATORY_CARE_PROVIDER_SITE_OTHER): Payer: Commercial Managed Care - HMO | Admitting: Family Medicine

## 2015-04-26 ENCOUNTER — Other Ambulatory Visit (INDEPENDENT_AMBULATORY_CARE_PROVIDER_SITE_OTHER): Payer: Commercial Managed Care - HMO

## 2015-04-26 ENCOUNTER — Encounter: Payer: Self-pay | Admitting: Family Medicine

## 2015-04-26 VITALS — BP 116/72 | HR 79 | Ht 66.0 in | Wt 100.0 lb

## 2015-04-26 DIAGNOSIS — M79672 Pain in left foot: Secondary | ICD-10-CM

## 2015-04-26 DIAGNOSIS — M84375A Stress fracture, left foot, initial encounter for fracture: Secondary | ICD-10-CM | POA: Diagnosis not present

## 2015-04-26 NOTE — Progress Notes (Signed)
Pre visit review using our clinic review tool, if applicable. No additional management support is needed unless otherwise documented below in the visit note. 

## 2015-04-26 NOTE — Assessment & Plan Note (Signed)
Patient is put in a postop boot today. We discussed increasing her vitamin D supplementation. We discussed icing regimen. Patient will avoid being barefoot until I see her again after her trip.

## 2015-04-26 NOTE — Patient Instructions (Signed)
Good to see you  You broke 2 toes Wear the shoe at all times Will take 3-6 weeks Increase  Vitamin D 4000-5000 IU daily Have a great trip  And see me again after the trip

## 2015-04-26 NOTE — Progress Notes (Signed)
  [facility] [address] [place], [place] [address] Phone: [phone] Subjective:    I'm seeing this patient by the request  of:  Walker Kehr, MD   CC: left foot pain  XTG:GYIRSWNIOE [name] is a 69 y.o. female coming in with complaint of left foot pain. Patient states overall this started reaming couple weeks ago and then she did a yoga class which mated severely worse. States that it hurts more when she does any type of locking. Patient has noticed certain shoes seem to be more beneficial. States that it seems to be more over the first and second toes. Patient states when it initially happened after [employer] clash she had significant amount of swelling and some mild bruising. Patient states that this has resolved since then. Patient states that the pain is not improving. Rates the severity of pain a 6 out of 10. Patient does have a trip in approximately 2 weeks and is concerned.     Past medical history, social, surgical and family history all reviewed in electronic medical record.   Review of Systems: No headache, visual changes, nausea, vomiting, diarrhea, constipation, dizziness, abdominal pain, skin rash, fevers, chills, night sweats, weight loss, swollen lymph nodes, body aches, joint swelling, muscle aches, chest pain, shortness of breath, mood changes.   Objective Blood pressure 116/72, pulse 79, height 5\' 6"  (1.676 m), weight 100 lb (45.36 kg), SpO2 98 %.  General: No apparent distress alert and oriented x3 mood and affect normal, dressed appropriately.  HEENT: Pupils equal, extraocular movements intact  Respiratory: Patient's speak in full sentences and does not appear short of breath  Cardiovascular: No lower extremity edema, non tender, no erythema  Skin: Warm dry intact with no signs of infection or rash on extremities or on axial skeleton.  Abdomen: Soft nontender  Neuro: Cranial nerves II through XII are intact, neurovascularly intact  in all extremities with 2+ DTRs and 2+ pulses.  Lymph: No lymphadenopathy of posterior or anterior cervical chain or axillae bilaterally.  Gait normal with good balance and coordination.  MSK:  Non tender with full range of motion and good stability and symmetric strength and tone of shoulders, elbows, wrist, hip, knee and ankles bilaterally.  Foot exam shows the patient is tender to palpation over the first and second metatarsal bones. Seems to be proximal to the MP joint. No crepitus noted. Neurovascular intact distally.  Limited muscular skeletal ultrasound was performed and interpreted by Hulan Saas, M  Limited ultrasound shows patient does have a cortical defect over the first and second metatarsal bone significant increased Doppler flow as well as some mild hypoechoic changes surrounding the area.  Impression: First and second metatarsal fracture of the left foot    Impression and Recommendations:     This case required medical decision making of moderate complexity.

## 2015-05-29 ENCOUNTER — Encounter: Payer: Self-pay | Admitting: Family Medicine

## 2015-05-29 ENCOUNTER — Other Ambulatory Visit (INDEPENDENT_AMBULATORY_CARE_PROVIDER_SITE_OTHER): Payer: Commercial Managed Care - HMO

## 2015-05-29 ENCOUNTER — Ambulatory Visit (INDEPENDENT_AMBULATORY_CARE_PROVIDER_SITE_OTHER): Payer: Commercial Managed Care - HMO | Admitting: Family Medicine

## 2015-05-29 VITALS — BP 118/80 | HR 71 | Ht 66.0 in | Wt 100.0 lb

## 2015-05-29 DIAGNOSIS — M79672 Pain in left foot: Secondary | ICD-10-CM | POA: Diagnosis not present

## 2015-05-29 DIAGNOSIS — M84375D Stress fracture, left foot, subsequent encounter for fracture with routine healing: Secondary | ICD-10-CM | POA: Diagnosis not present

## 2015-05-29 NOTE — Patient Instructions (Signed)
Good to see you Ice at the end of the day Ok to go to the hiking boots.  Maybe wear boot in the house for 2 weeks.  3rd week go to regular shoes.  Continue high does of vitamin D for 2 weeks then back down to 2000IU daily See me again in 3 weeks if not perfect

## 2015-05-29 NOTE — Progress Notes (Signed)
Pre visit review using our clinic review tool, if applicable. No additional management support is needed unless otherwise documented below in the visit note. 

## 2015-05-29 NOTE — Assessment & Plan Note (Signed)
Patient has showed significant healing at this time. Encourage her to continue the high-dose vitamin D for another 2 weeks. We discussed transitioning into the shoes and increasing an exercise protocol. Patient will come back and see me again in 3 weeks for further evaluation and treatment.

## 2015-05-29 NOTE — Progress Notes (Signed)
  [name] [address] [place], [place] [address] Phone: [phone] Subjective:    I'm seeing this patient by the request  of:  Walker Kehr, MD   CC: left foot pain  IRC:VELFYBOFBP CHAVY AVERA is a 69 y.o. female coming in with complaint of left foot pain. Patient was seen a proximal wing 4 weeks ago and did have a metatarsal stress fracture was true cortical defect of the first and second metatarsals. Patient was put in the postop boot. Patient did go on a vacation and did do a lot of walking. States though that it continues to improve. Denies any significant pain. Has been diligent in wearing the postop boot. No new symptoms.     Past medical history, social, surgical and family history all reviewed in electronic medical record.   Review of Systems: No headache, visual changes, nausea, vomiting, diarrhea, constipation, dizziness, abdominal pain, skin rash, fevers, chills, night sweats, weight loss, swollen lymph nodes, body aches, joint swelling, muscle aches, chest pain, shortness of breath, mood changes.   Objective Blood pressure 118/80, pulse 71, height 5\' 6"  (1.676 m), weight 100 lb (45.36 kg), SpO2 99 %.  General: No apparent distress alert and oriented x3 mood and affect normal, dressed appropriately.  HEENT: Pupils equal, extraocular movements intact  Respiratory: Patient's speak in full sentences and does not appear short of breath  Cardiovascular: No lower extremity edema, non tender, no erythema  Skin: Warm dry intact with no signs of infection or rash on extremities or on axial skeleton.  Abdomen: Soft nontender  Neuro: Cranial nerves II through XII are intact, neurovascularly intact in all extremities with 2+ DTRs and 2+ pulses.  Lymph: No lymphadenopathy of posterior or anterior cervical chain or axillae bilaterally.  Gait normal with good balance and coordination.  MSK:  Non tender with full range of motion and good stability and  symmetric strength and tone of shoulders, elbows, wrist, hip, knee and ankles bilaterally.  Foot exam shows the patient is nontender to palpation. Full range of motion and no bruising or swelling noted. Neurovascularly intact distally.  Limited muscular skeletal ultrasound was performed and interpreted by Hulan Saas, M  Ultrasound shows the patient has reabsorption at this time with decreasing hypoechoic changes. Interval healing noted of the first and second metatarsal fractures  Impression: Significant healing between the first and second metatarsal fractures.    Impression and Recommendations:     This case required medical decision making of moderate complexity.

## 2015-06-19 ENCOUNTER — Ambulatory Visit: Payer: Commercial Managed Care - HMO | Admitting: Family Medicine

## 2015-06-25 ENCOUNTER — Ambulatory Visit (INDEPENDENT_AMBULATORY_CARE_PROVIDER_SITE_OTHER): Payer: Commercial Managed Care - HMO

## 2015-06-25 DIAGNOSIS — Z23 Encounter for immunization: Secondary | ICD-10-CM | POA: Diagnosis not present

## 2015-09-26 ENCOUNTER — Encounter: Payer: Self-pay | Admitting: Gynecology

## 2015-09-26 ENCOUNTER — Ambulatory Visit (INDEPENDENT_AMBULATORY_CARE_PROVIDER_SITE_OTHER): Payer: PPO | Admitting: Gynecology

## 2015-09-26 VITALS — BP 116/74 | Ht 66.0 in | Wt 100.0 lb

## 2015-09-26 DIAGNOSIS — Z01419 Encounter for gynecological examination (general) (routine) without abnormal findings: Secondary | ICD-10-CM

## 2015-09-26 DIAGNOSIS — M8580 Other specified disorders of bone density and structure, unspecified site: Secondary | ICD-10-CM | POA: Diagnosis not present

## 2015-09-26 DIAGNOSIS — N952 Postmenopausal atrophic vaginitis: Secondary | ICD-10-CM

## 2015-09-26 NOTE — Patient Instructions (Signed)

## 2015-09-26 NOTE — Progress Notes (Signed)
[name] 04/04/1946 [id number]        70 y.o.  G0P0 for breast and pelvic exam.  Past medical history,surgical history, problem list, medications, allergies, family history and social history were all reviewed and documented as reviewed in the EPIC chart.  ROS:  Performed with pertinent positives and negatives included in the history, assessment and plan.   Additional significant findings :  none   Exam: Caryn Bee assistant Filed Vitals:   09/26/15 1024  BP: 116/74  Height: 5\' 6"  (1.676 m)  Weight: 100 lb (45.36 kg)   General appearance:  Normal affect, orientation and appearance. Skin: Grossly normal HEENT: Without gross lesions.  No cervical or supraclavicular adenopathy. Thyroid normal.  Lungs:  Clear without wheezing, rales or rhonchi Cardiac: RR, without RMG Abdominal:  Soft, nontender, without masses, guarding, rebound, organomegaly or hernia Breasts:  Examined lying and sitting without masses, retractions, discharge or axillary adenopathy. Pelvic:  Ext/BUS/vagina with atrophic changes  Cervix with atrophic changes  Uterus anteverted, normal size, shape and contour, midline and mobile nontender   Adnexa  Without masses or tenderness    Anus and perineum  Normal   Rectovaginal  Normal sphincter tone without palpated masses or tenderness.    Assessment/Plan:  70 y.o. G0P0 female for annual exam.   1. Postmenopausal/atrophic genital changes. Patient without significant hot flushes, night sweats, vaginal dryness or any vaginal bleeding. Continue to monitor report any issues or vaginal bleeding. 2. Osteopenia.  DEXA 08/2014 T score -2.0  FRAX 6.9%/0.9%. Will repeat DEXA next year a two-year interval. Asked the patient to have a vitamin D level drawn next time her primary physician does her blood work. Patient agrees to do so. 3. Pap smear 2015. No Pap smear done today. No history of abnormal Pap smears. Options to stop screening altogether are less frequent screening  intervals reviewed. Will readdress on an annual basis. 4. Mammography 03/2015. Continue with annual mammography when due. SBE monthly reviewed. 5. Colonoscopy 2016. Repeat at their recommended interval. 6. Health maintenance. No routine lab work done as the patient has cyst on at her primary physician's office. Follow up 1 year, sooner as needed.   Anastasio Auerbach MD, 10:52 AM 09/26/2015

## 2015-10-03 DIAGNOSIS — D2262 Melanocytic nevi of left upper limb, including shoulder: Secondary | ICD-10-CM | POA: Diagnosis not present

## 2015-10-03 DIAGNOSIS — Z85828 Personal history of other malignant neoplasm of skin: Secondary | ICD-10-CM | POA: Diagnosis not present

## 2015-10-03 DIAGNOSIS — L821 Other seborrheic keratosis: Secondary | ICD-10-CM | POA: Diagnosis not present

## 2015-10-03 DIAGNOSIS — D2261 Melanocytic nevi of right upper limb, including shoulder: Secondary | ICD-10-CM | POA: Diagnosis not present

## 2015-10-03 DIAGNOSIS — D1801 Hemangioma of skin and subcutaneous tissue: Secondary | ICD-10-CM | POA: Diagnosis not present

## 2015-10-28 ENCOUNTER — Encounter: Payer: Self-pay | Admitting: Internal Medicine

## 2015-10-28 ENCOUNTER — Ambulatory Visit (INDEPENDENT_AMBULATORY_CARE_PROVIDER_SITE_OTHER): Payer: PPO | Admitting: Internal Medicine

## 2015-10-28 VITALS — BP 110/80 | HR 72 | Wt 98.0 lb

## 2015-10-28 DIAGNOSIS — M8580 Other specified disorders of bone density and structure, unspecified site: Secondary | ICD-10-CM | POA: Diagnosis not present

## 2015-10-28 DIAGNOSIS — M5440 Lumbago with sciatica, unspecified side: Secondary | ICD-10-CM

## 2015-10-28 DIAGNOSIS — Z0000 Encounter for general adult medical examination without abnormal findings: Secondary | ICD-10-CM | POA: Diagnosis not present

## 2015-10-28 DIAGNOSIS — M84375D Stress fracture, left foot, subsequent encounter for fracture with routine healing: Secondary | ICD-10-CM

## 2015-10-28 NOTE — Assessment & Plan Note (Signed)
On Vit D 

## 2015-10-28 NOTE — Assessment & Plan Note (Signed)
Wear good shoes all the time Vit D, exercise

## 2015-10-28 NOTE — Progress Notes (Signed)
Subjective:  Patient ID: [name], female    DOB: 1946-01-11  Age: 70 y.o. MRN: [id number]  CC: No chief complaint on file.   HPI [name] presents for osteopenia, foot fx  Outpatient Prescriptions Prior to Visit  Medication Sig Dispense Refill  . cholecalciferol (VITAMIN D) 1000 UNITS tablet Take 3,000 Units by mouth daily.     . Multiple Vitamin (MULTIVITAMIN) capsule Take 1 capsule by mouth daily.    . Omega-3 Fatty Acids (FISH OIL) 1000 MG CAPS Take 1 capsule by mouth daily.      . vitamin C (ASCORBIC ACID) 500 MG tablet Take 500 mg by mouth daily.       Facility-Administered Medications Prior to Visit  Medication Dose Route Frequency Provider Last Rate Last Dose  . methylPREDNISolone acetate (DEPO-MEDROL) injection 80 mg  80 mg Intramuscular Once Lyndal Pulley, DO        ROS Review of Systems  Constitutional: Negative for chills, activity change, appetite change, fatigue and unexpected weight change.  HENT: Negative for congestion, mouth sores and sinus pressure.   Eyes: Negative for visual disturbance.  Respiratory: Negative for cough and chest tightness.   Gastrointestinal: Negative for nausea and abdominal pain.  Genitourinary: Negative for frequency, difficulty urinating and vaginal pain.  Musculoskeletal: Negative for back pain and gait problem.  Skin: Negative for pallor and rash.  Neurological: Negative for dizziness, tremors, weakness, numbness and headaches.  Psychiatric/Behavioral: Negative for suicidal ideas, confusion and sleep disturbance.    Objective:  BP 110/80 mmHg  Pulse 72  Wt 98 lb (44.453 kg)  SpO2 96%  BP Readings from Last 3 Encounters:  10/28/15 110/80  09/26/15 116/74  05/29/15 118/80    Wt Readings from Last 3 Encounters:  10/28/15 98 lb (44.453 kg)  09/26/15 100 lb (45.36 kg)  05/29/15 100 lb (45.36 kg)    Physical Exam  Constitutional: She appears well-developed. No distress.  HENT:  Head: Normocephalic.    Right Ear: External ear normal.  Left Ear: External ear normal.  Nose: Nose normal.  Mouth/Throat: Oropharynx is clear and moist.  Eyes: Conjunctivae are normal. Pupils are equal, round, and reactive to light. Right eye exhibits no discharge. Left eye exhibits no discharge.  Neck: Normal range of motion. Neck supple. No JVD present. No tracheal deviation present. No thyromegaly present.  Cardiovascular: Normal rate, regular rhythm and normal heart sounds.   Pulmonary/Chest: No stridor. No respiratory distress. She has no wheezes.  Abdominal: Soft. Bowel sounds are normal. She exhibits no distension and no mass. There is no tenderness. There is no rebound and no guarding.  Musculoskeletal: She exhibits no edema or tenderness.  Lymphadenopathy:    She has no cervical adenopathy.  Neurological: She displays normal reflexes. No cranial nerve deficit. She exhibits normal muscle tone. Coordination normal.  Skin: No rash noted. No erythema.  Psychiatric: She has a normal mood and affect. Her behavior is normal. Judgment and thought content normal.    Lab Results  Component Value Date   WBC 5.3 10/23/2014   HGB 14.6 10/23/2014   HCT 43.0 10/23/2014   PLT 312.0 10/23/2014   GLUCOSE 90 10/23/2014   CHOL 205* 10/23/2014   TRIG 82.0 10/23/2014   HDL 91.10 10/23/2014   LDLDIRECT 107.6 08/15/2012   LDLCALC 98 10/23/2014   ALT 15 10/23/2014   AST 31 10/23/2014   NA 140 10/23/2014   K 5.0 10/23/2014   CL 105 10/23/2014   CREATININE 0.69 10/23/2014  BUN 14 10/23/2014   CO2 31 10/23/2014   TSH 2.52 10/23/2014    Mm Screening Breast Tomo Bilateral  04/18/2015  CLINICAL DATA:  Screening. EXAM: DIGITAL SCREENING BILATERAL MAMMOGRAM WITH 3D TOMO WITH CAD COMPARISON:  Previous exam(s). ACR Breast Density Category d: The breast tissue is extremely dense, which lowers the sensitivity of mammography. FINDINGS: There are no findings suspicious for malignancy. Images were processed with CAD.  IMPRESSION: No mammographic evidence of malignancy. A result letter of this screening mammogram will be mailed directly to the patient. RECOMMENDATION: Screening mammogram in one year. (Code:SM-B-01Y) BI-RADS CATEGORY  1: Negative. Electronically Signed   By: Everlean Alstrom M.D.   On: 04/18/2015 14:01    Assessment & Plan:   Diagnoses and all orders for this visit:  Bilateral low back pain with sciatica, sciatica laterality unspecified, unspecified chronicity  Osteopenia   I am having [name] maintain her cholecalciferol, Fish Oil, vitamin C, multivitamin, and Biotin. We will continue to administer methylPREDNISolone acetate.  Meds ordered this encounter  Medications  . Biotin 800 MCG TABS    Sig: Take 1 tablet by mouth daily.     Follow-up: Return in about 4 weeks (around 11/25/2015) for a follow-up visit.  Walker Kehr, MD

## 2015-10-28 NOTE — Progress Notes (Signed)
Pre visit review using our clinic review tool, if applicable. No additional management support is needed unless otherwise documented below in the visit note. 

## 2015-10-28 NOTE — Patient Instructions (Signed)
Use a snack

## 2015-10-28 NOTE — Assessment & Plan Note (Signed)
Chronic Vit D, core exercises, yard work

## 2015-11-24 ENCOUNTER — Emergency Department (HOSPITAL_COMMUNITY): Payer: PPO

## 2015-11-24 ENCOUNTER — Emergency Department (HOSPITAL_COMMUNITY)
Admission: EM | Admit: 2015-11-24 | Discharge: 2015-11-24 | Disposition: A | Discharge status: Home / Self Care | Payer: PPO | Attending: Emergency Medicine | Admitting: Emergency Medicine

## 2015-11-24 ENCOUNTER — Encounter (HOSPITAL_COMMUNITY): Payer: Self-pay | Admitting: *Deleted

## 2015-11-24 DIAGNOSIS — R51 Headache: Secondary | ICD-10-CM | POA: Insufficient documentation

## 2015-11-24 DIAGNOSIS — Z79899 Other long term (current) drug therapy: Secondary | ICD-10-CM | POA: Diagnosis not present

## 2015-11-24 DIAGNOSIS — Z872 Personal history of diseases of the skin and subcutaneous tissue: Secondary | ICD-10-CM | POA: Insufficient documentation

## 2015-11-24 DIAGNOSIS — Z86018 Personal history of other benign neoplasm: Secondary | ICD-10-CM | POA: Insufficient documentation

## 2015-11-24 DIAGNOSIS — M8580 Other specified disorders of bone density and structure, unspecified site: Secondary | ICD-10-CM | POA: Insufficient documentation

## 2015-11-24 DIAGNOSIS — R55 Syncope and collapse: Secondary | ICD-10-CM | POA: Insufficient documentation

## 2015-11-24 DIAGNOSIS — Z8719 Personal history of other diseases of the digestive system: Secondary | ICD-10-CM | POA: Diagnosis not present

## 2015-11-24 DIAGNOSIS — Z86010 Personal history of colonic polyps: Secondary | ICD-10-CM | POA: Insufficient documentation

## 2015-11-24 DIAGNOSIS — R110 Nausea: Secondary | ICD-10-CM | POA: Diagnosis not present

## 2015-11-24 DIAGNOSIS — Z8742 Personal history of other diseases of the female genital tract: Secondary | ICD-10-CM | POA: Diagnosis not present

## 2015-11-24 DIAGNOSIS — R42 Dizziness and giddiness: Secondary | ICD-10-CM | POA: Diagnosis not present

## 2015-11-24 LAB — COMPREHENSIVE METABOLIC PANEL
ALT: 18 U/L (ref 14–54)
ANION GAP: 10 (ref 5–15)
AST: 42 U/L — ABNORMAL HIGH (ref 15–41)
Albumin: 4.2 g/dL (ref 3.5–5.0)
Alkaline Phosphatase: 63 U/L (ref 38–126)
BUN: 12 mg/dL (ref 6–20)
CO2: 26 mmol/L (ref 22–32)
Calcium: 8.9 mg/dL (ref 8.9–10.3)
Chloride: 103 mmol/L (ref 101–111)
Creatinine, Ser: 0.63 mg/dL (ref 0.44–1.00)
GFR calc Af Amer: >60 mL/min (ref >60)
GFR calc non Af Amer: >60 mL/min (ref >60)
GLUCOSE: 111 mg/dL — AB (ref 65–99)
POTASSIUM: 3.7 mmol/L (ref 3.5–5.1)
Sodium: 139 mmol/L (ref 135–145)
TOTAL PROTEIN: 6.5 g/dL (ref 6.5–8.1)
Total Bilirubin: 0.9 mg/dL (ref 0.3–1.2)

## 2015-11-24 LAB — URINALYSIS, ROUTINE W REFLEX MICROSCOPIC
Bilirubin Urine: NEGATIVE
Glucose, UA: NEGATIVE mg/dL
Hgb urine dipstick: NEGATIVE
Ketones, ur: 15 mg/dL — AB
LEUKOCYTES UA: NEGATIVE
NITRITE: NEGATIVE
PH: 8 (ref 5.0–8.0)
Protein, ur: NEGATIVE mg/dL
SPECIFIC GRAVITY, URINE: 1.01 (ref 1.005–1.030)

## 2015-11-24 LAB — CBC
HEMATOCRIT: 40.6 % (ref 36.0–46.0)
HEMOGLOBIN: 13.3 g/dL (ref 12.0–15.0)
MCH: 30.6 pg (ref 26.0–34.0)
MCHC: 32.8 g/dL (ref 30.0–36.0)
MCV: 93.5 fL (ref 78.0–100.0)
Platelets: 245 10*3/uL (ref 150–400)
RBC: 4.34 MIL/uL (ref 3.87–5.11)
RDW: 12 % (ref 11.5–15.5)
WBC: 6.9 10*3/uL (ref 4.0–10.5)

## 2015-11-24 LAB — LIPASE, BLOOD: Lipase: 39 U/L (ref 11–51)

## 2015-11-24 MED ORDER — ONDANSETRON HCL 4 MG/2ML IJ SOLN: 4.0000 mg | Freq: Once | INTRAMUSCULAR | Status: DC

## 2015-11-24 MED ORDER — ONDANSETRON HCL 4 MG/2ML IJ SOLN
4.0000 mg | Freq: Once | INTRAMUSCULAR | Status: AC
  Administered 2015-11-24: 4 mg via INTRAVENOUS
  Filled 2015-11-24: qty 2

## 2015-11-24 MED ORDER — SODIUM CHLORIDE 0.9 % IV BOLUS (SEPSIS)
500.0000 mL | Freq: Once | INTRAVENOUS | Status: AC
  Administered 2015-11-24: 500 mL via INTRAVENOUS

## 2015-11-24 MED ORDER — ONDANSETRON 4 MG PO TBDP: 4.0000 mg | ORAL_TABLET | Freq: Three times a day (TID) | ORAL | Status: DC | PRN

## 2015-11-24 NOTE — Discharge Instructions (Signed)
Syncope, commonly known as fainting, is a temporary loss of consciousness. It occurs when the blood flow to the brain is reduced. Vasovagal syncope (also called neurocardiogenic syncope) is a fainting spell in which the blood flow to the brain is reduced because of a sudden drop in heart rate and blood pressure. Vasovagal syncope occurs when the brain and the cardiovascular system (blood vessels) do not adequately communicate and respond to each other. This is the most common cause of fainting. It often occurs in response to fear or some other type of emotional or physical stress. The body has a reaction in which the heart starts beating too slowly or the blood vessels expand, reducing blood pressure. This type of fainting spell is generally considered harmless. However, injuries can occur if a person takes a sudden fall during a fainting spell.   CAUSES   Vasovagal syncope occurs when a person's blood pressure and heart rate decrease suddenly, usually in response to a trigger. Many things and situations can trigger an episode. Some of these include:    Pain.    Fear.    The sight of blood or medical procedures, such as blood being drawn from a vein.    Common activities, such as coughing, swallowing, stretching, or going to the bathroom.    Emotional stress.    Prolonged standing, especially in a warm environment.    Lack of sleep or rest.    Prolonged lack of food.    Prolonged lack of fluids.    Recent illness.   The use of certain drugs that affect blood pressure, such as cocaine, alcohol, marijuana, inhalants, and opiates.   SYMPTOMS   Before the fainting episode, you may:    Feel dizzy or light headed.    Become pale.   Sense that you are going to faint.    Feel like the room is spinning.    Have tunnel vision, only seeing directly in front of you.    Feel sick to your stomach (nauseous).    See spots or slowly lose vision.    Hear ringing in your ears.    Have a headache.     Feel warm and sweaty.    Feel a sensation of pins and needles.  During the fainting spell, you will generally be unconscious for no longer than a couple minutes before waking up and returning to normal. If you get up too quickly before your body can recover, you may faint again. Some twitching or jerky movements may occur during the fainting spell.   DIAGNOSIS   Your health care provider will ask about your symptoms, take a medical history, and perform a physical exam. Various tests may be done to rule out other causes of fainting. These may include blood tests and tests to check the heart, such as electrocardiography, echocardiography, and possibly an electrophysiology study. When other causes have been ruled out, a test may be done to check the body's response to changes in position (tilt table test).  TREATMENT   Most cases of vasovagal syncope do not require treatment. Your health care provider may recommend ways to avoid fainting triggers and may provide home strategies for preventing fainting. If you must be exposed to a possible trigger, you can drink additional fluids to help reduce your chances of having an episode of vasovagal syncope. If you have warning signs of an oncoming episode, you can respond by positioning yourself favorably (lying down).  If your fainting spells continue, you may be   given medicines to prevent fainting. Some medicines may help make you more resistant to repeated episodes of vasovagal syncope. Special exercises or compression stockings may be recommended. In rare cases, the surgical placement of a pacemaker is considered.  HOME CARE INSTRUCTIONS    Learn to identify the warning signs of vasovagal syncope.    Sit or lie down at the first warning sign of a fainting spell. If sitting, put your head down between your legs. If you lie down, swing your legs up in the air to increase blood flow to the brain.    Avoid hot tubs and saunas.   Avoid prolonged standing.   Drink  enough fluids to keep your urine clear or pale yellow. Avoid caffeine.   Increase salt in your diet as directed by your health care provider.    If you have to stand for a long time, perform movements such as:     Crossing your legs.     Flexing and stretching your leg muscles.     Squatting.     Moving your legs.     Bending over.    Only take over-the-counter or prescription medicines as directed by your health care provider. Do not suddenly stop any medicines without asking your health care provider first.  SEEK MEDICAL CARE IF:    Your fainting spells continue or happen more frequently in spite of treatment.    You lose consciousness for more than a couple minutes.   You have fainting spells during or after exercising or after being startled.    You have new symptoms that occur with the fainting spells, such as:     Shortness of breath.    Chest pain.     Irregular heartbeat.    You have episodes of twitching or jerky movements that last longer than a few seconds.   You have episodes of twitching or jerky movements without obvious fainting.  SEEK IMMEDIATE MEDICAL CARE IF:    You have injuries or bleeding after a fainting spell.    You have episodes of twitching or jerky movements that last longer than 5 minutes.    You have more than one spell of twitching or jerky movements before returning to consciousness after fainting.     This information is not intended to replace advice given to you by your health care provider. Make sure you discuss any questions you have with your health care provider.     Document Released: 08/24/2012 Document Revised: 01/22/2015 Document Reviewed: 08/24/2012  Elsevier Interactive Patient Education 2016 [employer]

## 2015-11-24 NOTE — ED Notes (Addendum)
Husband states wife became weak, nauseated, with left arm numbness, hot feeling, now has a headache. Generally healthy

## 2015-11-24 NOTE — ED Provider Notes (Signed)
CSN: [id number]     Arrival date & time 11/24/15  1336 History   First MD Initiated Contact with Patient 11/24/15 1411     Chief Complaint  Patient presents with  . Emesis      HPI  Patient presents for evaluation of an episode of lightheadedness.  Patient was at home. She had her husband had been sitting watching a movie for about an hour and a half. She states that she had 4 cups of coffee but nothing other to eat, and a banana first thing this morning.  She stood up. When she said she felt lightheaded. Denies vertigo. States she became nauseated and developed a mild headache.  He sat down her symptoms improved. She states both of her arms felt tingly for less than a minute or 2 and this is resolved. No difficulty with speech or vision or get a lateral venous the arms or legs. No past similar episodes.  No history of hypertension diabetes, stroke, heart disease lung disease.  Past Medical History  Diagnosis Date  . Adenomatous colon polyp   . OSTEOPENIA 08/2014    T score -2.0 FRAX 6.9%/0.9%  . DIVERTICULOSIS, MILD 10/26/2007    colonoscopy 10/26/07  . Benign neoplasm of colon 01/18/2008  . HEMORRHOIDS, INTERNAL 01/18/2008  . Atrophic vaginitis   . Skin disorder     NOT PARAPSORIASIS--ETIOLOGY UNCLEAR  . Ulcer 01/31/06    STOMACH ULCER  . H pylori ulcer    Past Surgical History  Procedure Laterality Date  . Gastric ulcer and gi bleed (hospitalization)  5/07  . S/p eradication of h. pylori      confirmed by urea breath teast  . Esophagogastroduodenoscopy  03/30/2006  . Tonsillectomy  1955  . Ganglion cyst excision  1972    Right  . Lymph node biopsy  2001    Benign from the groin-Inflammatory changes due to her skin condition.  . Skin cancer excision      removed from the forehead  . Appendectomy  1963  . Tubal ligation    . Colonoscopy w/ biopsies      multiple   Family History  Problem Relation Age of Onset  . Breast cancer Mother     Age 76's  . Heart disease  Mother   . Heart disease Father   . Breast cancer Maternal Aunt     Age 52's  . Heart disease Maternal Grandmother   . Colon cancer Maternal Grandfather   . Breast cancer Cousin     Mat. cousins-Age late 58's and others 69's and 88's  . Colon cancer Cousin   . Colon cancer Maternal Uncle    Social History  Substance Use Topics  . Smoking status: Never Smoker   . Smokeless tobacco: Never Used  . Alcohol Use: 4.2 oz/week    7 Glasses of wine, 0 Standard drinks or equivalent per week   OB History    Gravida Para Term Preterm AB TAB SAB Ectopic Multiple Living   0              Review of Systems  Constitutional: Negative for fever, chills, diaphoresis, appetite change and fatigue.  HENT: Negative for mouth sores, sore throat and trouble swallowing.   Eyes: Negative for visual disturbance.  Respiratory: Negative for cough, chest tightness, shortness of breath and wheezing.   Cardiovascular: Negative for chest pain.  Gastrointestinal: Positive for nausea. Negative for vomiting, abdominal pain, diarrhea and abdominal distention.  Endocrine: Negative for polydipsia, polyphagia  and polyuria.  Genitourinary: Negative for dysuria, frequency and hematuria.  Musculoskeletal: Negative for gait problem.  Skin: Negative for color change, pallor and rash.  Neurological: Positive for light-headedness and headaches. Negative for dizziness and syncope.  Hematological: Does not bruise/bleed easily.  Psychiatric/Behavioral: Negative for behavioral problems and confusion.      Allergies  Fosamax  Home Medications   Prior to Admission medications   Medication Sig Start Date End Date Taking? Authorizing Provider  Biotin 800 MCG TABS Take 1 tablet by mouth daily.   Yes Historical Provider, MD  cholecalciferol (VITAMIN D) 1000 UNITS tablet Take 3,000 Units by mouth daily.    Yes Historical Provider, MD  Multiple Vitamin (MULTIVITAMIN) capsule Take 1 capsule by mouth daily.   Yes Historical  Provider, MD  Omega-3 Fatty Acids (FISH OIL) 1000 MG CAPS Take 1 capsule by mouth daily.     Yes Historical Provider, MD  vitamin C (ASCORBIC ACID) 500 MG tablet Take 500 mg by mouth daily.     Yes Historical Provider, MD  ondansetron (ZOFRAN ODT) 4 MG disintegrating tablet Take 1 tablet (4 mg total) by mouth every 8 (eight) hours as needed for nausea. 11/24/15   Tanna Furry, MD   BP 119/71 mmHg  Pulse 73  Temp(Src)   Resp 12  SpO2 100% Physical Exam  Constitutional: She is oriented to person, place, and time. She appears well-developed and well-nourished. No distress.  HENT:  Head: Normocephalic.  Eyes: Conjunctivae are normal. Pupils are equal, round, and reactive to light. No scleral icterus.  Neck: Normal range of motion. Neck supple. No thyromegaly present.  Cardiovascular: Normal rate and regular rhythm.  Exam reveals no gallop and no friction rub.   No murmur heard. Pulmonary/Chest: Effort normal and breath sounds normal. No respiratory distress. She has no wheezes. She has no rales.  Abdominal: Soft. Bowel sounds are normal. She exhibits no distension. There is no tenderness. There is no rebound.  Musculoskeletal: Normal range of motion.  Neurological: She is alert and oriented to person, place, and time.  Skin: Skin is warm and dry. No rash noted.  Psychiatric: She has a normal mood and affect. Her behavior is normal.    ED Course  Procedures (including critical care time) Labs Review Labs Reviewed  COMPREHENSIVE METABOLIC PANEL - Abnormal; Notable for the following:    Glucose, Bld 111 (*)    AST 42 (*)    All other components within normal limits  LIPASE, BLOOD  CBC  URINALYSIS, ROUTINE W REFLEX MICROSCOPIC (NOT AT [facility])    Imaging Review Ct Head Wo Contrast  11/24/2015  CLINICAL DATA:  Weakness, nausea, LEFT arm numbness and hot feeling, now with headache EXAM: CT HEAD WITHOUT CONTRAST TECHNIQUE: Contiguous axial images were obtained from the base of the skull through  the vertex without intravenous contrast. COMPARISON:  None FINDINGS: Generalized atrophy. Normal ventricular morphology. No midline shift or mass effect. Normal appearance of brain parenchyma. No intracranial hemorrhage, mass lesion, or evidence acute infarction. No extra-axial fluid collections. Visualized paranasal sinuses and mastoid air cells clear. No acute osseous findings. IMPRESSION: Atrophy. No acute intracranial abnormalities. Electronically Signed   By: Lavonia Dana M.D.   On: 11/24/2015 15:23   I have personally reviewed and evaluated these images and lab results as part of my medical decision-making.   EKG Interpretation None      MDM   Final diagnoses:  Vasovagal episode   Described orthostatic episode. May have been vagal mediated. CT  of the head shows no acute abnormalities and no stroke or hemorrhage. Is not anemic. Normal renal function. EKG shows normal rate and rhythm. Await urinalysis. Given 500 IV fluid. Still mild orthostasis. Taking some by mouth. Given Zofran. Discussion I do not think this represents subarachnoid hemorrhage or acute stroke or cardiac event. Very likely vagal. If tolerating  by mouth, and asymptomatic, normal urine, would be reasonable for discharge home, push fluids, recheck any recurrence.    Tanna Furry, MD 11/24/15 [phone]

## 2015-12-02 ENCOUNTER — Ambulatory Visit (INDEPENDENT_AMBULATORY_CARE_PROVIDER_SITE_OTHER): Payer: PPO | Admitting: Internal Medicine

## 2015-12-02 ENCOUNTER — Other Ambulatory Visit (INDEPENDENT_AMBULATORY_CARE_PROVIDER_SITE_OTHER): Payer: PPO

## 2015-12-02 ENCOUNTER — Encounter: Payer: Self-pay | Admitting: Internal Medicine

## 2015-12-02 VITALS — BP 118/80 | HR 70 | Wt 98.0 lb

## 2015-12-02 DIAGNOSIS — R42 Dizziness and giddiness: Secondary | ICD-10-CM | POA: Insufficient documentation

## 2015-12-02 DIAGNOSIS — G44209 Tension-type headache, unspecified, not intractable: Secondary | ICD-10-CM

## 2015-12-02 LAB — HEPATIC FUNCTION PANEL
ALBUMIN: 5 g/dL (ref 3.5–5.2)
ALT: 17 U/L (ref 0–35)
AST: 36 U/L (ref 0–37)
Alkaline Phosphatase: 70 U/L (ref 39–117)
Bilirubin, Direct: 0.1 mg/dL (ref 0.0–0.3)
Total Bilirubin: 0.7 mg/dL (ref 0.2–1.2)
Total Protein: 7.6 g/dL (ref 6.0–8.3)

## 2015-12-02 LAB — CBC WITH DIFFERENTIAL/PLATELET
BASOS PCT: 0.6 % (ref 0.0–3.0)
Basophils Absolute: 0 10*3/uL (ref 0.0–0.1)
EOS PCT: 3.3 % (ref 0.0–5.0)
Eosinophils Absolute: 0.2 10*3/uL (ref 0.0–0.7)
HCT: 42.9 % (ref 36.0–46.0)
HEMOGLOBIN: 14.4 g/dL (ref 12.0–15.0)
LYMPHS ABS: 2 10*3/uL (ref 0.7–4.0)
Lymphocytes Relative: 31.8 % (ref 12.0–46.0)
MCHC: 33.7 g/dL (ref 30.0–36.0)
MCV: 91.3 fl (ref 78.0–100.0)
MONOS PCT: 10 % (ref 3.0–12.0)
Monocytes Absolute: 0.6 10*3/uL (ref 0.1–1.0)
NEUTROS PCT: 54.3 % (ref 43.0–77.0)
Neutro Abs: 3.5 10*3/uL (ref 1.4–7.7)
Platelets: 281 10*3/uL (ref 150.0–400.0)
RBC: 4.7 Mil/uL (ref 3.87–5.11)
RDW: 12.6 % (ref 11.5–15.5)
WBC: 6.4 10*3/uL (ref 4.0–10.5)

## 2015-12-02 LAB — BASIC METABOLIC PANEL
BUN: 14 mg/dL (ref 6–23)
CHLORIDE: 102 meq/L (ref 96–112)
CO2: 30 mEq/L (ref 19–32)
Calcium: 10.4 mg/dL (ref 8.4–10.5)
Creatinine, Ser: 0.7 mg/dL (ref 0.40–1.20)
GFR: 87.89 mL/min (ref >60.00)
GLUCOSE: 102 mg/dL — AB (ref 70–99)
POTASSIUM: 5.3 meq/L — AB (ref 3.5–5.1)
SODIUM: 142 meq/L (ref 135–145)

## 2015-12-02 LAB — URINALYSIS
BILIRUBIN URINE: NEGATIVE
Hgb urine dipstick: NEGATIVE
Ketones, ur: NEGATIVE
Leukocytes, UA: NEGATIVE
Nitrite: NEGATIVE
PH: 6.5 (ref 5.0–8.0)
Specific Gravity, Urine: <=1.005 — AB (ref 1.000–1.030)
TOTAL PROTEIN, URINE-UPE24: NEGATIVE
Urine Glucose: NEGATIVE
Urobilinogen, UA: 0.2 (ref 0.0–1.0)

## 2015-12-02 LAB — TSH: TSH: 3.71 u[IU]/mL (ref 0.35–4.50)

## 2015-12-02 LAB — SEDIMENTATION RATE: SED RATE: 5 mm/h (ref 0–22)

## 2015-12-02 NOTE — Assessment & Plan Note (Signed)
3/17 x 1 wk ? Etiology Labs

## 2015-12-02 NOTE — Patient Instructions (Signed)
Take Tylenol for a headache

## 2015-12-02 NOTE — Assessment & Plan Note (Signed)
3/17 ?etiol Head CT is ok

## 2015-12-02 NOTE — Progress Notes (Signed)
Subjective:  Patient ID: [name], female    DOB: September 25, 1945  Age: 70 y.o. MRN: [id number]  CC: No chief complaint on file.   HPI JISSELLE KRIEGEL presents for a concentric B HA x 1 wk and one lightheaded episode at noon on 11/24/15  Outpatient Prescriptions Prior to Visit  Medication Sig Dispense Refill  . Biotin 800 MCG TABS Take 1 tablet by mouth daily.    . cholecalciferol (VITAMIN D) 1000 UNITS tablet Take 3,000 Units by mouth daily.     . Multiple Vitamin (MULTIVITAMIN) capsule Take 1 capsule by mouth daily.    . Omega-3 Fatty Acids (FISH OIL) 1000 MG CAPS Take 1 capsule by mouth daily.      . ondansetron (ZOFRAN ODT) 4 MG disintegrating tablet Take 1 tablet (4 mg total) by mouth every 8 (eight) hours as needed for nausea. 6 tablet 0  . vitamin C (ASCORBIC ACID) 500 MG tablet Take 500 mg by mouth daily.       Facility-Administered Medications Prior to Visit  Medication Dose Route Frequency Provider Last Rate Last Dose  . methylPREDNISolone acetate (DEPO-MEDROL) injection 80 mg  80 mg Intramuscular Once Lyndal Pulley, DO        ROS Review of Systems  Constitutional: Negative for chills, activity change, appetite change, fatigue and unexpected weight change.  HENT: Negative for congestion, mouth sores and sinus pressure.   Eyes: Negative for visual disturbance.  Respiratory: Negative for cough and chest tightness.   Gastrointestinal: Negative for nausea and abdominal pain.  Genitourinary: Negative for frequency, difficulty urinating and vaginal pain.  Musculoskeletal: Negative for back pain and gait problem.  Skin: Negative for pallor and rash.  Neurological: Positive for light-headedness and headaches. Negative for dizziness, tremors, weakness and numbness.  Psychiatric/Behavioral: Negative for confusion and sleep disturbance. The patient is not nervous/anxious.     Objective:  BP 118/80 mmHg  Pulse 70  Wt 98 lb (44.453 kg)  SpO2 97%  BP Readings from Last 3  Encounters:  12/02/15 118/80  11/24/15 123/66  10/28/15 110/80    Wt Readings from Last 3 Encounters:  12/02/15 98 lb (44.453 kg)  10/28/15 98 lb (44.453 kg)  09/26/15 100 lb (45.36 kg)    Physical Exam  Constitutional: She appears well-developed. No distress.  HENT:  Head: Normocephalic.  Right Ear: External ear normal.  Left Ear: External ear normal.  Nose: Nose normal.  Mouth/Throat: Oropharynx is clear and moist.  Eyes: Conjunctivae are normal. Pupils are equal, round, and reactive to light. Right eye exhibits no discharge. Left eye exhibits no discharge.  Neck: Normal range of motion. Neck supple. No JVD present. No tracheal deviation present. No thyromegaly present.  Cardiovascular: Normal rate, regular rhythm and normal heart sounds.   Pulmonary/Chest: No stridor. No respiratory distress. She has no wheezes.  Abdominal: Soft. Bowel sounds are normal. She exhibits no distension and no mass. There is no tenderness. There is no rebound and no guarding.  Musculoskeletal: She exhibits no edema or tenderness.  Lymphadenopathy:    She has no cervical adenopathy.  Neurological: She displays normal reflexes. No cranial nerve deficit. She exhibits normal muscle tone. Coordination normal.  Skin: No rash noted. No erythema.  Psychiatric: She has a normal mood and affect. Her behavior is normal. Judgment and thought content normal.  mild carotid bruit B  Lab Results  Component Value Date   WBC 6.4 12/02/2015   HGB 14.4 12/02/2015   HCT 42.9 12/02/2015  PLT 281.0 12/02/2015   GLUCOSE 102* 12/02/2015   CHOL 205* 10/23/2014   TRIG 82.0 10/23/2014   HDL 91.10 10/23/2014   LDLDIRECT 107.6 08/15/2012   LDLCALC 98 10/23/2014   ALT 17 12/02/2015   AST 36 12/02/2015   NA 142 12/02/2015   K 5.3* 12/02/2015   CL 102 12/02/2015   CREATININE 0.70 12/02/2015   BUN 14 12/02/2015   CO2 30 12/02/2015   TSH 3.71 12/02/2015    Ct Head Wo Contrast  11/24/2015  CLINICAL DATA:  Weakness,  nausea, LEFT arm numbness and hot feeling, now with headache EXAM: CT HEAD WITHOUT CONTRAST TECHNIQUE: Contiguous axial images were obtained from the base of the skull through the vertex without intravenous contrast. COMPARISON:  None FINDINGS: Generalized atrophy. Normal ventricular morphology. No midline shift or mass effect. Normal appearance of brain parenchyma. No intracranial hemorrhage, mass lesion, or evidence acute infarction. No extra-axial fluid collections. Visualized paranasal sinuses and mastoid air cells clear. No acute osseous findings. IMPRESSION: Atrophy. No acute intracranial abnormalities. Electronically Signed   By: Lavonia Dana M.D.   On: 11/24/2015 15:23    Assessment & Plan:   Diagnoses and all orders for this visit:  Tension-type headache, not intractable, unspecified chronicity pattern -     Basic metabolic panel; Future -     CBC with Differential/Platelet; Future -     Hepatic function panel; Future -     TSH; Future -     Urinalysis; Future -     Sedimentation rate; Future -     Hepatitis C antibody; Future  Lightheadedness -     Basic metabolic panel; Future -     CBC with Differential/Platelet; Future -     Hepatic function panel; Future -     TSH; Future -     Urinalysis; Future -     Sedimentation rate; Future -     Hepatitis C antibody; Future  I am having Ms. Kuhrt maintain her cholecalciferol, Fish Oil, vitamin C, multivitamin, Biotin, and ondansetron. We will continue to administer methylPREDNISolone acetate.  No orders of the defined types were placed in this encounter.     Follow-up: Return in about 4 weeks (around 12/30/2015) for a follow-up visit.  Walker Kehr, MD

## 2015-12-02 NOTE — Progress Notes (Signed)
Pre visit review using our clinic review tool, if applicable. No additional management support is needed unless otherwise documented below in the visit note. 

## 2015-12-03 LAB — HEPATITIS C ANTIBODY: HCV AB: NEGATIVE

## 2015-12-17 ENCOUNTER — Other Ambulatory Visit: Payer: Self-pay | Admitting: *Deleted

## 2015-12-17 DIAGNOSIS — R42 Dizziness and giddiness: Secondary | ICD-10-CM

## 2015-12-17 DIAGNOSIS — R0989 Other specified symptoms and signs involving the circulatory and respiratory systems: Secondary | ICD-10-CM

## 2015-12-30 ENCOUNTER — Ambulatory Visit (INDEPENDENT_AMBULATORY_CARE_PROVIDER_SITE_OTHER): Payer: PPO | Admitting: Internal Medicine

## 2015-12-30 ENCOUNTER — Encounter: Payer: Self-pay | Admitting: Internal Medicine

## 2015-12-30 VITALS — BP 110/78 | HR 69 | Wt 99.0 lb

## 2015-12-30 DIAGNOSIS — G44209 Tension-type headache, unspecified, not intractable: Secondary | ICD-10-CM

## 2015-12-30 NOTE — Progress Notes (Signed)
Pre visit review using our clinic review tool, if applicable. No additional management support is needed unless otherwise documented below in the visit note. 

## 2015-12-30 NOTE — Progress Notes (Signed)
Subjective:  Patient ID: [name], female    DOB: 28-Mar-1946  Age: 70 y.o. MRN: [id number]  CC: No chief complaint on file.   HPI KEMORIA MACFADYEN presents for short episodic HAs "fade in- fade out", non-exertional; daytime. 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 Biotin 800 MCG TABS Take 1 tablet by mouth daily.    . cholecalciferol (VITAMIN D) 1000 UNITS tablet Take 3,000 Units by mouth daily.     . Multiple Vitamin (MULTIVITAMIN) capsule Take 1 capsule by mouth daily.    . Omega-3 Fatty Acids (FISH OIL) 1000 MG CAPS Take 1 capsule by mouth daily.      . vitamin C (ASCORBIC ACID) 500 MG tablet Take 500 mg by mouth daily.      . ondansetron (ZOFRAN ODT) 4 MG disintegrating tablet Take 1 tablet (4 mg total) by mouth every 8 (eight) hours as needed for nausea. (Patient not taking: Reported on 12/30/2015) 6 tablet 0   Facility-Administered Medications Prior to Visit  Medication Dose Route Frequency Provider Last Rate Last Dose  . methylPREDNISolone acetate (DEPO-MEDROL) injection 80 mg  80 mg Intramuscular Once Lyndal Pulley, DO        ROS Review of Systems  Constitutional: Negative for chills, activity change, appetite change, fatigue and unexpected weight change.  HENT: Negative for congestion, mouth sores and sinus pressure.   Eyes: Negative for visual disturbance.  Respiratory: Negative for cough and chest tightness.   Gastrointestinal: Negative for nausea and abdominal pain.  Genitourinary: Negative for frequency, difficulty urinating and vaginal pain.  Musculoskeletal: Negative for back pain and gait problem.  Skin: Negative for pallor and rash.  Neurological: Positive for headaches. Negative for dizziness, tremors, weakness and numbness.  Psychiatric/Behavioral: Negative for confusion and sleep disturbance. The patient is not nervous/anxious.     Objective:  BP 110/78  mmHg  Pulse 69  Wt 99 lb (44.906 kg)  SpO2 98%  BP Readings from Last 3 Encounters:  12/30/15 110/78  12/02/15 118/80  11/24/15 123/66    Wt Readings from Last 3 Encounters:  12/30/15 99 lb (44.906 kg)  12/02/15 98 lb (44.453 kg)  10/28/15 98 lb (44.453 kg)    Physical Exam  Constitutional: She appears well-developed. No distress.  HENT:  Head: Normocephalic.  Right Ear: External ear normal.  Left Ear: External ear normal.  Nose: Nose normal.  Mouth/Throat: Oropharynx is clear and moist.  Eyes: Conjunctivae are normal. Pupils are equal, round, and reactive to light. Right eye exhibits no discharge. Left eye exhibits no discharge.  Neck: Normal range of motion. Neck supple. No JVD present. No tracheal deviation present. No thyromegaly present.  Cardiovascular: Normal rate, regular rhythm and normal heart sounds.   Pulmonary/Chest: No stridor. No respiratory distress. She has no wheezes.  Abdominal: Soft. Bowel sounds are normal. She exhibits no distension and no mass. There is no tenderness. There is no rebound and no guarding.  Musculoskeletal: She exhibits no edema or tenderness.  Lymphadenopathy:    She has no cervical adenopathy.  Neurological: She displays normal reflexes. No cranial nerve deficit. She exhibits normal muscle tone. Coordination normal.  Skin: No rash noted. No erythema.  Psychiatric: She has a normal mood and affect. Her behavior is normal. Judgment and thought content normal.   >20 min  Lab Results  Component Value Date   WBC 6.4 12/02/2015  HGB 14.4 12/02/2015   HCT 42.9 12/02/2015   PLT 281.0 12/02/2015   GLUCOSE 102* 12/02/2015   CHOL 205* 10/23/2014   TRIG 82.0 10/23/2014   HDL 91.10 10/23/2014   LDLDIRECT 107.6 08/15/2012   LDLCALC 98 10/23/2014   ALT 17 12/02/2015   AST 36 12/02/2015   NA 142 12/02/2015   K 5.3* 12/02/2015   CL 102 12/02/2015   CREATININE 0.70 12/02/2015   BUN 14 12/02/2015   CO2 30 12/02/2015   TSH 3.71  12/02/2015    Ct Head Wo Contrast  11/24/2015  CLINICAL DATA:  Weakness, nausea, LEFT arm numbness and hot feeling, now with headache EXAM: CT HEAD WITHOUT CONTRAST TECHNIQUE: Contiguous axial images were obtained from the base of the skull through the vertex without intravenous contrast. COMPARISON:  None FINDINGS: Generalized atrophy. Normal ventricular morphology. No midline shift or mass effect. Normal appearance of brain parenchyma. No intracranial hemorrhage, mass lesion, or evidence acute infarction. No extra-axial fluid collections. Visualized paranasal sinuses and mastoid air cells clear. No acute osseous findings. IMPRESSION: Atrophy. No acute intracranial abnormalities. Electronically Signed   By: Lavonia Dana M.D.   On: 11/24/2015 15:23    Assessment & Plan:   There are no diagnoses linked to this encounter. I am having Ms. Down maintain her cholecalciferol, Fish Oil, vitamin C, multivitamin, Biotin, and ondansetron. We will continue to administer methylPREDNISolone acetate.  No orders of the defined types were placed in this encounter.     Follow-up: No Follow-up on file.  Walker Kehr, MD

## 2015-12-30 NOTE — Assessment & Plan Note (Addendum)
3/17 ?etiol Head CT, labs reviewed short episodic HAs "fade in- fade out", non-exertional; daytime.  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 [facility] ref was offered

## 2016-02-11 ENCOUNTER — Ambulatory Visit (HOSPITAL_COMMUNITY)
Admission: RE | Admit: 2016-02-11 | Discharge: 2016-02-11 | Disposition: A | Discharge status: Home / Self Care | Payer: PPO | Source: Ambulatory Visit | Attending: Cardiology | Admitting: Cardiology

## 2016-02-11 DIAGNOSIS — R0989 Other specified symptoms and signs involving the circulatory and respiratory systems: Secondary | ICD-10-CM | POA: Insufficient documentation

## 2016-02-11 DIAGNOSIS — I6523 Occlusion and stenosis of bilateral carotid arteries: Secondary | ICD-10-CM | POA: Insufficient documentation

## 2016-02-11 DIAGNOSIS — R42 Dizziness and giddiness: Secondary | ICD-10-CM | POA: Diagnosis not present

## 2016-03-05 DIAGNOSIS — H04121 Dry eye syndrome of right lacrimal gland: Secondary | ICD-10-CM | POA: Diagnosis not present

## 2016-03-05 DIAGNOSIS — H5711 Ocular pain, right eye: Secondary | ICD-10-CM | POA: Diagnosis not present

## 2016-03-23 ENCOUNTER — Other Ambulatory Visit: Payer: Self-pay | Admitting: Internal Medicine

## 2016-03-23 ENCOUNTER — Other Ambulatory Visit: Payer: Self-pay | Admitting: Gynecology

## 2016-03-23 DIAGNOSIS — Z1231 Encounter for screening mammogram for malignant neoplasm of breast: Secondary | ICD-10-CM

## 2016-04-21 ENCOUNTER — Ambulatory Visit
Admission: RE | Admit: 2016-04-21 | Discharge: 2016-04-21 | Disposition: A | Discharge status: Home / Self Care | Payer: PPO | Source: Ambulatory Visit | Attending: Internal Medicine | Admitting: Internal Medicine

## 2016-04-21 DIAGNOSIS — Z1231 Encounter for screening mammogram for malignant neoplasm of breast: Secondary | ICD-10-CM | POA: Diagnosis not present

## 2016-04-23 DIAGNOSIS — D225 Melanocytic nevi of trunk: Secondary | ICD-10-CM | POA: Diagnosis not present

## 2016-04-23 DIAGNOSIS — L821 Other seborrheic keratosis: Secondary | ICD-10-CM | POA: Diagnosis not present

## 2016-04-23 DIAGNOSIS — Z85828 Personal history of other malignant neoplasm of skin: Secondary | ICD-10-CM | POA: Diagnosis not present

## 2016-04-27 ENCOUNTER — Other Ambulatory Visit (INDEPENDENT_AMBULATORY_CARE_PROVIDER_SITE_OTHER): Payer: PPO

## 2016-04-27 ENCOUNTER — Encounter: Payer: Self-pay | Admitting: Internal Medicine

## 2016-04-27 ENCOUNTER — Ambulatory Visit (INDEPENDENT_AMBULATORY_CARE_PROVIDER_SITE_OTHER): Payer: PPO | Admitting: Internal Medicine

## 2016-04-27 VITALS — BP 108/68 | HR 57 | Ht 66.0 in | Wt 100.0 lb

## 2016-04-27 DIAGNOSIS — M542 Cervicalgia: Secondary | ICD-10-CM | POA: Insufficient documentation

## 2016-04-27 DIAGNOSIS — E785 Hyperlipidemia, unspecified: Secondary | ICD-10-CM | POA: Diagnosis not present

## 2016-04-27 DIAGNOSIS — Z0000 Encounter for general adult medical examination without abnormal findings: Secondary | ICD-10-CM

## 2016-04-27 LAB — BASIC METABOLIC PANEL
BUN: 13 mg/dL (ref 6–23)
CHLORIDE: 103 meq/L (ref 96–112)
CO2: 29 meq/L (ref 19–32)
Calcium: 9.8 mg/dL (ref 8.4–10.5)
Creatinine, Ser: 0.66 mg/dL (ref 0.40–1.20)
GFR: 93.95 mL/min (ref >60.00)
GLUCOSE: 94 mg/dL (ref 70–99)
POTASSIUM: 4.4 meq/L (ref 3.5–5.1)
Sodium: 140 mEq/L (ref 135–145)

## 2016-04-27 LAB — LIPID PANEL
Cholesterol: 203 mg/dL — ABNORMAL HIGH (ref 0–200)
HDL: 91.1 mg/dL (ref >39.00)
LDL CALC: 101 mg/dL — AB (ref 0–99)
NONHDL: 112
Total CHOL/HDL Ratio: 2
Triglycerides: 57 mg/dL (ref 0.0–149.0)
VLDL: 11.4 mg/dL (ref 0.0–40.0)

## 2016-04-27 MED ORDER — MELOXICAM 7.5 MG PO TABS: 7.5000 mg | ORAL_TABLET | Freq: Every day | ORAL | 1 refills | Status: DC

## 2016-04-27 NOTE — Progress Notes (Signed)
Subjective:  Patient ID: [name], female    DOB: [birth year]  Age: 70 y.o. MRN: [id number]  CC: No chief complaint on file.   HPI [name] presents for a well exam C/o neck pain on L C/o R 3d finger - catching  Outpatient Medications Prior to Visit  Medication Sig Dispense Refill  . Biotin 800 MCG TABS Take 1 tablet by mouth daily.    . cholecalciferol (VITAMIN D) 1000 UNITS tablet Take 2,000 Units by mouth daily.     . Multiple Vitamin (MULTIVITAMIN) capsule Take 1 capsule by mouth once a week.     . Omega-3 Fatty Acids (FISH OIL) 1000 MG CAPS Take 1 capsule by mouth daily.      . vitamin C (ASCORBIC ACID) 500 MG tablet Take 500 mg by mouth daily.      . ondansetron (ZOFRAN ODT) 4 MG disintegrating tablet Take 1 tablet (4 mg total) by mouth every 8 (eight) hours as needed for nausea. (Patient not taking: Reported on 04/27/2016) 6 tablet 0   Facility-Administered Medications Prior to Visit  Medication Dose Route Frequency Provider Last Rate Last Dose  . methylPREDNISolone acetate (DEPO-MEDROL) injection 80 mg  80 mg Intramuscular Once Lyndal Pulley, DO        ROS Review of Systems  Constitutional: Negative for activity change, appetite change, chills, fatigue and unexpected weight change.  HENT: Negative for congestion, mouth sores and sinus pressure.   Eyes: Negative for visual disturbance.  Respiratory: Negative for cough and chest tightness.   Gastrointestinal: Negative for abdominal pain and nausea.  Genitourinary: Negative for difficulty urinating, frequency and vaginal pain.  Musculoskeletal: Positive for arthralgias and neck pain. Negative for back pain and gait problem.  Skin: Negative for pallor and rash.  Neurological: Negative for dizziness, tremors, weakness, numbness and headaches.  Psychiatric/Behavioral: Negative for confusion and sleep disturbance.    Objective:  BP 108/68   Pulse (!) 57   Ht 5\' 6"  (1.676 m)   Wt 100 lb (45.4 kg)   SpO2 96%    BMI 16.14 kg/m   BP Readings from Last 3 Encounters:  04/27/16 108/68  12/30/15 110/78  12/02/15 118/80    Wt Readings from Last 3 Encounters:  04/27/16 100 lb (45.4 kg)  12/30/15 99 lb (44.9 kg)  12/02/15 98 lb (44.5 kg)    Physical Exam  Constitutional: She appears well-developed. No distress.  HENT:  Head: Normocephalic.  Right Ear: External ear normal.  Left Ear: External ear normal.  Nose: Nose normal.  Mouth/Throat: Oropharynx is clear and moist.  Eyes: Conjunctivae are normal. Pupils are equal, round, and reactive to light. Right eye exhibits no discharge. Left eye exhibits no discharge.  Neck: Normal range of motion. Neck supple. No JVD present. No tracheal deviation present. No thyromegaly present.  Cardiovascular: Normal rate, regular rhythm and normal heart sounds.   Pulmonary/Chest: No stridor. No respiratory distress. She has no wheezes.  Abdominal: Soft. Bowel sounds are normal. She exhibits no distension and no mass. There is no tenderness. There is no rebound and no guarding.  Musculoskeletal: She exhibits tenderness. She exhibits no edema.  Lymphadenopathy:    She has no cervical adenopathy.  Neurological: She displays normal reflexes. No cranial nerve deficit. She exhibits normal muscle tone. Coordination normal.  Skin: No rash noted. No erythema.  Psychiatric: She has a normal mood and affect. Her behavior is normal. Judgment and thought content normal.  L trap is tender  Lab  Results  Component Value Date   WBC 6.4 12/02/2015   HGB 14.4 12/02/2015   HCT 42.9 12/02/2015   PLT 281.0 12/02/2015   GLUCOSE 102 (H) 12/02/2015   CHOL 205 (H) 10/23/2014   TRIG 82.0 10/23/2014   HDL 91.10 10/23/2014   LDLDIRECT 107.6 08/15/2012   LDLCALC 98 10/23/2014   ALT 17 12/02/2015   AST 36 12/02/2015   NA 142 12/02/2015   K 5.3 (H) 12/02/2015   CL 102 12/02/2015   CREATININE 0.70 12/02/2015   BUN 14 12/02/2015   CO2 30 12/02/2015   TSH 3.71 12/02/2015     Mm Screening Breast Tomo Bilateral  Result Date: 04/21/2016 CLINICAL DATA:  Screening. EXAM: 2D DIGITAL SCREENING BILATERAL MAMMOGRAM WITH CAD AND ADJUNCT TOMO COMPARISON:  Previous exam(s). ACR Breast Density Category d: The breast tissue is extremely dense, which lowers the sensitivity of mammography. FINDINGS: There are no findings suspicious for malignancy. Images were processed with CAD. IMPRESSION: No mammographic evidence of malignancy. A result letter of this screening mammogram will be mailed directly to the patient. RECOMMENDATION: Screening mammogram in one year. (Code:SM-B-01Y) BI-RADS CATEGORY  1: Negative. Electronically Signed   By: Ammie Ferrier M.D.   On: 04/21/2016 15:03    Assessment & Plan:   There are no diagnoses linked to this encounter. I am having [name] maintain her cholecalciferol, Fish Oil, vitamin C, multivitamin, Biotin, and ondansetron. We will continue to administer methylPREDNISolone acetate.  No orders of the defined types were placed in this encounter.    Follow-up: No Follow-up on file.  Walker Kehr, MD

## 2016-04-27 NOTE — Assessment & Plan Note (Signed)
[facility] Discussed options: accupuncture

## 2016-04-27 NOTE — Progress Notes (Signed)
Pre visit review using our clinic review tool, if applicable. No additional management support is needed unless otherwise documented below in the visit note. 

## 2016-04-27 NOTE — Patient Instructions (Signed)
Cervical Strain and Sprain With Rehab Cervical strain and sprain are injuries that commonly occur with "whiplash" injuries. Whiplash occurs when the neck is forcefully whipped backward or forward, such as during a motor vehicle accident or during contact sports. The muscles, ligaments, tendons, discs, and nerves of the neck are susceptible to injury when this occurs. RISK FACTORS Risk of having a whiplash injury increases if:  Osteoarthritis of the spine.  Situations that make head or neck accidents or trauma more likely.  High-risk sports (football, rugby, wrestling, hockey, auto racing, gymnastics, diving, contact karate, or boxing).  Poor strength and flexibility of the neck.  Previous neck injury.  Poor tackling technique.  Improperly fitted or padded equipment. SYMPTOMS   Pain or stiffness in the front or back of neck or both.  Symptoms may present immediately or up to 24 hours after injury.  Dizziness, headache, nausea, and vomiting.  Muscle spasm with soreness and stiffness in the neck.  Tenderness and swelling at the injury site. PREVENTION  Learn and use proper technique (avoid tackling with the head, spearing, and head-butting; use proper falling techniques to avoid landing on the head).  Warm up and stretch properly before activity.  Maintain physical fitness:  Strength, flexibility, and endurance.  Cardiovascular fitness.  Wear properly fitted and padded protective equipment, such as padded soft collars, for participation in contact sports. PROGNOSIS  Recovery from cervical strain and sprain injuries is dependent on the extent of the injury. These injuries are usually curable in 1 week to 3 months with appropriate treatment.  RELATED COMPLICATIONS   Temporary numbness and weakness may occur if the nerve roots are damaged, and this may persist until the nerve has completely healed.  Chronic pain due to frequent recurrence of symptoms.  Prolonged healing,  especially if activity is resumed too soon (before complete recovery). TREATMENT  Treatment initially involves the use of ice and medication to help reduce pain and inflammation. It is also important to perform strengthening and stretching exercises and modify activities that worsen symptoms so the injury does not get worse. These exercises may be performed at home or with a therapist. For patients who experience severe symptoms, a soft, padded collar may be recommended to be worn around the neck.  Improving your posture may help reduce symptoms. Posture improvement includes pulling your chin and abdomen in while sitting or standing. If you are sitting, sit in a firm chair with your buttocks against the back of the chair. While sleeping, try replacing your pillow with a small towel rolled to 2 inches in diameter, or use a cervical pillow or soft cervical collar. Poor sleeping positions delay healing.  For patients with nerve root damage, which causes numbness or weakness, the use of a cervical traction apparatus may be recommended. Surgery is rarely necessary for these injuries. However, cervical strain and sprains that are present at birth (congenital) may require surgery. MEDICATION   If pain medication is necessary, nonsteroidal anti-inflammatory medications, such as aspirin and ibuprofen, or other minor pain relievers, such as acetaminophen, are often recommended.  Do not take pain medication for 7 days before surgery.  Prescription pain relievers may be given if deemed necessary by your caregiver. Use only as directed and only as much as you need. HEAT AND COLD:   Cold treatment (icing) relieves pain and reduces inflammation. Cold treatment should be applied for 10 to 15 minutes every 2 to 3 hours for inflammation and pain and immediately after any activity that aggravates your  symptoms. Use ice packs or an ice massage.  Heat treatment may be used prior to performing the stretching and  strengthening activities prescribed by your caregiver, physical therapist, or athletic trainer. Use a heat pack or a warm soak. SEEK MEDICAL CARE IF:   Symptoms get worse or do not improve in 2 weeks despite treatment.  New, unexplained symptoms develop (drugs used in treatment may produce side effects). EXERCISES RANGE OF MOTION (ROM) AND STRETCHING EXERCISES - Cervical Strain and Sprain These exercises may help you when beginning to rehabilitate your injury. In order to successfully resolve your symptoms, you must improve your posture. These exercises are designed to help reduce the forward-head and rounded-shoulder posture which contributes to this condition. Your symptoms may resolve with or without further involvement from your physician, physical therapist or athletic trainer. While completing these exercises, remember:   Restoring tissue flexibility helps normal motion to return to the joints. This allows healthier, less painful movement and activity.  An effective stretch should be held for at least 20 seconds, although you may need to begin with shorter hold times for comfort.  A stretch should never be painful. You should only feel a gentle lengthening or release in the stretched tissue. STRETCH- Axial Extensors  Lie on your back on the floor. You may bend your knees for comfort. Place a rolled-up hand towel or dish towel, about 2 inches in diameter, under the part of your head that makes contact with the floor.  Gently tuck your chin, as if trying to make a "double chin," until you feel a gentle stretch at the base of your head.  Hold __________ seconds. Repeat __________ times. Complete this exercise __________ times per day.  STRETCH - Axial Extension   Stand or sit on a firm surface. Assume a good posture: chest up, shoulders drawn back, abdominal muscles slightly tense, knees unlocked (if standing) and feet hip width apart.  Slowly retract your chin so your head slides back  and your chin slightly lowers. Continue to look straight ahead.  You should feel a gentle stretch in the back of your head. Be certain not to feel an aggressive stretch since this can cause headaches later.  Hold for __________ seconds. Repeat __________ times. Complete this exercise __________ times per day. STRETCH - Cervical Side Bend   Stand or sit on a firm surface. Assume a good posture: chest up, shoulders drawn back, abdominal muscles slightly tense, knees unlocked (if standing) and feet hip width apart.  Without letting your nose or shoulders move, slowly tip your right / left ear to your shoulder until your feel a gentle stretch in the muscles on the opposite side of your neck.  Hold __________ seconds. Repeat __________ times. Complete this exercise __________ times per day. STRETCH - Cervical Rotators   Stand or sit on a firm surface. Assume a good posture: chest up, shoulders drawn back, abdominal muscles slightly tense, knees unlocked (if standing) and feet hip width apart.  Keeping your eyes level with the ground, slowly turn your head until you feel a gentle stretch along the back and opposite side of your neck.  Hold __________ seconds. Repeat __________ times. Complete this exercise __________ times per day. RANGE OF MOTION - Neck Circles   Stand or sit on a firm surface. Assume a good posture: chest up, shoulders drawn back, abdominal muscles slightly tense, knees unlocked (if standing) and feet hip width apart.  Gently roll your head down and around from the back  of one shoulder to the back of the other. The motion should never be forced or painful.  Repeat the motion 10-20 times, or until you feel the neck muscles relax and loosen. Repeat __________ times. Complete the exercise __________ times per day. STRENGTHENING EXERCISES - Cervical Strain and Sprain These exercises may help you when beginning to rehabilitate your injury. They may resolve your symptoms with or  without further involvement from your physician, physical therapist, or athletic trainer. While completing these exercises, remember:   Muscles can gain both the endurance and the strength needed for everyday activities through controlled exercises.  Complete these exercises as instructed by your physician, physical therapist, or athletic trainer. Progress the resistance and repetitions only as guided.  You may experience muscle soreness or fatigue, but the pain or discomfort you are trying to eliminate should never worsen during these exercises. If this pain does worsen, stop and make certain you are following the directions exactly. If the pain is still present after adjustments, discontinue the exercise until you can discuss the trouble with your clinician. STRENGTH - Cervical Flexors, Isometric  Face a wall, standing about 6 inches away. Place a small pillow, a ball about 6-8 inches in diameter, or a folded towel between your forehead and the wall.  Slightly tuck your chin and gently push your forehead into the soft object. Push only with mild to moderate intensity, building up tension gradually. Keep your jaw and forehead relaxed.  Hold 10 to 20 seconds. Keep your breathing relaxed.  Release the tension slowly. Relax your neck muscles completely before you start the next repetition. Repeat __________ times. Complete this exercise __________ times per day. STRENGTH- Cervical Lateral Flexors, Isometric   Stand about 6 inches away from a wall. Place a small pillow, a ball about 6-8 inches in diameter, or a folded towel between the side of your head and the wall.  Slightly tuck your chin and gently tilt your head into the soft object. Push only with mild to moderate intensity, building up tension gradually. Keep your jaw and forehead relaxed.  Hold 10 to 20 seconds. Keep your breathing relaxed.  Release the tension slowly. Relax your neck muscles completely before you start the next  repetition. Repeat __________ times. Complete this exercise __________ times per day. STRENGTH - Cervical Extensors, Isometric   Stand about 6 inches away from a wall. Place a small pillow, a ball about 6-8 inches in diameter, or a folded towel between the back of your head and the wall.  Slightly tuck your chin and gently tilt your head back into the soft object. Push only with mild to moderate intensity, building up tension gradually. Keep your jaw and forehead relaxed.  Hold 10 to 20 seconds. Keep your breathing relaxed.  Release the tension slowly. Relax your neck muscles completely before you start the next repetition. Repeat __________ times. Complete this exercise __________ times per day. POSTURE AND BODY MECHANICS CONSIDERATIONS - Cervical Strain and Sprain Keeping correct posture when sitting, standing or completing your activities will reduce the stress put on different body tissues, allowing injured tissues a chance to heal and limiting painful experiences. The following are general guidelines for improved posture. Your physician or physical therapist will provide you with any instructions specific to your needs. While reading these guidelines, remember:  The exercises prescribed by your provider will help you have the flexibility and strength to maintain correct postures.  The correct posture provides the optimal environment for your joints to work.  All of your joints have less wear and tear when properly supported by a spine with good posture. This means you will experience a healthier, less painful body.  Correct posture must be practiced with all of your activities, especially prolonged sitting and standing. Correct posture is as important when doing repetitive low-stress activities (typing) as it is when doing a single heavy-load activity (lifting). PROLONGED STANDING WHILE SLIGHTLY LEANING FORWARD When completing a task that requires you to lean forward while standing in one  place for a long time, place either foot up on a stationary 2- to 4-inch high object to help maintain the best posture. When both feet are on the ground, the low back tends to lose its slight inward curve. If this curve flattens (or becomes too large), then the back and your other joints will experience too much stress, fatigue more quickly, and can cause pain.  RESTING POSITIONS Consider which positions are most painful for you when choosing a resting position. If you have pain with flexion-based activities (sitting, bending, stooping, squatting), choose a position that allows you to rest in a less flexed posture. You would want to avoid curling into a fetal position on your side. If your pain worsens with extension-based activities (prolonged standing, working overhead), avoid resting in an extended position such as sleeping on your stomach. Most people will find more comfort when they rest with their spine in a more neutral position, neither too rounded nor too arched. Lying on a non-sagging bed on your side with a pillow between your knees, or on your back with a pillow under your knees will often provide some relief. Keep in mind, being in any one position for a prolonged period of time, no matter how correct your posture, can still lead to stiffness. WALKING Walk with an upright posture. Your ears, shoulders, and hips should all line up. OFFICE WORK When working at a desk, create an environment that supports good, upright posture. Without extra support, muscles fatigue and lead to excessive strain on joints and other tissues. CHAIR:  A chair should be able to slide under your desk when your back makes contact with the back of the chair. This allows you to work closely.  The chair's height should allow your eyes to be level with the upper part of your monitor and your hands to be slightly lower than your elbows.  Body position:  Your feet should make contact with the floor. If this is not  possible, use a foot rest.  Keep your ears over your shoulders. This will reduce stress on your neck and low back.   This information is not intended to replace advice given to you by your health care provider. Make sure you discuss any questions you have with your health care provider.   Document Released: 09/07/2005 Document Revised: 09/28/2014 Document Reviewed: 12/20/2008 Elsevier Interactive Patient Education 2016 [place] for Adults, Female A healthy lifestyle and preventive care can promote health and wellness. Preventive health guidelines for women include the following key practices.  A routine yearly physical is a good way to check with your health care provider about your health and preventive screening. It is a chance to share any concerns and updates on your health and to receive a thorough exam.  Visit your dentist for a routine exam and preventive care every 6 months. Brush your teeth twice a day and floss once a day. Good oral hygiene prevents tooth decay and gum disease.  The frequency  of eye exams is based on your age, health, family medical history, use of contact lenses, and other factors. Follow your health care provider's recommendations for frequency of eye exams.  Eat a healthy diet. Foods like vegetables, fruits, whole grains, low-fat dairy products, and lean protein foods contain the nutrients you need without too many calories. Decrease your intake of foods high in solid fats, added sugars, and salt. Eat the right amount of calories for you.Get information about a proper diet from your health care provider, if necessary.  Regular physical exercise is one of the most important things you can do for your health. Most adults should get at least 150 minutes of moderate-intensity exercise (any activity that increases your heart rate and causes you to sweat) each week. In addition, most adults need muscle-strengthening exercises on 2 or more days a  week.  Maintain a healthy weight. The body mass index (BMI) is a screening tool to identify possible weight problems. It provides an estimate of body fat based on height and weight. Your health care provider can find your BMI and can help you achieve or maintain a healthy weight.For adults 20 years and older:  A BMI below 18.5 is considered underweight.  A BMI of 18.5 to 24.9 is normal.  A BMI of 25 to 29.9 is considered overweight.  A BMI of 30 and above is considered obese.  Maintain normal blood lipids and cholesterol levels by exercising and minimizing your intake of saturated fat. Eat a balanced diet with plenty of fruit and vegetables. Blood tests for lipids and cholesterol should begin at age 62 and be repeated every 5 years. If your lipid or cholesterol levels are high, you are over 50, or you are at high risk for heart disease, you may need your cholesterol levels checked more frequently.Ongoing high lipid and cholesterol levels should be treated with medicines if diet and exercise are not working.  If you smoke, find out from your health care provider how to quit. If you do not use tobacco, do not start.  Lung cancer screening is recommended for adults aged 40-80 years who are at high risk for developing lung cancer because of a history of smoking. A yearly low-dose CT scan of the lungs is recommended for people who have at least a 30-pack-year history of smoking and are a current smoker or have quit within the past 15 years. A pack year of smoking is smoking an average of 1 pack of cigarettes a day for 1 year (for example: 1 pack a day for 30 years or 2 packs a day for 15 years). Yearly screening should continue until the smoker has stopped smoking for at least 15 years. Yearly screening should be stopped for people who develop a health problem that would prevent them from having lung cancer treatment.  If you are pregnant, do not drink alcohol. If you are breastfeeding, be very  cautious about drinking alcohol. If you are not pregnant and choose to drink alcohol, do not have more than 1 drink per day. One drink is considered to be 12 ounces (355 mL) of beer, 5 ounces (148 mL) of wine, or 1.5 ounces (44 mL) of liquor.  Avoid use of street drugs. Do not share needles with anyone. Ask for help if you need support or instructions about stopping the use of drugs.  High blood pressure causes heart disease and increases the risk of stroke. Your blood pressure should be checked at least every 1 to 2  years. Ongoing high blood pressure should be treated with medicines if weight loss and exercise do not work.  If you are 30-59 years old, ask your health care provider if you should take aspirin to prevent strokes.  Diabetes screening is done by taking a blood sample to check your blood glucose level after you have not eaten for a certain period of time (fasting). If you are not overweight and you do not have risk factors for diabetes, you should be screened once every 3 years starting at age 10. If you are overweight or obese and you are 52-68 years of age, you should be screened for diabetes every year as part of your cardiovascular risk assessment.  Breast cancer screening is essential preventive care for women. You should practice "breast self-awareness." This means understanding the normal appearance and feel of your breasts and may include breast self-examination. Any changes detected, no matter how small, should be reported to a health care provider. Women in their 57s and 30s should have a clinical breast exam (CBE) by a health care provider as part of a regular health exam every 1 to 3 years. After age 61, women should have a CBE every year. Starting at age 40, women should consider having a mammogram (breast X-ray test) every year. Women who have a family history of breast cancer should talk to their health care provider about genetic screening. Women at a high risk of breast cancer  should talk to their health care providers about having an MRI and a mammogram every year.  Breast cancer gene (BRCA)-related cancer risk assessment is recommended for women who have family members with BRCA-related cancers. BRCA-related cancers include breast, ovarian, tubal, and peritoneal cancers. Having family members with these cancers may be associated with an increased risk for harmful changes (mutations) in the breast cancer genes BRCA1 and BRCA2. Results of the assessment will determine the need for genetic counseling and BRCA1 and BRCA2 testing.  Your health care provider may recommend that you be screened regularly for cancer of the pelvic organs (ovaries, uterus, and vagina). This screening involves a pelvic examination, including checking for microscopic changes to the surface of your cervix (Pap test). You may be encouraged to have this screening done every 3 years, beginning at age 11.  For women ages 80-65, health care providers may recommend pelvic exams and Pap testing every 3 years, or they may recommend the Pap and pelvic exam, combined with testing for human papilloma virus (HPV), every 5 years. Some types of HPV increase your risk of cervical cancer. Testing for HPV may also be done on women of any age with unclear Pap test results.  Other health care providers may not recommend any screening for nonpregnant women who are considered low risk for pelvic cancer and who do not have symptoms. Ask your health care provider if a screening pelvic exam is right for you.  If you have had past treatment for cervical cancer or a condition that could lead to cancer, you need Pap tests and screening for cancer for at least 20 years after your treatment. If Pap tests have been discontinued, your risk factors (such as having a new sexual partner) need to be reassessed to determine if screening should resume. Some women have medical problems that increase the chance of getting cervical cancer. In  these cases, your health care provider may recommend more frequent screening and Pap tests.  Colorectal cancer can be detected and often prevented. Most routine colorectal cancer screening  begins at the age of 33 years and continues through age 20 years. However, your health care provider may recommend screening at an earlier age if you have risk factors for colon cancer. On a yearly basis, your health care provider may provide home test kits to check for hidden blood in the stool. Use of a small camera at the end of a tube, to directly examine the colon (sigmoidoscopy or colonoscopy), can detect the earliest forms of colorectal cancer. Talk to your health care provider about this at age 7, when routine screening begins. Direct exam of the colon should be repeated every 5-10 years through age 68 years, unless early forms of precancerous polyps or small growths are found.  People who are at an increased risk for hepatitis B should be screened for this virus. You are considered at high risk for hepatitis B if:  You were born in a country where hepatitis B occurs often. Talk with your health care provider about which countries are considered high risk.  Your parents were born in a high-risk country and you have not received a shot to protect against hepatitis B (hepatitis B vaccine).  You have HIV or AIDS.  You use needles to inject street drugs.  You live with, or have sex with, someone who has hepatitis B.  You get hemodialysis treatment.  You take certain medicines for conditions like cancer, organ transplantation, and autoimmune conditions.  Hepatitis C blood testing is recommended for all people born from [birth year] through 1965 and any individual with known risks for hepatitis C.  Practice safe sex. Use condoms and avoid high-risk sexual practices to reduce the spread of sexually transmitted infections (STIs). STIs include gonorrhea, chlamydia, syphilis, trichomonas, herpes, HPV, and human  immunodeficiency virus (HIV). Herpes, HIV, and HPV are viral illnesses that have no cure. They can result in disability, cancer, and death.  You should be screened for sexually transmitted illnesses (STIs) including gonorrhea and chlamydia if:  You are sexually active and are younger than 24 years.  You are older than 24 years and your health care provider tells you that you are at risk for this type of infection.  Your sexual activity has changed since you were last screened and you are at an increased risk for chlamydia or gonorrhea. Ask your health care provider if you are at risk.  If you are at risk of being infected with HIV, it is recommended that you take a prescription medicine daily to prevent HIV infection. This is called preexposure prophylaxis (PrEP). You are considered at risk if:  You are sexually active and do not regularly use condoms or know the HIV status of your partner(s).  You take drugs by injection.  You are sexually active with a partner who has HIV.  Talk with your health care provider about whether you are at high risk of being infected with HIV. If you choose to begin PrEP, you should first be tested for HIV. You should then be tested every 3 months for as long as you are taking PrEP.  Osteoporosis is a disease in which the bones lose minerals and strength with aging. This can result in serious bone fractures or breaks. The risk of osteoporosis can be identified using a bone density scan. Women ages 62 years and over and women at risk for fractures or osteoporosis should discuss screening with their health care providers. Ask your health care provider whether you should take a calcium supplement or vitamin D to reduce the  rate of osteoporosis.  Menopause can be associated with physical symptoms and risks. Hormone replacement therapy is available to decrease symptoms and risks. You should talk to your health care provider about whether hormone replacement therapy is  right for you.  Use sunscreen. Apply sunscreen liberally and repeatedly throughout the day. You should seek shade when your shadow is shorter than you. Protect yourself by wearing long sleeves, pants, a wide-brimmed hat, and sunglasses year round, whenever you are outdoors.  Once a month, do a whole body skin exam, using a mirror to look at the skin on your back. Tell your health care provider of new moles, moles that have irregular borders, moles that are larger than a pencil eraser, or moles that have changed in shape or color.  Stay current with required vaccines (immunizations).  Influenza vaccine. All adults should be immunized every year.  Tetanus, diphtheria, and acellular pertussis (Td, Tdap) vaccine. Pregnant women should receive 1 dose of Tdap vaccine during each pregnancy. The dose should be obtained regardless of the length of time since the last dose. Immunization is preferred during the 27th-36th week of gestation. An adult who has not previously received Tdap or who does not know her vaccine status should receive 1 dose of Tdap. This initial dose should be followed by tetanus and diphtheria toxoids (Td) booster doses every 10 years. Adults with an unknown or incomplete history of completing a 3-dose immunization series with Td-containing vaccines should begin or complete a primary immunization series including a Tdap dose. Adults should receive a Td booster every 10 years.  Varicella vaccine. An adult without evidence of immunity to varicella should receive 2 doses or a second dose if she has previously received 1 dose. Pregnant females who do not have evidence of immunity should receive the first dose after pregnancy. This first dose should be obtained before leaving the health care facility. The second dose should be obtained 4-8 weeks after the first dose.  Human papillomavirus (HPV) vaccine. Females aged 13-26 years who have not received the vaccine previously should obtain the  3-dose series. The vaccine is not recommended for use in pregnant females. However, pregnancy testing is not needed before receiving a dose. If a female is found to be pregnant after receiving a dose, no treatment is needed. In that case, the remaining doses should be delayed until after the pregnancy. Immunization is recommended for any person with an immunocompromised condition through the age of 50 years if she did not get any or all doses earlier. During the 3-dose series, the second dose should be obtained 4-8 weeks after the first dose. The third dose should be obtained 24 weeks after the first dose and 16 weeks after the second dose.  Zoster vaccine. One dose is recommended for adults aged 49 years or older unless certain conditions are present.  Measles, mumps, and rubella (MMR) vaccine. Adults born before [birth year] generally are considered immune to measles and mumps. Adults born in [birth year] or later should have 1 or more doses of MMR vaccine unless there is a contraindication to the vaccine or there is laboratory evidence of immunity to each of the three diseases. A routine second dose of MMR vaccine should be obtained at least 28 days after the first dose for students attending postsecondary schools, health care workers, or international travelers. People who received inactivated measles vaccine or an unknown type of measles vaccine during 1963-1967 should receive 2 doses of MMR vaccine. People who received inactivated mumps vaccine or  an unknown type of mumps vaccine before 1979 and are at high risk for mumps infection should consider immunization with 2 doses of MMR vaccine. For females of childbearing age, rubella immunity should be determined. If there is no evidence of immunity, females who are not pregnant should be vaccinated. If there is no evidence of immunity, females who are pregnant should delay immunization until after pregnancy. Unvaccinated health care workers born before [birth year] who lack  laboratory evidence of measles, mumps, or rubella immunity or laboratory confirmation of disease should consider measles and mumps immunization with 2 doses of MMR vaccine or rubella immunization with 1 dose of MMR vaccine.  Pneumococcal 13-valent conjugate (PCV13) vaccine. When indicated, a person who is uncertain of his immunization history and has no record of immunization should receive the PCV13 vaccine. All adults 7 years of age and older should receive this vaccine. An adult aged 61 years or older who has certain medical conditions and has not been previously immunized should receive 1 dose of PCV13 vaccine. This PCV13 should be followed with a dose of pneumococcal polysaccharide (PPSV23) vaccine. Adults who are at high risk for pneumococcal disease should obtain the PPSV23 vaccine at least 8 weeks after the dose of PCV13 vaccine. Adults older than 70 years of age who have normal immune system function should obtain the PPSV23 vaccine dose at least 1 year after the dose of PCV13 vaccine.  Pneumococcal polysaccharide (PPSV23) vaccine. When PCV13 is also indicated, PCV13 should be obtained first. All adults aged 77 years and older should be immunized. An adult younger than age 22 years who has certain medical conditions should be immunized. Any person who resides in a nursing home or long-term care facility should be immunized. An adult smoker should be immunized. People with an immunocompromised condition and certain other conditions should receive both PCV13 and PPSV23 vaccines. People with human immunodeficiency virus (HIV) infection should be immunized as soon as possible after diagnosis. Immunization during chemotherapy or radiation therapy should be avoided. Routine use of PPSV23 vaccine is not recommended for American Indians, [place] Natives, or people younger than 65 years unless there are medical conditions that require PPSV23 vaccine. When indicated, people who have unknown immunization and have  no record of immunization should receive PPSV23 vaccine. One-time revaccination 5 years after the first dose of PPSV23 is recommended for people aged 19-64 years who have chronic kidney failure, nephrotic syndrome, asplenia, or immunocompromised conditions. People who received 1-2 doses of PPSV23 before age 45 years should receive another dose of PPSV23 vaccine at age 41 years or later if at least 5 years have passed since the previous dose. Doses of PPSV23 are not needed for people immunized with PPSV23 at or after age 75 years.  Meningococcal vaccine. Adults with asplenia or persistent complement component deficiencies should receive 2 doses of quadrivalent meningococcal conjugate (MenACWY-D) vaccine. The doses should be obtained at least 2 months apart. Microbiologists working with certain meningococcal bacteria, [employer] recruits, people at risk during an outbreak, and people who travel to or live in countries with a high rate of meningitis should be immunized. A first-year college student up through age 28 years who is living in a residence hall should receive a dose if she did not receive a dose on or after her 16th birthday. Adults who have certain high-risk conditions should receive one or more doses of vaccine.  Hepatitis A vaccine. Adults who wish to be protected from this disease, have certain high-risk conditions, work with hepatitis  A-infected animals, work in hepatitis A research labs, or travel to or work in countries with a high rate of hepatitis A should be immunized. Adults who were previously unvaccinated and who anticipate close contact with an international adoptee during the first 60 days after arrival in the [place] from a country with a high rate of hepatitis A should be immunized.  Hepatitis B vaccine. Adults who wish to be protected from this disease, have certain high-risk conditions, may be exposed to blood or other infectious body fluids, are household contacts or sex  partners of hepatitis B positive people, are clients or workers in certain care facilities, or travel to or work in countries with a high rate of hepatitis B should be immunized.  Haemophilus influenzae type b (Hib) vaccine. A previously unvaccinated person with asplenia or sickle cell disease or having a scheduled splenectomy should receive 1 dose of Hib vaccine. Regardless of previous immunization, a recipient of a hematopoietic stem cell transplant should receive a 3-dose series 6-12 months after her successful transplant. Hib vaccine is not recommended for adults with HIV infection. Preventive Services / Frequency Ages 20 to 45 years  Blood pressure check.** / Every 3-5 years.  Lipid and cholesterol check.** / Every 5 years beginning at age 22.  Clinical breast exam.** / Every 3 years for women in their 29s and 57s.  BRCA-related cancer risk assessment.** / For women who have family members with a BRCA-related cancer (breast, ovarian, tubal, or peritoneal cancers).  Pap test.** / Every 2 years from ages 92 through 70. Every 3 years starting at age 18 through age 38 or 86 with a history of 3 consecutive normal Pap tests.  HPV screening.** / Every 3 years from ages 70 through ages 6 to 63 with a history of 3 consecutive normal Pap tests.  Hepatitis C blood test.** / For any individual with known risks for hepatitis C.  Skin self-exam. / Monthly.  Influenza vaccine. / Every year.  Tetanus, diphtheria, and acellular pertussis (Tdap, Td) vaccine.** / Consult your health care provider. Pregnant women should receive 1 dose of Tdap vaccine during each pregnancy. 1 dose of Td every 10 years.  Varicella vaccine.** / Consult your health care provider. Pregnant females who do not have evidence of immunity should receive the first dose after pregnancy.  HPV vaccine. / 3 doses over 6 months, if 88 and younger. The vaccine is not recommended for use in pregnant females. However, pregnancy testing  is not needed before receiving a dose.  Measles, mumps, rubella (MMR) vaccine.** / You need at least 1 dose of MMR if you were born in 1957 or later. You may also need a 2nd dose. For females of childbearing age, rubella immunity should be determined. If there is no evidence of immunity, females who are not pregnant should be vaccinated. If there is no evidence of immunity, females who are pregnant should delay immunization until after pregnancy.  Pneumococcal 13-valent conjugate (PCV13) vaccine.** / Consult your health care provider.  Pneumococcal polysaccharide (PPSV23) vaccine.** / 1 to 2 doses if you smoke cigarettes or if you have certain conditions.  Meningococcal vaccine.** / 1 dose if you are age 56 to 77 years and a Market researcher living in a residence hall, or have one of several medical conditions, you need to get vaccinated against meningococcal disease. You may also need additional booster doses.  Hepatitis A vaccine.** / Consult your health care provider.  Hepatitis B vaccine.** / Consult your health  care provider.  Haemophilus influenzae type b (Hib) vaccine.** / Consult your health care provider. Ages 15 to 70 years  Blood pressure check.** / Every year.  Lipid and cholesterol check.** / Every 5 years beginning at age 83 years.  Lung cancer screening. / Every year if you are aged 27-80 years and have a 30-pack-year history of smoking and currently smoke or have quit within the past 15 years. Yearly screening is stopped once you have quit smoking for at least 15 years or develop a health problem that would prevent you from having lung cancer treatment.  Clinical breast exam.** / Every year after age 2 years.  BRCA-related cancer risk assessment.** / For women who have family members with a BRCA-related cancer (breast, ovarian, tubal, or peritoneal cancers).  Mammogram.** / Every year beginning at age 38 years and continuing for as long as you are in good  health. Consult with your health care provider.  Pap test.** / Every 3 years starting at age 42 years through age 50 or 3 years with a history of 3 consecutive normal Pap tests.  HPV screening.** / Every 3 years from ages 44 years through ages 20 to 73 years with a history of 3 consecutive normal Pap tests.  Fecal occult blood test (FOBT) of stool. / Every year beginning at age 53 years and continuing until age 62 years. You may not need to do this test if you get a colonoscopy every 10 years.  Flexible sigmoidoscopy or colonoscopy.** / Every 5 years for a flexible sigmoidoscopy or every 10 years for a colonoscopy beginning at age 42 years and continuing until age 53 years.  Hepatitis C blood test.** / For all people born from [birth year] through 1965 and any individual with known risks for hepatitis C.  Skin self-exam. / Monthly.  Influenza vaccine. / Every year.  Tetanus, diphtheria, and acellular pertussis (Tdap/Td) vaccine.** / Consult your health care provider. Pregnant women should receive 1 dose of Tdap vaccine during each pregnancy. 1 dose of Td every 10 years.  Varicella vaccine.** / Consult your health care provider. Pregnant females who do not have evidence of immunity should receive the first dose after pregnancy.  Zoster vaccine.** / 1 dose for adults aged 64 years or older.  Measles, mumps, rubella (MMR) vaccine.** / You need at least 1 dose of MMR if you were born in 1957 or later. You may also need a second dose. For females of childbearing age, rubella immunity should be determined. If there is no evidence of immunity, females who are not pregnant should be vaccinated. If there is no evidence of immunity, females who are pregnant should delay immunization until after pregnancy.  Pneumococcal 13-valent conjugate (PCV13) vaccine.** / Consult your health care provider.  Pneumococcal polysaccharide (PPSV23) vaccine.** / 1 to 2 doses if you smoke cigarettes or if you have certain  conditions.  Meningococcal vaccine.** / Consult your health care provider.  Hepatitis A vaccine.** / Consult your health care provider.  Hepatitis B vaccine.** / Consult your health care provider.  Haemophilus influenzae type b (Hib) vaccine.** / Consult your health care provider. Ages 11 years and over  Blood pressure check.** / Every year.  Lipid and cholesterol check.** / Every 5 years beginning at age 36 years.  Lung cancer screening. / Every year if you are aged 61-80 years and have a 30-pack-year history of smoking and currently smoke or have quit within the past 15 years. Yearly screening is stopped once you have quit  smoking for at least 15 years or develop a health problem that would prevent you from having lung cancer treatment.  Clinical breast exam.** / Every year after age 22 years.  BRCA-related cancer risk assessment.** / For women who have family members with a BRCA-related cancer (breast, ovarian, tubal, or peritoneal cancers).  Mammogram.** / Every year beginning at age 29 years and continuing for as long as you are in good health. Consult with your health care provider.  Pap test.** / Every 3 years starting at age 67 years through age 60 or 15 years with 3 consecutive normal Pap tests. Testing can be stopped between 65 and 70 years with 3 consecutive normal Pap tests and no abnormal Pap or HPV tests in the past 10 years.  HPV screening.** / Every 3 years from ages 36 years through ages 73 or 19 years with a history of 3 consecutive normal Pap tests. Testing can be stopped between 65 and 70 years with 3 consecutive normal Pap tests and no abnormal Pap or HPV tests in the past 10 years.  Fecal occult blood test (FOBT) of stool. / Every year beginning at age 27 years and continuing until age 60 years. You may not need to do this test if you get a colonoscopy every 10 years.  Flexible sigmoidoscopy or colonoscopy.** / Every 5 years for a flexible sigmoidoscopy or every 10  years for a colonoscopy beginning at age 92 years and continuing until age 35 years.  Hepatitis C blood test.** / For all people born from [birth year] through 1965 and any individual with known risks for hepatitis C.  Osteoporosis screening.** / A one-time screening for women ages 41 years and over and women at risk for fractures or osteoporosis.  Skin self-exam. / Monthly.  Influenza vaccine. / Every year.  Tetanus, diphtheria, and acellular pertussis (Tdap/Td) vaccine.** / 1 dose of Td every 10 years.  Varicella vaccine.** / Consult your health care provider.  Zoster vaccine.** / 1 dose for adults aged 75 years or older.  Pneumococcal 13-valent conjugate (PCV13) vaccine.** / Consult your health care provider.  Pneumococcal polysaccharide (PPSV23) vaccine.** / 1 dose for all adults aged 89 years and older.  Meningococcal vaccine.** / Consult your health care provider.  Hepatitis A vaccine.** / Consult your health care provider.  Hepatitis B vaccine.** / Consult your health care provider.  Haemophilus influenzae type b (Hib) vaccine.** / Consult your health care provider. ** Family history and personal history of risk and conditions may change your health care provider's recommendations.   This information is not intended to replace advice given to you by your health care provider. Make sure you discuss any questions you have with your health care provider.   Document Released: 11/03/2001 Document Revised: 09/28/2014 Document Reviewed: 02/02/2011 Elsevier Interactive Patient Education [employer].

## 2016-04-27 NOTE — Assessment & Plan Note (Signed)

## 2016-06-16 ENCOUNTER — Ambulatory Visit (INDEPENDENT_AMBULATORY_CARE_PROVIDER_SITE_OTHER): Payer: PPO

## 2016-06-16 DIAGNOSIS — Z23 Encounter for immunization: Secondary | ICD-10-CM | POA: Diagnosis not present

## 2016-08-03 NOTE — Progress Notes (Signed)
[facility] [address] [place], [place] [address] Phone: [phone] Subjective:     CC: right ankle pain   [id number]  [name] is a 70 y.o. female coming in with complaint of right ankle pain. Pain is intermittent.  intermittent pain in the posterior aspect the ankle. States that it ms to come and go Sometimes can even wake her up at night. Denies any associated back pain . Denies any numbness,denies any weakness. Rates severity 4/10.  No injury. No home modalities tried.      Past Medical History:  Diagnosis Date  . Adenomatous colon polyp   . Atrophic vaginitis   . Benign neoplasm of colon 01/18/2008  . DIVERTICULOSIS, MILD 10/26/2007   colonoscopy 10/26/07  . H pylori ulcer   . HEMORRHOIDS, INTERNAL 01/18/2008  . OSTEOPENIA 08/2014   T score -2.0 FRAX 6.9%/0.9%  . Skin disorder    NOT PARAPSORIASIS--ETIOLOGY UNCLEAR  . Ulcer ([place]) 01/31/06   STOMACH ULCER   Past Surgical History:  Procedure Laterality Date  . APPENDECTOMY  1963  . COLONOSCOPY W/ BIOPSIES     multiple  . ESOPHAGOGASTRODUODENOSCOPY  03/30/2006  . GANGLION CYST EXCISION  1972   Right  . Gastric Ulcer and GI Bleed (hospitalization)  5/07  . LYMPH NODE BIOPSY  2001   Benign from the groin-Inflammatory changes due to her skin condition.  . s/p eradication of H. Pylori     confirmed by urea breath teast  . SKIN CANCER EXCISION     removed from the forehead  . TONSILLECTOMY  1955  . TUBAL LIGATION     Social History   Social History  . Marital status: Married    Spouse name: N/A  . Number of children: N/A  . Years of education: N/A   Occupational History  . Accountant    Social History Main Topics  . Smoking status: Never Smoker  . Smokeless tobacco: Never Used  . Alcohol use 4.2 oz/week    7 Glasses of wine per week  . Drug use: No  . Sexual activity: Yes    Birth control/ protection: Surgical, Post-menopausal     Comment: BTL-1st intercourse 70  yo-Fewer than 5 partners   Other Topics Concern  . None   Social History Narrative   Married without children. 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 3 coffees daily.   Regular exercise-yes   01/09/2015      Allergies  Allergen Reactions  . Fosamax [Alendronate Sodium] Other (See Comments)    Abdominal pain   Family History  Problem Relation Age of Onset  . Breast cancer Mother     Age 43's  . Heart disease Mother   . Heart disease Father   . Breast cancer Maternal Aunt     Age 44's  . Heart disease Maternal Grandmother   . Colon cancer Maternal Grandfather   . Breast cancer Cousin     Mat. cousins-Age late 55's and others 70's and 68's  . Colon cancer Cousin   . Colon cancer Maternal Uncle     Past medical history, social, surgical and family history all reviewed in electronic medical record.  No pertanent information unless stated regarding to the chief complaint.   Review of Systems:Review of systems updated and as accurate as of 08/04/16  No headache, visual changes, nausea, vomiting, diarrhea, constipation, dizziness, abdominal pain, skin rash, fevers, chills, night sweats, weight loss, swollen lymph nodes, body aches, joint swelling,  muscle aches, chest pain, shortness of breath, mood changes.   Objective  Blood pressure 120/70, weight 101 lb (45.8 kg). Systems examined below as of 08/04/16   General: No apparent distress alert and oriented x3 mood and affect normal, dressed appropriately.  HEENT: Pupils equal, extraocular movements intact  Respiratory: Patient's speak in full sentences and does not appear short of breath  Cardiovascular: No lower extremity edema, non tender, no erythema  Skin: Warm dry intact with no signs of infection or rash on extremities or on axial skeleton.  Abdomen: Soft nontender  Neuro: Cranial nerves II through XII are intact, neurovascularly intact in all extremities with 2+ DTRs and 2+ pulses.  Lymph: No lymphadenopathy  of posterior or anterior cervical chain or axillae bilaterally.  Gait normal with good balance and coordination.  MSK:  Non tender with full range of motion and good stability and symmetric strength and tone of shoulders, elbows, wrist, hip, knee and bilaterally.  Ankle:right  No visible erythema or swelling. Range of motion is full in all directions. Strength is 5/5 in all directions. Stable lateral and medial ligaments; squeeze test and kleiger test unremarkable; Talar dome nontender; No pain at base of 5th MT; No tenderness over cuboid; No tenderness over N spot or navicular prominence No tenderness on posterior aspects of lateral and medial malleolus No sign of peroneal tendon subluxations or tenderness to palpation Negative tarsal tunnel tinel's Tightness of the posterior cord noted but symmetric.  Able to walk 4 steps. Contralateral ankle unremarkable.   MSK US performed of: right This study was ordered, performed, and interpreted by Charlann Boxer D.O.  Foot/Ankle:   All structures visualized.   Talar dome unremarkable  Ankle mortise without effusion. Peroneus longus and brevis tendons unremarkable on long and transverse views without sheath effusions. Posterior tibialis, flexor hallucis longus, and flexor digitorum longus tendons unremarkable on long and transverse views without sheath effusions. Achilles tendon visualized along length of tendon and unremarkable on long and transverse views without sheath effusion. Anterior Talofibular Ligament and Calcaneofibular Ligaments unremarkable and intact. Deltoid Ligament unremarkable and intact. Plantar fascia intact and without effusion, normal thickness. No increased doppler signal, cap sign, or thickening of tibial cortex. Power doppler signal normal.  IMPRESSION:  NORMAL ULTRASONOGRAPHIC EXAMINATION OF THE FOOT/ANKLE.  Procedure note [id number]; 15 minutes spent for Therapeutic exercises as stated in above notes.  This included  exercises focusing on stretching, strengthening, with significant focus on eccentric aspects. Ankle strengthening that included:  Basic range of motion exercises to allow proper full motion at ankle Stretching of the lower leg and hamstrings  Theraband exercises for the lower leg - inversion, eversion, dorsiflexion and plantarflexion each to be completed with a theraband Balance exercises to increase proprioception Weight bearing exercises to increase strength and balance   Proper technique shown and discussed handout in great detail with ATC.  All questions were discussed and answered.     Impression and Recommendations:     This case required medical decision making of moderate complexity.      Note: This dictation was prepared with Dragon dictation along with smaller phrase technology. Any transcriptional errors that result from this process are unintentional.

## 2016-08-04 ENCOUNTER — Ambulatory Visit: Payer: Self-pay

## 2016-08-04 ENCOUNTER — Encounter: Payer: Self-pay | Admitting: Family Medicine

## 2016-08-04 ENCOUNTER — Ambulatory Visit (INDEPENDENT_AMBULATORY_CARE_PROVIDER_SITE_OTHER): Payer: PPO | Admitting: Family Medicine

## 2016-08-04 VITALS — BP 120/70 | Ht 66.0 in | Wt 101.0 lb

## 2016-08-04 DIAGNOSIS — M25571 Pain in right ankle and joints of right foot: Secondary | ICD-10-CM

## 2016-08-04 DIAGNOSIS — M7661 Achilles tendinitis, right leg: Secondary | ICD-10-CM | POA: Insufficient documentation

## 2016-08-04 NOTE — Assessment & Plan Note (Signed)
New problem. Mild symptoms, ultrasound look normal, no back pain but lumbar radiculitis is within the differential but negative SLT.  HEP, icing, pennsaid, avoid jumnping RTC in 4 weeks.

## 2016-08-04 NOTE — Patient Instructions (Signed)
Good to see you  Ice is your friend after activity when you have pain  Exercises 3 times a week.  Heel lift in your right shoe can help when doing a lot of walking Calf compression sleeve can help as well when on the golf course Iron 65mg  with 500mg  of vitamin C 3 times a week.  See me again in 4 weeks if not better

## 2016-08-10 DIAGNOSIS — H04123 Dry eye syndrome of bilateral lacrimal glands: Secondary | ICD-10-CM | POA: Diagnosis not present

## 2016-08-10 DIAGNOSIS — H25813 Combined forms of age-related cataract, bilateral: Secondary | ICD-10-CM | POA: Diagnosis not present

## 2016-08-10 DIAGNOSIS — H01001 Unspecified blepharitis right upper eyelid: Secondary | ICD-10-CM | POA: Diagnosis not present

## 2016-08-10 DIAGNOSIS — Z0100 Encounter for examination of eyes and vision without abnormal findings: Secondary | ICD-10-CM | POA: Diagnosis not present

## 2016-09-01 ENCOUNTER — Ambulatory Visit: Payer: PPO | Admitting: Family Medicine

## 2016-09-28 ENCOUNTER — Encounter: Payer: PPO | Admitting: Gynecology

## 2016-09-29 ENCOUNTER — Ambulatory Visit (INDEPENDENT_AMBULATORY_CARE_PROVIDER_SITE_OTHER): Payer: PPO | Admitting: Gynecology

## 2016-09-29 ENCOUNTER — Encounter: Payer: Self-pay | Admitting: Gynecology

## 2016-09-29 VITALS — BP 118/76 | Ht 65.5 in | Wt 99.0 lb

## 2016-09-29 DIAGNOSIS — N952 Postmenopausal atrophic vaginitis: Secondary | ICD-10-CM

## 2016-09-29 DIAGNOSIS — M899 Disorder of bone, unspecified: Secondary | ICD-10-CM

## 2016-09-29 DIAGNOSIS — Z01411 Encounter for gynecological examination (general) (routine) with abnormal findings: Secondary | ICD-10-CM | POA: Diagnosis not present

## 2016-09-29 NOTE — Progress Notes (Signed)
    [name] December 01, 1945 [id number]        71 y.o.  G0P0 for breast and pelvic exam.  Past medical history,surgical history, problem list, medications, allergies, family history and social history were all reviewed and documented as reviewed in the EPIC chart.  ROS:  Performed with pertinent positives and negatives included in the history, assessment and plan.   Additional significant findings :  None   Exam: Caryn Bee assistant Vitals:   09/29/16 1102  BP: 118/76  Weight: 99 lb (44.9 kg)  Height: 5' 5.5" (1.664 m)   Body mass index is 16.22 kg/m.  General appearance:  Normal affect, orientation and appearance. Skin: Grossly normal HEENT: Without gross lesions.  No cervical or supraclavicular adenopathy. Thyroid normal.  Lungs:  Clear without wheezing, rales or rhonchi Cardiac: RR, without RMG Abdominal:  Soft, nontender, without masses, guarding, rebound, organomegaly or hernia Breasts:  Examined lying and sitting without masses, retractions, discharge or axillary adenopathy. Pelvic:  Ext, BUS, Vagina with atrophic changes  Cervix with atrophic changes  Uterus anteverted, normal size, shape and contour, midline and mobile nontender   Adnexa without masses or tenderness    Anus and perineum normal   Rectovaginal normal sphincter tone without palpated masses or tenderness.    Assessment/Plan:  71 y.o. G0P0 female for breast and pelvic exam.   1. Postmenopausal/atrophic genital changes.  Doing well without significant hot flushes, night sweats, vaginal dryness or any vaginal bleeding. Continue to monitor report any issues bleeding. 2. Osteopenia. DEXA 08/2014 T score -2.0. FRAX 6.9%/0.9%. Repeat DEXA now and patient will schedule follow up for this. Increased calcium vitamin D reviewed. 3. Pap smear 07/2014. No Pap smear done today. No history of abnormal Pap smears. Options to stop screening per current screening guidelines based on age discussed. Will readdress on  annual basis. 4. Mammography 04/2016. Continue with annual mammography when due. SBE monthly reviewed. 5. Colonoscopy 2016. Repeat at their recommended interval. 6. Health maintenance. No routine lab work done as patient does this elsewhere. Follow up 1 year, sooner as needed.   Anastasio Auerbach MD, 11:20 AM 09/29/2016

## 2016-09-29 NOTE — Patient Instructions (Signed)
Followup for bone density as scheduled. 

## 2016-10-15 ENCOUNTER — Ambulatory Visit (INDEPENDENT_AMBULATORY_CARE_PROVIDER_SITE_OTHER): Payer: PPO

## 2016-10-15 DIAGNOSIS — M899 Disorder of bone, unspecified: Secondary | ICD-10-CM | POA: Diagnosis not present

## 2016-10-16 ENCOUNTER — Telehealth: Payer: Self-pay | Admitting: Gynecology

## 2016-10-16 NOTE — Telephone Encounter (Signed)
Tell patient her most recent bone density shows a stable spine but some loss at the hips bilaterally. When we calculate her theoretical risk of fracture over 10 years she has a 27% risk of fracture overall and a 17% risk of hip fracture. I think part of this increased calculated risk relates to her parental history of hip fracture. I know she has tried some medicines in the past. I would recommend office visit to discuss treatment options.

## 2016-10-16 NOTE — Telephone Encounter (Signed)
Left message for pt to call.

## 2016-10-16 NOTE — Telephone Encounter (Signed)
Pt informed with the below note, pt will call back to schedule.  

## 2016-10-23 ENCOUNTER — Encounter: Payer: Self-pay | Admitting: Gynecology

## 2016-10-23 ENCOUNTER — Ambulatory Visit (INDEPENDENT_AMBULATORY_CARE_PROVIDER_SITE_OTHER): Payer: PPO | Admitting: Gynecology

## 2016-10-23 VITALS — BP 116/74

## 2016-10-23 DIAGNOSIS — M8580 Other specified disorders of bone density and structure, unspecified site: Secondary | ICD-10-CM

## 2016-10-23 NOTE — Patient Instructions (Signed)
We will plan on repeating the bone density in 2 years.

## 2016-10-23 NOTE — Progress Notes (Signed)
    RUTH SLUSHER 1945-11-06 [id number]        71 y.o.  G0P0 presents to discuss her most recent bone density which shows a T score of -2.0 and FRAX of 27%/17%. She had a statistically significant decline of both hips and was stable at the spine. She has a maternal history of probable hip fracture although her details are not overly clear her mother did fall and was hospitalized for a week because of a hip injury but did not have surgery.  She had been placed on Fosamax previously years ago and did not tolerate from a GI standpoint. She does have a history of ulcer and gastritis. She was then placed on Evista and developed a significant skin rash which persisted for one to 2 years after discontinuing the medication.  Past medical history,surgical history, problem list, medications, allergies, family history and social history were all reviewed and documented in the EPIC chart.  Directed ROS with pertinent positives and negatives documented in the history of present illness/assessment and plan.  Exam: Vitals:   10/23/16 0945  BP: 116/74   General appearance:  Normal   Assessment/Plan:  71 y.o. G0P0 with osteopenia. Elevated FRAX.  I reviewed with the patient that the elevated FRAX is contributed by her history of maternal hip fracture. If indeed her mother did not have a hip fracture this would decrease her FRAX. Regardless the issues of treatment and benefits to reduce her risk of fracture reviewed. She did not tolerate oral Fosamax. Evista gave her a rash. I discussed alternatives to include Prolia and Reclast. Risks of osteonecrosis of the jaw, atypical fractures, rash and infections discussed. Options for referral consultation with an endocrinologist also discussed. At this time patient is not interested in treatment. She understands her increased risk of fracture. She is active, walks on a regular basis and has no issues with balance. She will call if she changes her mind but otherwise we will  plan on repeating her bone density in 2 years.  Greater than 50% of my time was spent in direct face to face counseling and coordination of care with the patient.     Anastasio Auerbach MD, 10:04 AM 10/23/2016

## 2017-02-23 IMAGING — CT CT HEAD W/O CM
2 series · 16 of 30 positions shown, 20 images · non-contrast
Comparison: None

CLINICAL DATA: Weakness, nausea, LEFT arm numbness and hot feeling,
now with headache

EXAM:
CT HEAD WITHOUT CONTRAST
TECHNIQUE: Contiguous axial images were obtained from the base of the skull
through the vertex without intravenous contrast.

[Series 2: head w/o · axial · non-contrast · 0.41mm/px · z∈[+1232,+1352]mm · 13 of 30 slices shown, 17 images]
[im 3/30  brain]
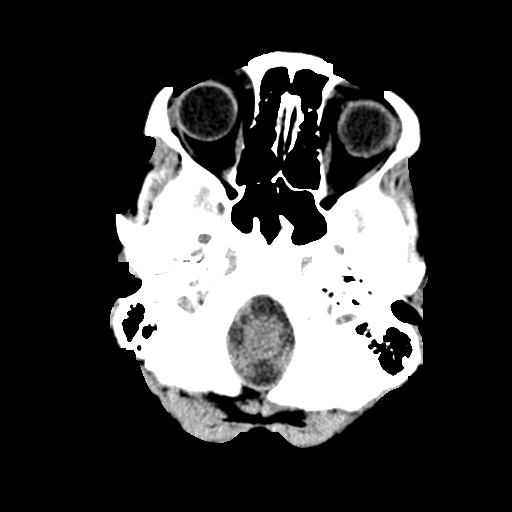
[im 3/30  bone]
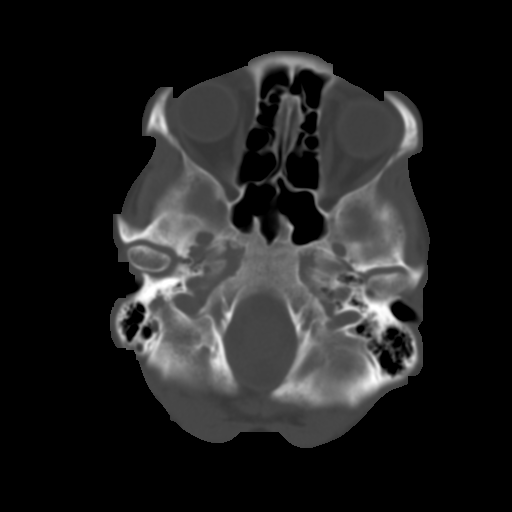
[im 5/30  brain]
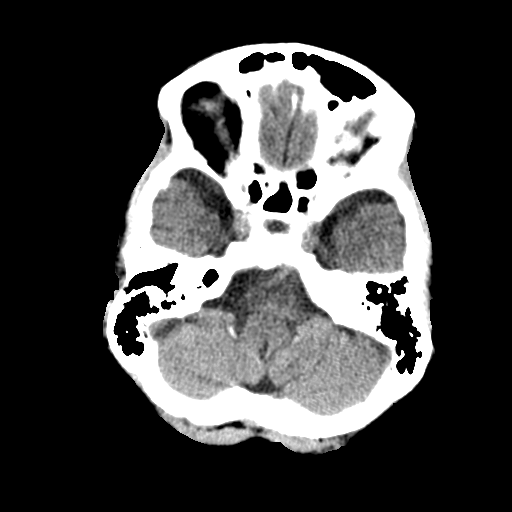
[im 7/30  brain]
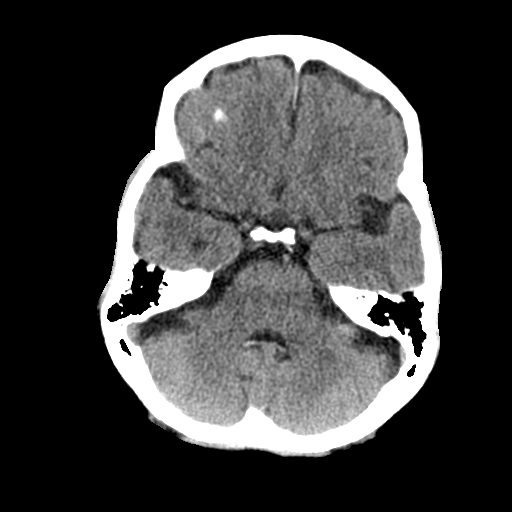
[im 9/30  brain]
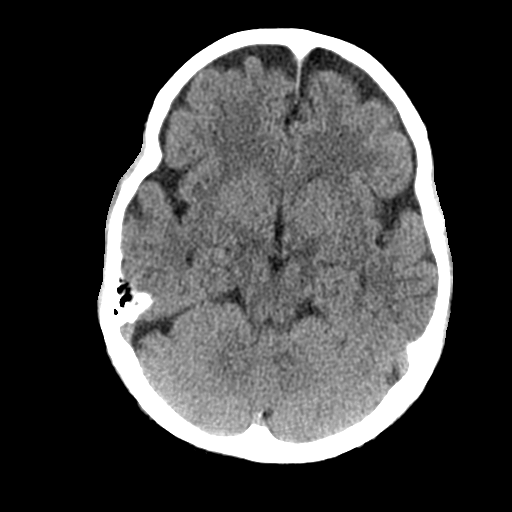
[im 11/30  brain]
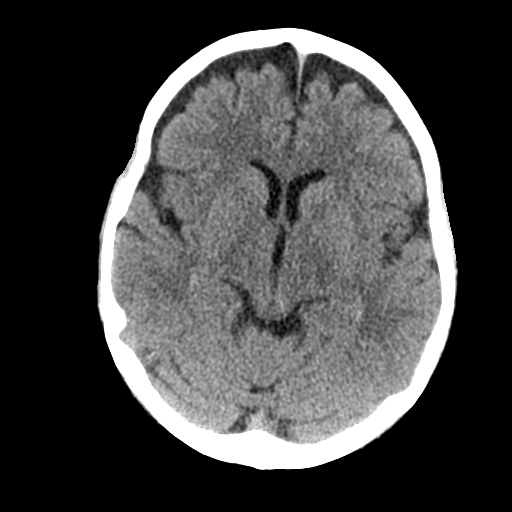
[im 11/30  bone]
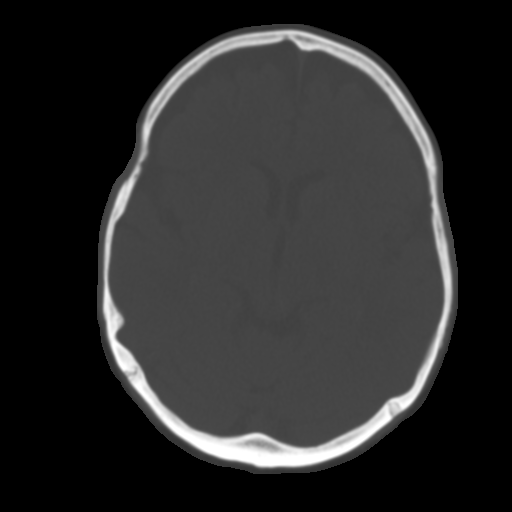
[im 13/30  brain]
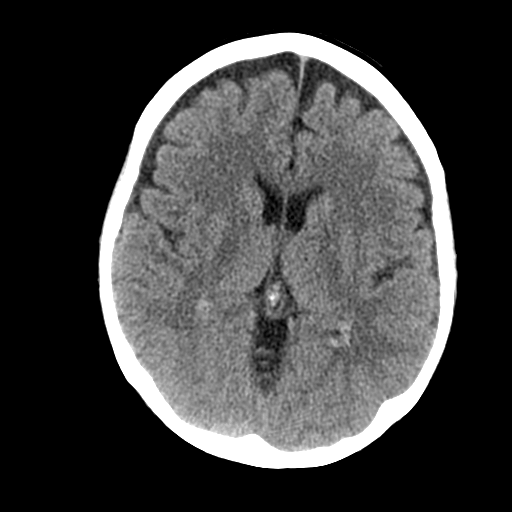
[im 15/30  brain]
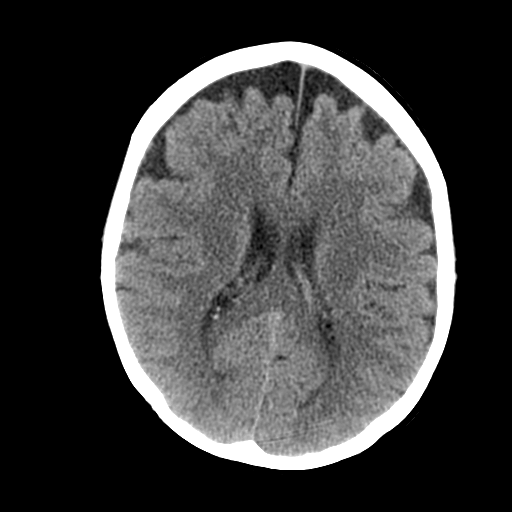
[im 17/30  brain]
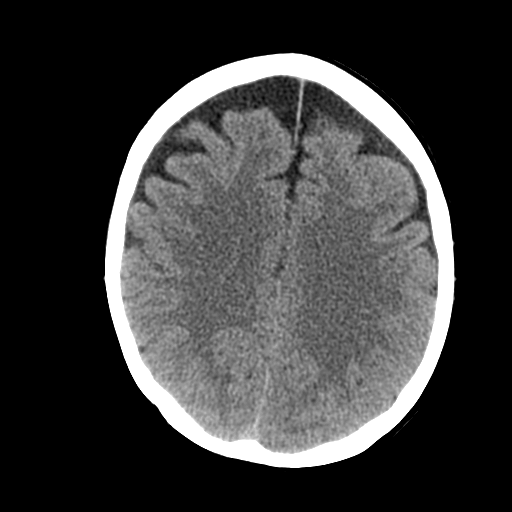
[im 19/30  brain]
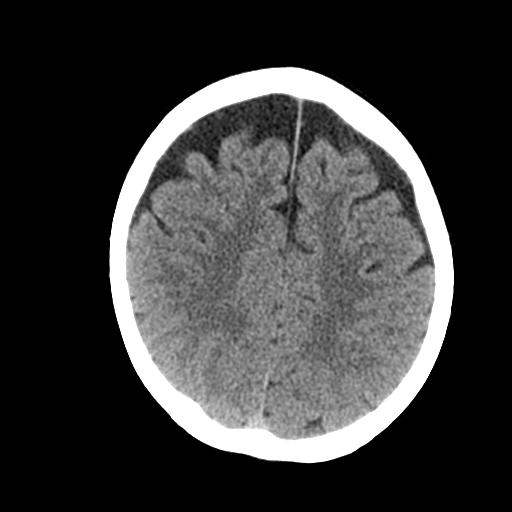
[im 19/30  bone]
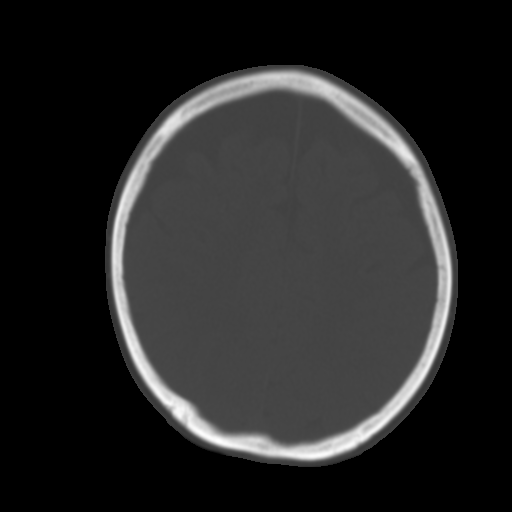
[im 21/30  brain]
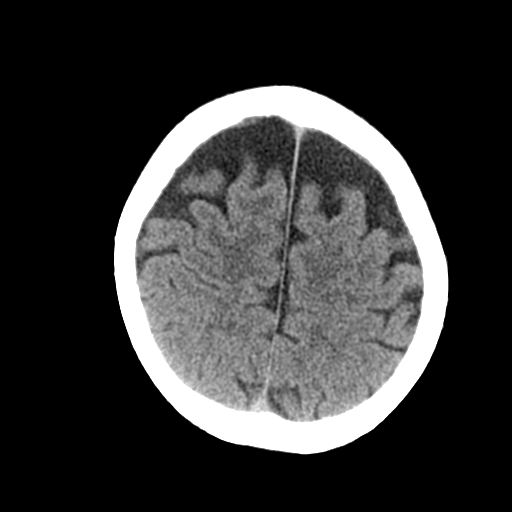
[im 23/30  brain]
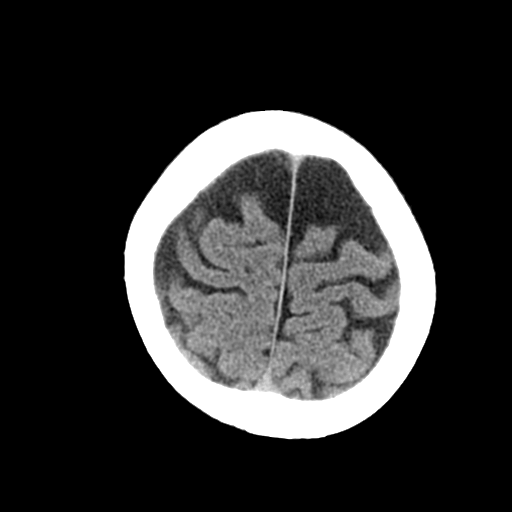
[im 25/30  brain]
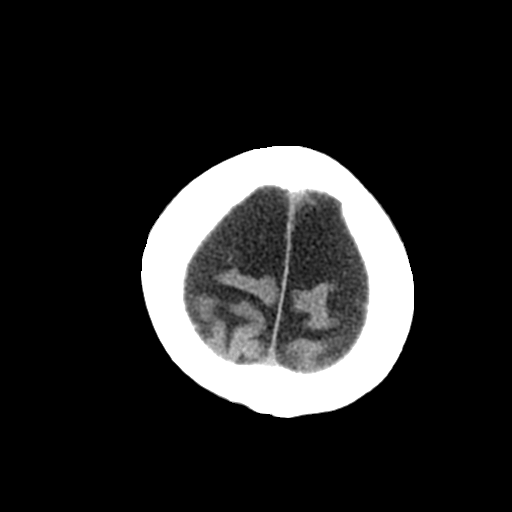
[im 27/30  brain]
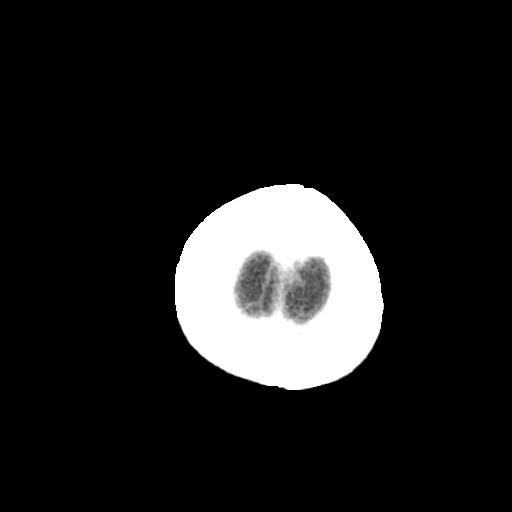
[im 27/30  bone]
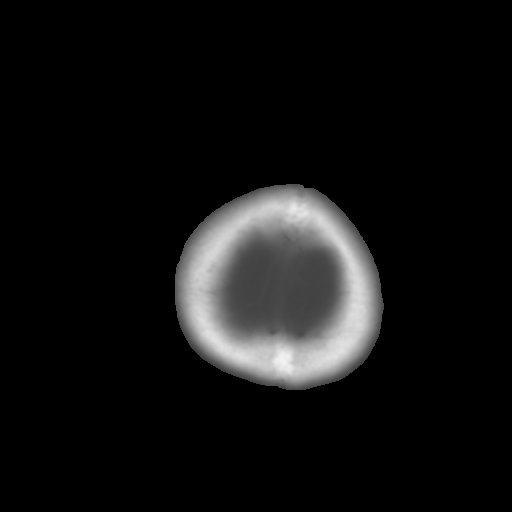

[Series 3: bone windows · axial · 0.41mm/px · z∈[+1232,+1272]mm · 3 of 30 slices shown]
[im 3/30  bone]
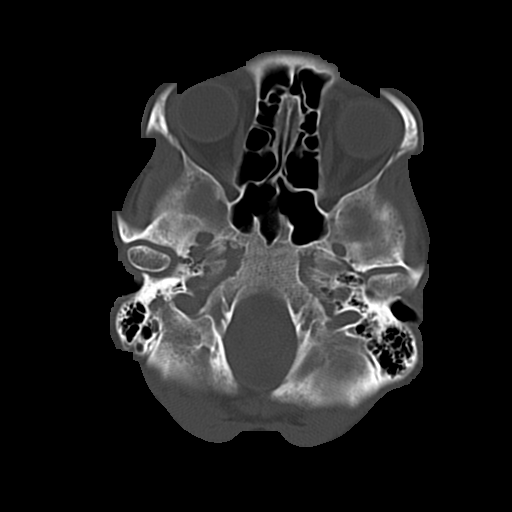
[im 7/30  bone]
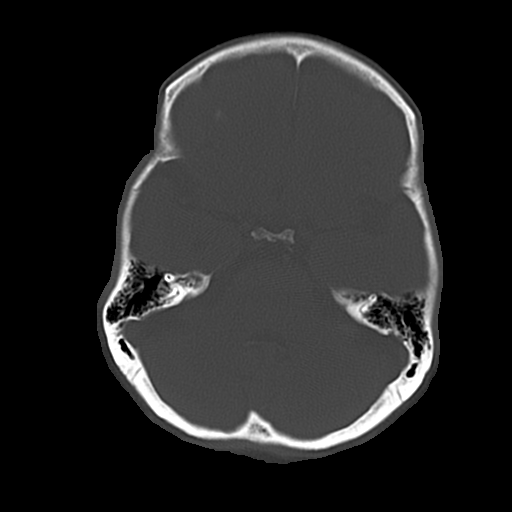
[im 11/30  bone]
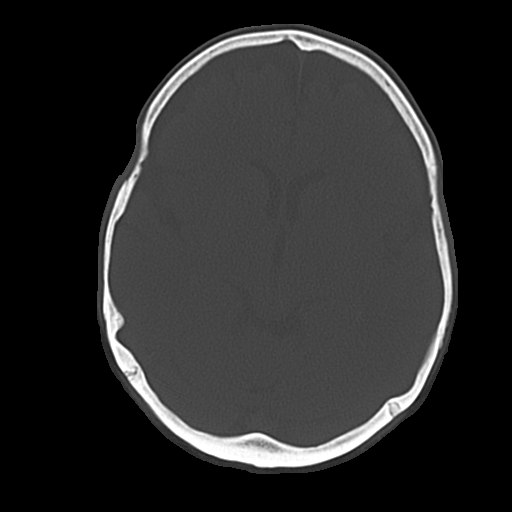

[16 of 30 positions shown; findings below may reference images not displayed]

FINDINGS: Generalized atrophy.

Normal ventricular morphology.

No midline shift or mass effect.

Normal appearance of brain parenchyma.

No intracranial hemorrhage, mass lesion, or evidence acute
infarction.

No extra-axial fluid collections.

Visualized paranasal sinuses and mastoid air cells clear.

No acute osseous findings.
IMPRESSION: Atrophy.

No acute intracranial abnormalities.

## 2017-04-08 ENCOUNTER — Other Ambulatory Visit: Payer: Self-pay | Admitting: Gynecology

## 2017-04-08 DIAGNOSIS — Z1231 Encounter for screening mammogram for malignant neoplasm of breast: Secondary | ICD-10-CM

## 2017-04-23 ENCOUNTER — Ambulatory Visit
Admission: RE | Admit: 2017-04-23 | Discharge: 2017-04-23 | Disposition: A | Discharge status: Home / Self Care | Payer: PPO | Source: Ambulatory Visit | Attending: Gynecology | Admitting: Gynecology

## 2017-04-23 DIAGNOSIS — Z1231 Encounter for screening mammogram for malignant neoplasm of breast: Secondary | ICD-10-CM

## 2017-05-27 ENCOUNTER — Other Ambulatory Visit (INDEPENDENT_AMBULATORY_CARE_PROVIDER_SITE_OTHER): Payer: PPO

## 2017-05-27 ENCOUNTER — Ambulatory Visit (INDEPENDENT_AMBULATORY_CARE_PROVIDER_SITE_OTHER)
Admission: RE | Admit: 2017-05-27 | Discharge: 2017-05-27 | Disposition: A | Discharge status: Home / Self Care | Payer: PPO | Source: Ambulatory Visit | Attending: Internal Medicine | Admitting: Internal Medicine

## 2017-05-27 ENCOUNTER — Encounter: Payer: Self-pay | Admitting: Internal Medicine

## 2017-05-27 ENCOUNTER — Ambulatory Visit (INDEPENDENT_AMBULATORY_CARE_PROVIDER_SITE_OTHER): Payer: PPO | Admitting: Internal Medicine

## 2017-05-27 VITALS — BP 112/72 | HR 70 | Temp 98.0°F | Ht 66.0 in | Wt 97.1 lb

## 2017-05-27 DIAGNOSIS — R197 Diarrhea, unspecified: Secondary | ICD-10-CM

## 2017-05-27 DIAGNOSIS — R05 Cough: Secondary | ICD-10-CM

## 2017-05-27 DIAGNOSIS — R059 Cough, unspecified: Secondary | ICD-10-CM

## 2017-05-27 DIAGNOSIS — E785 Hyperlipidemia, unspecified: Secondary | ICD-10-CM

## 2017-05-27 DIAGNOSIS — I498 Other specified cardiac arrhythmias: Secondary | ICD-10-CM | POA: Insufficient documentation

## 2017-05-27 DIAGNOSIS — Z0000 Encounter for general adult medical examination without abnormal findings: Secondary | ICD-10-CM

## 2017-05-27 DIAGNOSIS — R590 Localized enlarged lymph nodes: Secondary | ICD-10-CM

## 2017-05-27 DIAGNOSIS — Z0001 Encounter for general adult medical examination with abnormal findings: Secondary | ICD-10-CM

## 2017-05-27 LAB — BASIC METABOLIC PANEL
BUN: 11 mg/dL (ref 6–23)
CO2: 26 mEq/L (ref 19–32)
Calcium: 10.1 mg/dL (ref 8.4–10.5)
Chloride: 103 mEq/L (ref 96–112)
Creatinine, Ser: 0.65 mg/dL (ref 0.40–1.20)
GFR: 95.33 mL/min (ref >60.00)
GLUCOSE: 96 mg/dL (ref 70–99)
POTASSIUM: 4.2 meq/L (ref 3.5–5.1)
SODIUM: 140 meq/L (ref 135–145)

## 2017-05-27 LAB — LIPID PANEL
Cholesterol: 230 mg/dL — ABNORMAL HIGH (ref 0–200)
HDL: 96.6 mg/dL (ref >39.00)
LDL CALC: 121 mg/dL — AB (ref 0–99)
NONHDL: 133.32
Total CHOL/HDL Ratio: 2
Triglycerides: 62 mg/dL (ref 0.0–149.0)
VLDL: 12.4 mg/dL (ref 0.0–40.0)

## 2017-05-27 LAB — HEPATIC FUNCTION PANEL
ALK PHOS: 76 U/L (ref 39–117)
ALT: 23 U/L (ref 0–35)
AST: 46 U/L — AB (ref 0–37)
Albumin: 4.8 g/dL (ref 3.5–5.2)
BILIRUBIN TOTAL: 0.9 mg/dL (ref 0.2–1.2)
Bilirubin, Direct: 0.2 mg/dL (ref 0.0–0.3)
Total Protein: 7.4 g/dL (ref 6.0–8.3)

## 2017-05-27 LAB — CBC WITH DIFFERENTIAL/PLATELET
Basophils Absolute: 0 10*3/uL (ref 0.0–0.1)
Basophils Relative: 0.7 % (ref 0.0–3.0)
EOS PCT: 1.5 % (ref 0.0–5.0)
Eosinophils Absolute: 0.1 10*3/uL (ref 0.0–0.7)
HCT: 45 % (ref 36.0–46.0)
HEMOGLOBIN: 14.8 g/dL (ref 12.0–15.0)
LYMPHS ABS: 1.8 10*3/uL (ref 0.7–4.0)
Lymphocytes Relative: 27.7 % (ref 12.0–46.0)
MCHC: 32.8 g/dL (ref 30.0–36.0)
MCV: 96.5 fl (ref 78.0–100.0)
MONOS PCT: 9.7 % (ref 3.0–12.0)
Monocytes Absolute: 0.6 10*3/uL (ref 0.1–1.0)
Neutro Abs: 4 10*3/uL (ref 1.4–7.7)
Neutrophils Relative %: 60.4 % (ref 43.0–77.0)
Platelets: 289 10*3/uL (ref 150.0–400.0)
RBC: 4.67 Mil/uL (ref 3.87–5.11)
RDW: 12.7 % (ref 11.5–15.5)
WBC: 6.6 10*3/uL (ref 4.0–10.5)

## 2017-05-27 LAB — SEDIMENTATION RATE: Sed Rate: 2 mm/hr (ref 0–30)

## 2017-05-27 LAB — URINALYSIS, ROUTINE W REFLEX MICROSCOPIC
BILIRUBIN URINE: NEGATIVE
KETONES UR: NEGATIVE
LEUKOCYTES UA: NEGATIVE
Nitrite: NEGATIVE
PH: 5.5 (ref 5.0–8.0)
SPECIFIC GRAVITY, URINE: 1.02 (ref 1.000–1.030)
Total Protein, Urine: NEGATIVE
Urine Glucose: NEGATIVE
Urobilinogen, UA: 0.2 (ref 0.0–1.0)

## 2017-05-27 LAB — TSH: TSH: 2.08 u[IU]/mL (ref 0.35–4.50)

## 2017-05-27 MED ORDER — PANCRELIPASE (LIP-PROT-AMYL) 36000-114000 UNITS PO CPEP: 36000.0000 [IU] | ORAL_CAPSULE | Freq: Three times a day (TID) | ORAL | 5 refills | Status: DC

## 2017-05-27 NOTE — Assessment & Plan Note (Signed)
Gluten free trial (no wheat products) for 4-6 weeks. OK to use gluten-free bread and gluten-free pasta.  Milk free trial (no milk, ice cream, cheese and yogurt) for 4-6 weeks. OK to use almond, coconut, rice or soy milk. "Almond breeze" brand tastes good.  

## 2017-05-27 NOTE — Assessment & Plan Note (Signed)

## 2017-05-27 NOTE — Assessment & Plan Note (Signed)
Labs

## 2017-05-27 NOTE — Assessment & Plan Note (Signed)
CXR

## 2017-05-27 NOTE — Patient Instructions (Addendum)
Gluten free trial (no wheat products) for 4-6 weeks. OK to use gluten-free bread and gluten-free pasta.  Milk free trial (no milk, ice cream, cheese and yogurt) for 4-6 weeks. OK to use almond, coconut, rice or soy milk. "Almond breeze" brand tastes good.  Try Creon  Health Maintenance for Postmenopausal Women Menopause is a normal process in which your reproductive ability comes to an end. This process happens gradually over a span of months to years, usually between the ages of 68 and 69. Menopause is complete when you have missed 12 consecutive menstrual periods. It is important to talk with your health care provider about some of the most common conditions that affect postmenopausal women, such as heart disease, cancer, and bone loss (osteoporosis). Adopting a healthy lifestyle and getting preventive care can help to promote your health and wellness. Those actions can also lower your chances of developing some of these common conditions. What should I know about menopause? During menopause, you may experience a number of symptoms, such as:  Moderate-to-severe hot flashes.  Night sweats.  Decrease in sex drive.  Mood swings.  Headaches.  Tiredness.  Irritability.  Memory problems.  Insomnia.  Choosing to treat or not to treat menopausal changes is an individual decision that you make with your health care provider. What should I know about hormone replacement therapy and supplements? Hormone therapy products are effective for treating symptoms that are associated with menopause, such as hot flashes and night sweats. Hormone replacement carries certain risks, especially as you become older. If you are thinking about using estrogen or estrogen with progestin treatments, discuss the benefits and risks with your health care provider. What should I know about heart disease and stroke? Heart disease, heart attack, and stroke become more likely as you age. This may be due, in part, to  the hormonal changes that your body experiences during menopause. These can affect how your body processes dietary fats, triglycerides, and cholesterol. Heart attack and stroke are both medical emergencies. There are many things that you can do to help prevent heart disease and stroke:  Have your blood pressure checked at least every 1-2 years. High blood pressure causes heart disease and increases the risk of stroke.  If you are 21-60 years old, ask your health care provider if you should take aspirin to prevent a heart attack or a stroke.  Do not use any tobacco products, including cigarettes, chewing tobacco, or electronic cigarettes. If you need help quitting, ask your health care provider.  It is important to eat a healthy diet and maintain a healthy weight. ? Be sure to include plenty of vegetables, fruits, low-fat dairy products, and lean protein. ? Avoid eating foods that are high in solid fats, added sugars, or salt (sodium).  Get regular exercise. This is one of the most important things that you can do for your health. ? Try to exercise for at least 150 minutes each week. The type of exercise that you do should increase your heart rate and make you sweat. This is known as moderate-intensity exercise. ? Try to do strengthening exercises at least twice each week. Do these in addition to the moderate-intensity exercise.  Know your numbers.Ask your health care provider to check your cholesterol and your blood glucose. Continue to have your blood tested as directed by your health care provider.  What should I know about cancer screening? There are several types of cancer. Take the following steps to reduce your risk and to catch any  cancer development as early as possible. Breast Cancer  Practice breast self-awareness. ? This means understanding how your breasts normally appear and feel. ? It also means doing regular breast self-exams. Let your health care provider know about any  changes, no matter how small.  If you are 85 or older, have a clinician do a breast exam (clinical breast exam or CBE) every year. Depending on your age, family history, and medical history, it may be recommended that you also have a yearly breast X-ray (mammogram).  If you have a family history of breast cancer, talk with your health care provider about genetic screening.  If you are at high risk for breast cancer, talk with your health care provider about having an MRI and a mammogram every year.  Breast cancer (BRCA) gene test is recommended for women who have family members with BRCA-related cancers. Results of the assessment will determine the need for genetic counseling and BRCA1 and for BRCA2 testing. BRCA-related cancers include these types: ? Breast. This occurs in males or females. ? Ovarian. ? Tubal. This may also be called fallopian tube cancer. ? Cancer of the abdominal or pelvic lining (peritoneal cancer). ? Prostate. ? Pancreatic.  Cervical, Uterine, and Ovarian Cancer Your health care provider may recommend that you be screened regularly for cancer of the pelvic organs. These include your ovaries, uterus, and vagina. This screening involves a pelvic exam, which includes checking for microscopic changes to the surface of your cervix (Pap test).  For women ages 21-65, health care providers may recommend a pelvic exam and a Pap test every three years. For women ages 49-65, they may recommend the Pap test and pelvic exam, combined with testing for human papilloma virus (HPV), every five years. Some types of HPV increase your risk of cervical cancer. Testing for HPV may also be done on women of any age who have unclear Pap test results.  Other health care providers may not recommend any screening for nonpregnant women who are considered low risk for pelvic cancer and have no symptoms. Ask your health care provider if a screening pelvic exam is right for you.  If you have had past  treatment for cervical cancer or a condition that could lead to cancer, you need Pap tests and screening for cancer for at least 20 years after your treatment. If Pap tests have been discontinued for you, your risk factors (such as having a new sexual partner) need to be reassessed to determine if you should start having screenings again. Some women have medical problems that increase the chance of getting cervical cancer. In these cases, your health care provider may recommend that you have screening and Pap tests more often.  If you have a family history of uterine cancer or ovarian cancer, talk with your health care provider about genetic screening.  If you have vaginal bleeding after reaching menopause, tell your health care provider.  There are currently no reliable tests available to screen for ovarian cancer.  Lung Cancer Lung cancer screening is recommended for adults 68-47 years old who are at high risk for lung cancer because of a history of smoking. A yearly low-dose CT scan of the lungs is recommended if you:  Currently smoke.  Have a history of at least 30 pack-years of smoking and you currently smoke or have quit within the past 15 years. A pack-year is smoking an average of one pack of cigarettes per day for one year.  Yearly screening should:  Continue until  it has been 15 years since you quit.  Stop if you develop a health problem that would prevent you from having lung cancer treatment.  Colorectal Cancer  This type of cancer can be detected and can often be prevented.  Routine colorectal cancer screening usually begins at age 1 and continues through age 36.  If you have risk factors for colon cancer, your health care provider may recommend that you be screened at an earlier age.  If you have a family history of colorectal cancer, talk with your health care provider about genetic screening.  Your health care provider may also recommend using home test kits to check  for hidden blood in your stool.  A small camera at the end of a tube can be used to examine your colon directly (sigmoidoscopy or colonoscopy). This is done to check for the earliest forms of colorectal cancer.  Direct examination of the colon should be repeated every 5-10 years until age 68. However, if early forms of precancerous polyps or small growths are found or if you have a family history or genetic risk for colorectal cancer, you may need to be screened more often.  Skin Cancer  Check your skin from head to toe regularly.  Monitor any moles. Be sure to tell your health care provider: ? About any new moles or changes in moles, especially if there is a change in a mole's shape or color. ? If you have a mole that is larger than the size of a pencil eraser.  If any of your family members has a history of skin cancer, especially at a young age, talk with your health care provider about genetic screening.  Always use sunscreen. Apply sunscreen liberally and repeatedly throughout the day.  Whenever you are outside, protect yourself by wearing long sleeves, pants, a wide-brimmed hat, and sunglasses.  What should I know about osteoporosis? Osteoporosis is a condition in which bone destruction happens more quickly than new bone creation. After menopause, you may be at an increased risk for osteoporosis. To help prevent osteoporosis or the bone fractures that can happen because of osteoporosis, the following is recommended:  If you are 33-93 years old, get at least 1,000 mg of calcium and at least 600 mg of vitamin D per day.  If you are older than age 68 but younger than age 80, get at least 1,200 mg of calcium and at least 600 mg of vitamin D per day.  If you are older than age 12, get at least 1,200 mg of calcium and at least 800 mg of vitamin D per day.  Smoking and excessive alcohol intake increase the risk of osteoporosis. Eat foods that are rich in calcium and vitamin D, and do  weight-bearing exercises several times each week as directed by your health care provider. What should I know about how menopause affects my mental health? Depression may occur at any age, but it is more common as you become older. Common symptoms of depression include:  Low or sad mood.  Changes in sleep patterns.  Changes in appetite or eating patterns.  Feeling an overall lack of motivation or enjoyment of activities that you previously enjoyed.  Frequent crying spells.  Talk with your health care provider if you think that you are experiencing depression. What should I know about immunizations? It is important that you get and maintain your immunizations. These include:  Tetanus, diphtheria, and pertussis (Tdap) booster vaccine.  Influenza every year before the flu season  begins.  Pneumonia vaccine.  Shingles vaccine.  Your health care provider may also recommend other immunizations. This information is not intended to replace advice given to you by your health care provider. Make sure you discuss any questions you have with your health care provider. Document Released: 10/30/2005 Document Revised: 03/27/2016 Document Reviewed: 06/11/2015 Elsevier Interactive Patient Education  2018 [employer].

## 2017-05-27 NOTE — Assessment & Plan Note (Signed)
9/18 subjective chest fluttering in the chest when going upstairs and has a short HA x months

## 2017-05-27 NOTE — Progress Notes (Signed)
Subjective:  Patient ID: [name], female    DOB: 1945/12/13  Age: 71 y.o. MRN: [id number]  CC: Annual Exam   HPI [name] LAROCHE presents for a well exam C/o craving more water - diarrhea x long time (2 years) - somewhat worse, not every day C/o chest fluttering in the chest when going upstairs and has a short HA x months  Outpatient Medications Prior to Visit  Medication Sig Dispense Refill  . Biotin 800 MCG TABS Take 1 tablet by mouth daily.    . cholecalciferol (VITAMIN D) 1000 UNITS tablet Take 2,000 Units by mouth daily.     . Omega-3 Fatty Acids (FISH OIL) 1000 MG CAPS Take 1 capsule by mouth daily.      . vitamin C (ASCORBIC ACID) 500 MG tablet Take 500 mg by mouth daily.      . Multiple Vitamin (MULTIVITAMIN) capsule Take 1 capsule by mouth once a week.      Facility-Administered Medications Prior to Visit  Medication Dose Route Frequency Provider Last Rate Last Dose  . methylPREDNISolone acetate (DEPO-MEDROL) injection 80 mg  80 mg Intramuscular Once Hulan Saas M, DO        ROS Review of Systems  Constitutional: Negative for activity change, appetite change, chills, fatigue and unexpected weight change.  HENT: Negative for congestion, mouth sores and sinus pressure.   Eyes: Negative for visual disturbance.  Respiratory: Negative for cough and chest tightness.   Gastrointestinal: Negative for abdominal pain and nausea.  Genitourinary: Negative for difficulty urinating, frequency and vaginal pain.  Musculoskeletal: Negative for back pain and gait problem.  Skin: Negative for pallor and rash.  Neurological: Negative for dizziness, tremors, weakness, numbness and headaches.  Psychiatric/Behavioral: Negative for confusion and sleep disturbance.    Objective:  BP 112/72   Pulse 70   Temp 98 F (36.7 C) (Oral)   Ht 5\' 6"  (1.676 m)   Wt 97 lb 1.9 oz (44.1 kg)   SpO2 99%   BMI 15.68 kg/m   BP Readings from Last 3 Encounters:  05/27/17 112/72  10/23/16  116/74  09/29/16 118/76    Wt Readings from Last 3 Encounters:  05/27/17 97 lb 1.9 oz (44.1 kg)  09/29/16 99 lb (44.9 kg)  08/04/16 101 lb (45.8 kg)    Physical Exam  Constitutional: She appears well-developed. No distress.  HENT:  Head: Normocephalic.  Right Ear: External ear normal.  Left Ear: External ear normal.  Nose: Nose normal.  Mouth/Throat: Oropharynx is clear and moist.  Eyes: Pupils are equal, round, and reactive to light. Conjunctivae are normal. Right eye exhibits no discharge. Left eye exhibits no discharge.  Neck: Normal range of motion. Neck supple. No JVD present. No tracheal deviation present. No thyromegaly present.  Cardiovascular: Normal rate, regular rhythm and normal heart sounds.   Pulmonary/Chest: No stridor. No respiratory distress. She has no wheezes.  Abdominal: Soft. Bowel sounds are normal. She exhibits no distension and no mass. There is no tenderness. There is no rebound and no guarding.  Musculoskeletal: She exhibits no edema or tenderness.  Lymphadenopathy:    She has no cervical adenopathy.  Neurological: She displays normal reflexes. No cranial nerve deficit. She exhibits normal muscle tone. Coordination normal.  Skin: No rash noted. No erythema.  Psychiatric: She has a normal mood and affect. Her behavior is normal. Judgment and thought content normal.  anterior cerv adenopathy  Procedure: EKG Indication: chest fluttering Impression: NSR. No acute/new changes.   Lab  Results  Component Value Date   WBC 6.4 12/02/2015   HGB 14.4 12/02/2015   HCT 42.9 12/02/2015   PLT 281.0 12/02/2015   GLUCOSE 94 04/27/2016   CHOL 203 (H) 04/27/2016   TRIG 57.0 04/27/2016   HDL 91.10 04/27/2016   LDLDIRECT 107.6 08/15/2012   LDLCALC 101 (H) 04/27/2016   ALT 17 12/02/2015   AST 36 12/02/2015   NA 140 04/27/2016   K 4.4 04/27/2016   CL 103 04/27/2016   CREATININE 0.66 04/27/2016   BUN 13 04/27/2016   CO2 29 04/27/2016   TSH 3.71 12/02/2015     Mm Screening Breast Tomo Bilateral  Result Date: 04/26/2017 CLINICAL DATA:  Screening. EXAM: 2D DIGITAL SCREENING BILATERAL MAMMOGRAM WITH CAD AND ADJUNCT TOMO COMPARISON:  Previous exam(s). ACR Breast Density Category c: The breast tissue is heterogeneously dense, which may obscure small masses. FINDINGS: There are no findings suspicious for malignancy. Images were processed with CAD. IMPRESSION: No mammographic evidence of malignancy. A result letter of this screening mammogram will be mailed directly to the patient. RECOMMENDATION: Screening mammogram in one year. (Code:SM-B-01Y) BI-RADS CATEGORY  1: Negative. Electronically Signed   By: Lovey Newcomer M.D.   On: 04/26/2017 08:29    Assessment & Plan:   There are no diagnoses linked to this encounter. I have discontinued Ms. Nova's multivitamin. I am also having her maintain her cholecalciferol, Fish Oil, vitamin C, and Biotin. We will continue to administer methylPREDNISolone acetate.  No orders of the defined types were placed in this encounter.    Follow-up: No Follow-up on file.  Walker Kehr, MD

## 2017-06-29 ENCOUNTER — Ambulatory Visit (INDEPENDENT_AMBULATORY_CARE_PROVIDER_SITE_OTHER): Payer: PPO

## 2017-06-29 DIAGNOSIS — Z23 Encounter for immunization: Secondary | ICD-10-CM

## 2017-08-09 DIAGNOSIS — H0100B Unspecified blepharitis left eye, upper and lower eyelids: Secondary | ICD-10-CM | POA: Diagnosis not present

## 2017-08-09 DIAGNOSIS — H0100A Unspecified blepharitis right eye, upper and lower eyelids: Secondary | ICD-10-CM | POA: Diagnosis not present

## 2017-08-09 DIAGNOSIS — H524 Presbyopia: Secondary | ICD-10-CM | POA: Diagnosis not present

## 2017-08-09 DIAGNOSIS — H04123 Dry eye syndrome of bilateral lacrimal glands: Secondary | ICD-10-CM | POA: Diagnosis not present

## 2017-09-30 ENCOUNTER — Encounter: Payer: Self-pay | Admitting: Gynecology

## 2017-09-30 ENCOUNTER — Ambulatory Visit: Payer: PPO | Admitting: Gynecology

## 2017-09-30 VITALS — BP 116/76 | Ht 65.5 in | Wt 98.0 lb

## 2017-09-30 DIAGNOSIS — M8580 Other specified disorders of bone density and structure, unspecified site: Secondary | ICD-10-CM

## 2017-09-30 DIAGNOSIS — N952 Postmenopausal atrophic vaginitis: Secondary | ICD-10-CM

## 2017-09-30 DIAGNOSIS — Z01411 Encounter for gynecological examination (general) (routine) with abnormal findings: Secondary | ICD-10-CM

## 2017-09-30 NOTE — Patient Instructions (Signed)
Follow-up in 1 year for annual exam, sooner as needed. 

## 2017-09-30 NOTE — Progress Notes (Signed)
    [name] 09-11-1946 [id number]        72 y.o.  G0P0 for breast and pelvic exam  Past medical history,surgical history, problem list, medications, allergies, family history and social history were all reviewed and documented as reviewed in the EPIC chart.  ROS:  Performed with pertinent positives and negatives included in the history, assessment and plan.   Additional significant findings : None   Exam: Caryn Bee assistant Vitals:   09/30/17 1129  BP: 116/76  Weight: 98 lb (44.5 kg)  Height: 5' 5.5" (1.664 m)   Body mass index is 16.06 kg/m.  General appearance:  Normal affect, orientation and appearance. Skin: Grossly normal HEENT: Without gross lesions.  No cervical or supraclavicular adenopathy. Thyroid normal.  Lungs:  Clear without wheezing, rales or rhonchi Cardiac: RR, without RMG Abdominal:  Soft, nontender, without masses, guarding, rebound, organomegaly or hernia Breasts:  Examined lying and sitting without masses, retractions, discharge or axillary adenopathy. Pelvic:  Ext, BUS, Vagina: With atrophic changes  Cervix: With atrophic changes  Uterus: Anteverted, normal size, shape and contour, midline and mobile nontender   Adnexa: Without masses or tenderness    Anus and perineum: Normal   Rectovaginal: Normal sphincter tone without palpated masses or tenderness.    Assessment/Plan:  72 y.o. G0P0 female for breast and pelvic exam.   1. Postmenopausal/atrophic genital changes.  No significant hot flushes, night sweats, vaginal dryness or any bleeding.  Continue to monitor and report any issues or bleeding. 2. Osteopenia.  DEXA 09/2016 T score -2.0 FRAX 27% / 17%.  As per our 10/23/2016 consult patient declines treatment based on increased FRAX.  Is active and preferred observation with follow-up bone density next year. 3. Pap smear 2015.  No Pap smear done today.  No history of abnormal Pap smears.  Reviewed current screening guidelines and we both feel  comfortable stop screening per current screening guidelines based on age. 4. Colonoscopy 2016.  Repeat at their recommended interval. 5. Mammography 04/2017.  Continue with annual mammography when due.  SBE monthly reviewed. 6. Health maintenance.  No routine lab work done as patient does this elsewhere.  Follow-up 1 year, sooner as needed.   Anastasio Auerbach MD, 11:49 AM 09/30/2017

## 2017-10-22 ENCOUNTER — Encounter: Payer: Self-pay | Admitting: Internal Medicine

## 2018-03-11 ENCOUNTER — Other Ambulatory Visit: Payer: Self-pay | Admitting: Gynecology

## 2018-03-11 DIAGNOSIS — Z1231 Encounter for screening mammogram for malignant neoplasm of breast: Secondary | ICD-10-CM

## 2018-04-26 ENCOUNTER — Ambulatory Visit
Admission: RE | Admit: 2018-04-26 | Discharge: 2018-04-26 | Disposition: A | Discharge status: Home / Self Care | Payer: PPO | Source: Ambulatory Visit | Attending: Gynecology | Admitting: Gynecology

## 2018-04-26 DIAGNOSIS — Z1231 Encounter for screening mammogram for malignant neoplasm of breast: Secondary | ICD-10-CM | POA: Diagnosis not present

## 2018-06-01 ENCOUNTER — Ambulatory Visit (INDEPENDENT_AMBULATORY_CARE_PROVIDER_SITE_OTHER): Payer: PPO | Admitting: Internal Medicine

## 2018-06-01 ENCOUNTER — Encounter: Payer: Self-pay | Admitting: Internal Medicine

## 2018-06-01 VITALS — BP 110/64 | HR 57 | Temp 98.2°F | Ht 65.5 in | Wt 95.0 lb

## 2018-06-01 DIAGNOSIS — Z0000 Encounter for general adult medical examination without abnormal findings: Secondary | ICD-10-CM

## 2018-06-01 DIAGNOSIS — R197 Diarrhea, unspecified: Secondary | ICD-10-CM | POA: Diagnosis not present

## 2018-06-01 DIAGNOSIS — D485 Neoplasm of uncertain behavior of skin: Secondary | ICD-10-CM | POA: Insufficient documentation

## 2018-06-01 DIAGNOSIS — E785 Hyperlipidemia, unspecified: Secondary | ICD-10-CM

## 2018-06-01 MED ORDER — SACCHAROMYCES BOULARDII 250 MG PO CAPS
250.0000 mg | ORAL_CAPSULE | Freq: Two times a day (BID) | ORAL | 1 refills | Status: DC
Start: 2018-06-01 — End: 2018-08-25

## 2018-06-01 MED ORDER — ZOSTER VAC RECOMB ADJUVANTED 50 MCG/0.5ML IM SUSR: 0.5000 mL | Freq: Once | INTRAMUSCULAR | 1 refills | Status: AC

## 2018-06-01 NOTE — Assessment & Plan Note (Addendum)
Discussed Florastor Declines tests/meds Below did not help: Gluten free trial (no wheat products) for 4-6 weeks.  Milk free trial

## 2018-06-01 NOTE — Progress Notes (Signed)
Subjective:  Patient ID: [name], female    DOB: 1945-09-24  Age: 72 y.o. MRN: [id number]  CC: No chief complaint on file.   HPI DALINA SAMARA presents for a [facility] well C/o stress - husband with bladder ca, MI in 2019 C/o loose stools x3 years  Outpatient Medications Prior to Visit  Medication Sig Dispense Refill  . cholecalciferol (VITAMIN D) 1000 UNITS tablet Take 2,000 Units by mouth daily.     . Omega-3 Fatty Acids (FISH OIL) 1000 MG CAPS Take 1 capsule by mouth daily.      . vitamin C (ASCORBIC ACID) 500 MG tablet Take 500 mg by mouth daily.      . Biotin 800 MCG TABS Take 1 tablet by mouth daily.     Facility-Administered Medications Prior to Visit  Medication Dose Route Frequency Provider Last Rate Last Dose  . methylPREDNISolone acetate (DEPO-MEDROL) injection 80 mg  80 mg Intramuscular Once Hulan Saas M, DO        ROS: Review of Systems  Constitutional: Negative for activity change, appetite change, chills, fatigue and unexpected weight change.  HENT: Negative for congestion, mouth sores and sinus pressure.   Eyes: Negative for visual disturbance.  Respiratory: Negative for cough and chest tightness.   Gastrointestinal: Positive for diarrhea. Negative for abdominal pain and nausea.  Genitourinary: Negative for difficulty urinating, frequency and vaginal pain.  Musculoskeletal: Negative for back pain and gait problem.  Skin: Negative for pallor and rash.  Neurological: Negative for dizziness, tremors, weakness, numbness and headaches.  Psychiatric/Behavioral: Negative for confusion, sleep disturbance and suicidal ideas.    Objective:  BP 110/64 (BP Location: Left Arm, Patient Position: Sitting, Cuff Size: Normal)   Pulse (!) 57   Temp 98.2 F (36.8 C) (Oral)   Ht 5' 5.5" (1.664 m)   Wt 95 lb (43.1 kg)   SpO2 99%   BMI 15.57 kg/m   BP Readings from Last 3 Encounters:  06/01/18 110/64  09/30/17 116/76  05/27/17 112/72    Wt Readings from Last 3  Encounters:  06/01/18 95 lb (43.1 kg)  09/30/17 98 lb (44.5 kg)  05/27/17 97 lb 1.9 oz (44.1 kg)    Physical Exam  Constitutional: She appears well-developed. No distress.  HENT:  Head: Normocephalic.  Right Ear: External ear normal.  Left Ear: External ear normal.  Nose: Nose normal.  Mouth/Throat: Oropharynx is clear and moist.  Eyes: Pupils are equal, round, and reactive to light. Conjunctivae are normal. Right eye exhibits no discharge. Left eye exhibits no discharge.  Neck: Normal range of motion. Neck supple. No JVD present. No tracheal deviation present. No thyromegaly present.  Cardiovascular: Normal rate, regular rhythm and normal heart sounds.  Pulmonary/Chest: No stridor. No respiratory distress. She has no wheezes.  Abdominal: Soft. Bowel sounds are normal. She exhibits no distension and no mass. There is no tenderness. There is no rebound and no guarding.  Musculoskeletal: She exhibits no edema or tenderness.  Lymphadenopathy:    She has no cervical adenopathy.  Neurological: She displays normal reflexes. No cranial nerve deficit. She exhibits normal muscle tone. Coordination normal.  Skin: No rash noted. No erythema.  Psychiatric: She has a normal mood and affect. Her behavior is normal. Judgment and thought content normal.  SKs and other Thin  Lab Results  Component Value Date   WBC 6.6 05/27/2017   HGB 14.8 05/27/2017   HCT 45.0 05/27/2017   PLT 289.0 05/27/2017   GLUCOSE 96 05/27/2017  CHOL 230 (H) 05/27/2017   TRIG 62.0 05/27/2017   HDL 96.60 05/27/2017   LDLDIRECT 107.6 08/15/2012   LDLCALC 121 (H) 05/27/2017   ALT 23 05/27/2017   AST 46 (H) 05/27/2017   NA 140 05/27/2017   K 4.2 05/27/2017   CL 103 05/27/2017   CREATININE 0.65 05/27/2017   BUN 11 05/27/2017   CO2 26 05/27/2017   TSH 2.08 05/27/2017    Mm 3d Screen Breast Bilateral  Result Date: 04/26/2018 CLINICAL DATA:  Screening. EXAM: DIGITAL SCREENING BILATERAL MAMMOGRAM WITH TOMO AND CAD  COMPARISON:  Previous exam(s). ACR Breast Density Category d: The breast tissue is extremely dense, which lowers the sensitivity of mammography. FINDINGS: There are no findings suspicious for malignancy. Images were processed with CAD. IMPRESSION: No mammographic evidence of malignancy. A result letter of this screening mammogram will be mailed directly to the patient. RECOMMENDATION: Screening mammogram in one year. (Code:SM-B-01Y) BI-RADS CATEGORY  1: Negative. Electronically Signed   By: Abelardo Diesel M.D.   On: 04/26/2018 12:33    Assessment & Plan:   There are no diagnoses linked to this encounter.   No orders of the defined types were placed in this encounter.    Follow-up: No follow-ups on file.  Walker Kehr, MD

## 2018-06-01 NOTE — Patient Instructions (Addendum)
[facility]    Health Maintenance for Postmenopausal Women Menopause is a normal process in which your reproductive ability comes to an end. This process happens gradually over a span of months to years, usually between the ages of 48 and 55. Menopause is complete when you have missed 12 consecutive menstrual periods. It is important to talk with your health care provider about some of the most common conditions that affect postmenopausal women, such as heart disease, cancer, and bone loss (osteoporosis). Adopting a healthy lifestyle and getting preventive care can help to promote your health and wellness. Those actions can also lower your chances of developing some of these common conditions. What should I know about menopause? During menopause, you may experience a number of symptoms, such as:  Moderate-to-severe hot flashes.  Night sweats.  Decrease in sex drive.  Mood swings.  Headaches.  Tiredness.  Irritability.  Memory problems.  Insomnia.  Choosing to treat or not to treat menopausal changes is an individual decision that you make with your health care provider. What should I know about hormone replacement therapy and supplements? Hormone therapy products are effective for treating symptoms that are associated with menopause, such as hot flashes and night sweats. Hormone replacement carries certain risks, especially as you become older. If you are thinking about using estrogen or estrogen with progestin treatments, discuss the benefits and risks with your health care provider. What should I know about heart disease and stroke? Heart disease, heart attack, and stroke become more likely as you age. This may be due, in part, to the hormonal changes that your body experiences during menopause. These can affect how your body processes dietary fats, triglycerides, and cholesterol. Heart attack and stroke are both medical emergencies. There are many things that you can do to help  prevent heart disease and stroke:  Have your blood pressure checked at least every 1-2 years. High blood pressure causes heart disease and increases the risk of stroke.  If you are 55-79 years old, ask your health care provider if you should take aspirin to prevent a heart attack or a stroke.  Do not use any tobacco products, including cigarettes, chewing tobacco, or electronic cigarettes. If you need help quitting, ask your health care provider.  It is important to eat a healthy diet and maintain a healthy weight. ? Be sure to include plenty of vegetables, fruits, low-fat dairy products, and lean protein. ? Avoid eating foods that are high in solid fats, added sugars, or salt (sodium).  Get regular exercise. This is one of the most important things that you can do for your health. ? Try to exercise for at least 150 minutes each week. The type of exercise that you do should increase your heart rate and make you sweat. This is known as moderate-intensity exercise. ? Try to do strengthening exercises at least twice each week. Do these in addition to the moderate-intensity exercise.  Know your numbers.Ask your health care provider to check your cholesterol and your blood glucose. Continue to have your blood tested as directed by your health care provider.  What should I know about cancer screening? There are several types of cancer. Take the following steps to reduce your risk and to catch any cancer development as early as possible. Breast Cancer  Practice breast self-awareness. ? This means understanding how your breasts normally appear and feel. ? It also means doing regular breast self-exams. Let your health care provider know about any changes, no matter how small.    or older, have a clinician do a breast exam (clinical breast exam or CBE) every year. Depending on your age, family history, and medical history, it may be recommended that you also have a yearly breast X-ray (mammogram).  If you  have a family history of breast cancer, talk with your health care provider about genetic screening.  If you are at high risk for breast cancer, talk with your health care provider about having an MRI and a mammogram every year.  Breast cancer (BRCA) gene test is recommended for women who have family members with BRCA-related cancers. Results of the assessment will determine the need for genetic counseling and BRCA1 and for BRCA2 testing. BRCA-related cancers include these types: ? Breast. This occurs in males or females. ? Ovarian. ? Tubal. This may also be called fallopian tube cancer. ? Cancer of the abdominal or pelvic lining (peritoneal cancer). ? Prostate. ? Pancreatic.  Cervical, Uterine, and Ovarian Cancer Your health care provider may recommend that you be screened regularly for cancer of the pelvic organs. These include your ovaries, uterus, and vagina. This screening involves a pelvic exam, which includes checking for microscopic changes to the surface of your cervix (Pap test).  For women ages 21-65, health care providers may recommend a pelvic exam and a Pap test every three years. For women ages 30-65, they may recommend the Pap test and pelvic exam, combined with testing for human papilloma virus (HPV), every five years. Some types of HPV increase your risk of cervical cancer. Testing for HPV may also be done on women of any age who have unclear Pap test results.  Other health care providers may not recommend any screening for nonpregnant women who are considered low risk for pelvic cancer and have no symptoms. Ask your health care provider if a screening pelvic exam is right for you.  If you have had past treatment for cervical cancer or a condition that could lead to cancer, you need Pap tests and screening for cancer for at least 20 years after your treatment. If Pap tests have been discontinued for you, your risk factors (such as having a new sexual partner) need to be  reassessed to determine if you should start having screenings again. Some women have medical problems that increase the chance of getting cervical cancer. In these cases, your health care provider may recommend that you have screening and Pap tests more often.  If you have a family history of uterine cancer or ovarian cancer, talk with your health care provider about genetic screening.  If you have vaginal bleeding after reaching menopause, tell your health care provider.  There are currently no reliable tests available to screen for ovarian cancer.  Lung Cancer Lung cancer screening is recommended for adults 55-80 years old who are at high risk for lung cancer because of a history of smoking. A yearly low-dose CT scan of the lungs is recommended if you:  Currently smoke.  Have a history of at least 30 pack-years of smoking and you currently smoke or have quit within the past 15 years. A pack-year is smoking an average of one pack of cigarettes per day for one year.  Yearly screening should:  Continue until it has been 15 years since you quit.  Stop if you develop a health problem that would prevent you from having lung cancer treatment.  Colorectal Cancer  This type of cancer can be detected and can often be prevented.  Routine colorectal cancer screening usually begins at   age 50 and continues through age 75.  If you have risk factors for colon cancer, your health care provider may recommend that you be screened at an earlier age.  If you have a family history of colorectal cancer, talk with your health care provider about genetic screening.  Your health care provider may also recommend using home test kits to check for hidden blood in your stool.  A small camera at the end of a tube can be used to examine your colon directly (sigmoidoscopy or colonoscopy). This is done to check for the earliest forms of colorectal cancer.  Direct examination of the colon should be repeated every  5-10 years until age 75. However, if early forms of precancerous polyps or small growths are found or if you have a family history or genetic risk for colorectal cancer, you may need to be screened more often.  Skin Cancer  Check your skin from head to toe regularly.  Monitor any moles. Be sure to tell your health care provider: ? About any new moles or changes in moles, especially if there is a change in a mole's shape or color. ? If you have a mole that is larger than the size of a pencil eraser.  If any of your family members has a history of skin cancer, especially at a young age, talk with your health care provider about genetic screening.  Always use sunscreen. Apply sunscreen liberally and repeatedly throughout the day.  Whenever you are outside, protect yourself by wearing long sleeves, pants, a wide-brimmed hat, and sunglasses.  What should I know about osteoporosis? Osteoporosis is a condition in which bone destruction happens more quickly than new bone creation. After menopause, you may be at an increased risk for osteoporosis. To help prevent osteoporosis or the bone fractures that can happen because of osteoporosis, the following is recommended:  If you are 19-50 years old, get at least 1,000 mg of calcium and at least 600 mg of vitamin D per day.  If you are older than age 50 but younger than age 70, get at least 1,200 mg of calcium and at least 600 mg of vitamin D per day.  If you are older than age 70, get at least 1,200 mg of calcium and at least 800 mg of vitamin D per day.  Smoking and excessive alcohol intake increase the risk of osteoporosis. Eat foods that are rich in calcium and vitamin D, and do weight-bearing exercises several times each week as directed by your health care provider. What should I know about how menopause affects my mental health? Depression may occur at any age, but it is more common as you become older. Common symptoms of depression  include:  Low or sad mood.  Changes in sleep patterns.  Changes in appetite or eating patterns.  Feeling an overall lack of motivation or enjoyment of activities that you previously enjoyed.  Frequent crying spells.  Talk with your health care provider if you think that you are experiencing depression. What should I know about immunizations? It is important that you get and maintain your immunizations. These include:  Tetanus, diphtheria, and pertussis (Tdap) booster vaccine.  Influenza every year before the flu season begins.  Pneumonia vaccine.  Shingles vaccine.  Your health care provider may also recommend other immunizations. This information is not intended to replace advice given to you by your health care provider. Make sure you discuss any questions you have with your health care provider. Document Released: 10/30/2005   Document Revised: 03/27/2016 Document Reviewed: 06/11/2015 Elsevier Interactive Patient Education  2018 [employer]  

## 2018-06-01 NOTE — Assessment & Plan Note (Addendum)
Here for medicare wellness/physical  Diet: heart healthy  Physical activity: not sedentary  Depression/mood screen: negative  Hearing: intact to whispered voice  Visual acuity: grossly normal, performs annual eye exam  ADLs: capable  Fall risk: low to none  Home safety: good  Cognitive evaluation: intact to orientation, naming, recall and repetition  EOL planning: adv directives, full code/ I agree  I have personally reviewed and have noted  1. The patient's medical, surgical and social history  2. Their use of alcohol, tobacco or illicit drugs  3. Their current medications and supplements  4. The patient's functional ability including ADL's, fall risks, home safety risks and hearing or visual impairment.  5. Diet and physical activities  6. Evidence for depression or mood disorders - no 7. The roster of all physicians providing medical care to patient - is listed in the Snapshot section of the chart and reviewed today.    Today patient counseled on age appropriate routine health concerns for screening and prevention, each reviewed and up to date or declined. Immunizations reviewed and up to date or declined. Labs ordered and reviewed. Risk factors for depression reviewed and negative. Hearing function and visual acuity are intact. ADLs screened and addressed as needed. Functional ability and level of safety reviewed and appropriate. Education, counseling and referrals performed based on assessed risks today. Patient provided with a copy of personalized plan for preventive services.  Colon 2016 Dr Carlean Purl [facility]

## 2018-06-01 NOTE — Assessment & Plan Note (Signed)
Dr Denna Haggard

## 2018-06-03 ENCOUNTER — Other Ambulatory Visit (INDEPENDENT_AMBULATORY_CARE_PROVIDER_SITE_OTHER): Payer: PPO

## 2018-06-03 DIAGNOSIS — E785 Hyperlipidemia, unspecified: Secondary | ICD-10-CM | POA: Diagnosis not present

## 2018-06-03 DIAGNOSIS — R197 Diarrhea, unspecified: Secondary | ICD-10-CM | POA: Diagnosis not present

## 2018-06-03 DIAGNOSIS — Z0000 Encounter for general adult medical examination without abnormal findings: Secondary | ICD-10-CM

## 2018-06-03 LAB — BASIC METABOLIC PANEL
BUN: 14 mg/dL (ref 6–23)
CHLORIDE: 101 meq/L (ref 96–112)
CO2: 28 meq/L (ref 19–32)
Calcium: 10 mg/dL (ref 8.4–10.5)
Creatinine, Ser: 0.68 mg/dL (ref 0.40–1.20)
GFR: 90.23 mL/min (ref >60.00)
Glucose, Bld: 86 mg/dL (ref 70–99)
POTASSIUM: 4.8 meq/L (ref 3.5–5.1)
Sodium: 140 mEq/L (ref 135–145)

## 2018-06-03 LAB — URINALYSIS
Bilirubin Urine: NEGATIVE
Ketones, ur: NEGATIVE
Leukocytes, UA: NEGATIVE
NITRITE: NEGATIVE
Specific Gravity, Urine: <=1.005 — AB (ref 1.000–1.030)
TOTAL PROTEIN, URINE-UPE24: NEGATIVE
Urine Glucose: NEGATIVE
Urobilinogen, UA: 0.2 (ref 0.0–1.0)
pH: 6 (ref 5.0–8.0)

## 2018-06-03 LAB — HEPATIC FUNCTION PANEL
ALT: 17 U/L (ref 0–35)
AST: 37 U/L (ref 0–37)
Albumin: 4.6 g/dL (ref 3.5–5.2)
Alkaline Phosphatase: 75 U/L (ref 39–117)
BILIRUBIN DIRECT: 0.2 mg/dL (ref 0.0–0.3)
Total Bilirubin: 1.1 mg/dL (ref 0.2–1.2)
Total Protein: 7.1 g/dL (ref 6.0–8.3)

## 2018-06-03 LAB — CBC WITH DIFFERENTIAL/PLATELET
Basophils Absolute: 0 10*3/uL (ref 0.0–0.1)
Basophils Relative: 0.6 % (ref 0.0–3.0)
EOS PCT: 3.1 % (ref 0.0–5.0)
Eosinophils Absolute: 0.2 10*3/uL (ref 0.0–0.7)
HCT: 43.2 % (ref 36.0–46.0)
HEMOGLOBIN: 14.4 g/dL (ref 12.0–15.0)
Lymphocytes Relative: 34.7 % (ref 12.0–46.0)
Lymphs Abs: 2 10*3/uL (ref 0.7–4.0)
MCHC: 33.3 g/dL (ref 30.0–36.0)
MCV: 92.9 fl (ref 78.0–100.0)
MONO ABS: 0.7 10*3/uL (ref 0.1–1.0)
MONOS PCT: 11.7 % (ref 3.0–12.0)
Neutro Abs: 2.8 10*3/uL (ref 1.4–7.7)
Neutrophils Relative %: 49.9 % (ref 43.0–77.0)
Platelets: 270 10*3/uL (ref 150.0–400.0)
RBC: 4.65 Mil/uL (ref 3.87–5.11)
RDW: 12.5 % (ref 11.5–15.5)
WBC: 5.7 10*3/uL (ref 4.0–10.5)

## 2018-06-03 LAB — LIPID PANEL
CHOLESTEROL: 190 mg/dL (ref 0–200)
HDL: 83.4 mg/dL (ref >39.00)
LDL CALC: 93 mg/dL (ref 0–99)
NonHDL: 106.9
TRIGLYCERIDES: 71 mg/dL (ref 0.0–149.0)
Total CHOL/HDL Ratio: 2
VLDL: 14.2 mg/dL (ref 0.0–40.0)

## 2018-06-03 LAB — TSH: TSH: 2.68 u[IU]/mL (ref 0.35–4.50)

## 2018-06-27 ENCOUNTER — Ambulatory Visit (INDEPENDENT_AMBULATORY_CARE_PROVIDER_SITE_OTHER): Payer: PPO | Admitting: *Deleted

## 2018-06-27 DIAGNOSIS — Z23 Encounter for immunization: Secondary | ICD-10-CM | POA: Diagnosis not present

## 2018-08-04 ENCOUNTER — Other Ambulatory Visit: Payer: Self-pay

## 2018-08-04 DIAGNOSIS — L739 Follicular disorder, unspecified: Secondary | ICD-10-CM | POA: Diagnosis not present

## 2018-08-04 DIAGNOSIS — D485 Neoplasm of uncertain behavior of skin: Secondary | ICD-10-CM | POA: Diagnosis not present

## 2018-08-04 DIAGNOSIS — L821 Other seborrheic keratosis: Secondary | ICD-10-CM | POA: Diagnosis not present

## 2018-08-04 DIAGNOSIS — D229 Melanocytic nevi, unspecified: Secondary | ICD-10-CM | POA: Diagnosis not present

## 2018-08-04 DIAGNOSIS — D492 Neoplasm of unspecified behavior of bone, soft tissue, and skin: Secondary | ICD-10-CM | POA: Diagnosis not present

## 2018-08-25 ENCOUNTER — Encounter: Payer: Self-pay | Admitting: Family Medicine

## 2018-08-25 ENCOUNTER — Ambulatory Visit (INDEPENDENT_AMBULATORY_CARE_PROVIDER_SITE_OTHER): Payer: PPO | Admitting: Family Medicine

## 2018-08-25 VITALS — BP 106/68 | HR 63 | Temp 97.6°F | Ht 65.5 in | Wt 96.1 lb

## 2018-08-25 DIAGNOSIS — M7989 Other specified soft tissue disorders: Secondary | ICD-10-CM

## 2018-08-25 MED ORDER — CEPHALEXIN 500 MG PO CAPS: 500.0000 mg | ORAL_CAPSULE | Freq: Two times a day (BID) | ORAL | 0 refills | Status: DC

## 2018-08-25 NOTE — Progress Notes (Signed)
Subjective:    Patient ID: [name], female    DOB: [birth year], 72 y.o.   MRN: [id number]  HPI  [name] is a 72 year old female who presents with right fourth finger swelling and discomfort that has been present for approximately one month.  She reports seeing a dermatology provider on 08/04/18 for another reason however this provider also looked at her finger at that time. She noted discomfort in an area where she believes she had a thorn in this area from working in her yard. She states that the dermatology provider noted "pus" in the area and removed what she believed was a thorn but she is not sure.   Following that visit, she noticed improvement in this area but approximately one week ago she noticed swelling and discomfort with movement.  Treatment with neosporin has provided limited benefit.  Associated discomfort with movement and swelling are present. She denies any drainage in this area. No history of trauma since initial possible thorn after trimming limbs in her yard.  Erythema:no Warmth: mild  Edema:yes Fever:no Chills:no Malaise: no Headache:no  Review of Systems  Constitutional: Negative for chills, fatigue and fever.  Respiratory: Negative for cough, shortness of breath and wheezing.   Cardiovascular: Negative for chest pain.  Gastrointestinal: Negative for abdominal pain.    See HPI for further ROS   Past Medical History:  Diagnosis Date  . Adenomatous colon polyp   . Atrophic vaginitis   . Benign neoplasm of colon 01/18/2008  . DIVERTICULOSIS, MILD 10/26/2007   colonoscopy 10/26/07  . H pylori ulcer   . HEMORRHOIDS, INTERNAL 01/18/2008  . OSTEOPENIA 08/2014   T score -2.0 FRAX 6.9%/0.9%  . Skin disorder    NOT PARAPSORIASIS--ETIOLOGY UNCLEAR  . Ulcer 01/31/06   STOMACH ULCER     Social History   Socioeconomic History  . Marital status: Married    Spouse name: Not on file  . Number of children: Not on file  . Years of education: Not on file   . Highest education level: Not on file  Occupational History  . Occupation: Optometrist  Social Needs  . Financial resource strain: Not on file  . Food insecurity:    Worry: Not on file    Inability: Not on file  . Transportation needs:    Medical: Not on file    Non-medical: Not on file  Tobacco Use  . Smoking status: Never Smoker  . Smokeless tobacco: Never Used  Substance and Sexual Activity  . Alcohol use: Yes    Alcohol/week: 7.0 standard drinks    Types: 7 Glasses of wine per week  . Drug use: No  . Sexual activity: Yes    Birth control/protection: Surgical, Post-menopausal    Comment: BTL-1st intercourse 72 yo-Fewer than 5 partners  Lifestyle  . Physical activity:    Days per week: Not on file    Minutes per session: Not on file  . Stress: Not on file  Relationships  . Social connections:    Talks on phone: Not on file    Gets together: Not on file    Attends religious service: Not on file    Active member of club or organization: Not on file    Attends meetings of clubs or organizations: Not on file    Relationship status: Not on file  . Intimate partner violence:    Fear of current or ex partner: Not on file    Emotionally abused: Not on file  Physically abused: Not on file    Forced sexual activity: Not on file  Other Topics Concern  . Not on file  Social History Narrative   Married without children. 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 3 coffees daily.   Regular exercise-yes   01/09/2015    Past Surgical History:  Procedure Laterality Date  . APPENDECTOMY  1963  . COLONOSCOPY W/ BIOPSIES     multiple  . ESOPHAGOGASTRODUODENOSCOPY  03/30/2006  . GANGLION CYST EXCISION  1972   Right  . Gastric Ulcer and GI Bleed (hospitalization)  5/07  . LYMPH NODE BIOPSY  2001   Benign from the groin-Inflammatory changes due to her skin condition.  . s/p eradication of H. Pylori     confirmed by urea breath teast  . SKIN CANCER EXCISION      removed from the forehead  . TONSILLECTOMY  1955  . TUBAL LIGATION      Family History  Problem Relation Age of Onset  . Breast cancer Mother        Age 58's  . Heart disease Mother   . Heart disease Father   . Breast cancer Maternal Aunt        Age 59's  . Heart disease Maternal Grandmother   . Colon cancer Maternal Grandfather   . Breast cancer Cousin        maternal  . Colon cancer Cousin   . Colon cancer Maternal Uncle   . Breast cancer Cousin   . Breast cancer Cousin     Allergies  Allergen Reactions  . Fosamax [Alendronate Sodium] Other (See Comments)    Abdominal pain    Current Outpatient Medications on File Prior to Visit  Medication Sig Dispense Refill  . cholecalciferol (VITAMIN D) 1000 UNITS tablet Take 2,000 Units by mouth daily.     . Omega-3 Fatty Acids (FISH OIL) 1000 MG CAPS Take 1 capsule by mouth daily.      . vitamin C (ASCORBIC ACID) 500 MG tablet Take 500 mg by mouth daily.       Current Facility-Administered Medications on File Prior to Visit  Medication Dose Route Frequency Provider Last Rate Last Dose  . methylPREDNISolone acetate (DEPO-MEDROL) injection 80 mg  80 mg Intramuscular Once Hulan Saas M, DO        BP 106/68 (BP Location: Left Arm, Patient Position: Sitting, Cuff Size: Normal)   Pulse 63   Temp 97.6 F (36.4 C) (Oral)   Ht 5' 5.5" (1.664 m)   Wt 96 lb 1.3 oz (43.6 kg)   SpO2 96%   BMI 15.75 kg/m       Objective:   Physical Exam  Constitutional: She appears well-developed and well-nourished.  Eyes: Pupils are equal, round, and reactive to light. No scleral icterus.  Cardiovascular: Normal rate and regular rhythm.  Pulmonary/Chest: Effort normal and breath sounds normal.  Abdominal: Soft. Bowel sounds are normal. There is no tenderness.  Lymphadenopathy:    She has no cervical adenopathy.  Skin: Skin is warm and dry. Capillary refill takes less than 2 seconds.  Right fourth finger exhibits mild edema and warmth around  area that was drained per patient. No fluctuance or drainage appreciated, no erythema present. No foreign body appreciated. She is able to fully flex and extend with mild discomfort due to edema present   Psychiatric: She has a normal mood and affect. Her behavior is normal. Judgment and thought content normal.      Assessment & Plan:  1. Swelling of right ring finger Mild edema present; symptoms are most consistent with cellulitis. No fluctuance appreciated. Will provide course of cephalexin and advised use of warm compresses. Advised close monitoring and follow up if symptoms are not improving or worsening. Particularly reviewed need to follow up if fever, joint pain, or red streaks from area. Return precautions provided.   - cephALEXin (KEFLEX) 500 MG capsule; Take 1 capsule (500 mg total) by mouth 2 (two) times daily.  Dispense: 10 capsule; Refill: 0  Delano Metz, FNP-C

## 2018-08-25 NOTE — Patient Instructions (Signed)
Please take medication as directed with food.  If no improvement or symptoms worsen, follow up with your provider for further evaluation.  Can use warm compresses no more than 15 minutes/hour for symptoms.     Cellulitis, Adult Cellulitis is a skin infection. The infected area is usually red and sore. This condition occurs most often in the arms and lower legs. It is very important to get treated for this condition. Follow these instructions at home:  Take over-the-counter and prescription medicines only as told by your doctor.  If you were prescribed an antibiotic medicine, take it as told by your doctor. Do not stop taking the antibiotic even if you start to feel better.  Drink enough fluid to keep your pee (urine) clear or pale yellow.  Do not touch or rub the infected area.  Raise (elevate) the infected area above the level of your heart while you are sitting or lying down.  Place warm or cold wet cloths (warm or cold compresses) on the infected area. Do this as told by your doctor.  Keep all follow-up visits as told by your doctor. This is important. These visits let your doctor make sure your infection is not getting worse. Contact a doctor if:  You have a fever.  Your symptoms do not get better after 1-2 days of treatment.  Your bone or joint under the infected area starts to hurt after the skin has healed.  Your infection comes back. This can happen in the same area or another area.  You have a swollen bump in the infected area.  You have new symptoms.  You feel ill and also have muscle aches and pains. Get help right away if:  Your symptoms get worse.  You feel very sleepy.  You throw up (vomit) or have watery poop (diarrhea) for a long time.  There are red streaks coming from the infected area.  Your red area gets larger.  Your red area turns darker. This information is not intended to replace advice given to you by your health care provider. Make sure  you discuss any questions you have with your health care provider. Document Released: 02/24/2008 Document Revised: 02/13/2016 Document Reviewed: 07/17/2015 Elsevier Interactive Patient Education  2018 [employer].

## 2018-08-27 IMAGING — DX DG CHEST 2V
2 series · 2 of 2 positions shown · non-contrast
Comparison: Two-view chest x-ray 10/04/2012

CLINICAL DATA: Chest flutter.  Exertion and cough.

EXAM:
CHEST  2 VIEW

[chest pa]
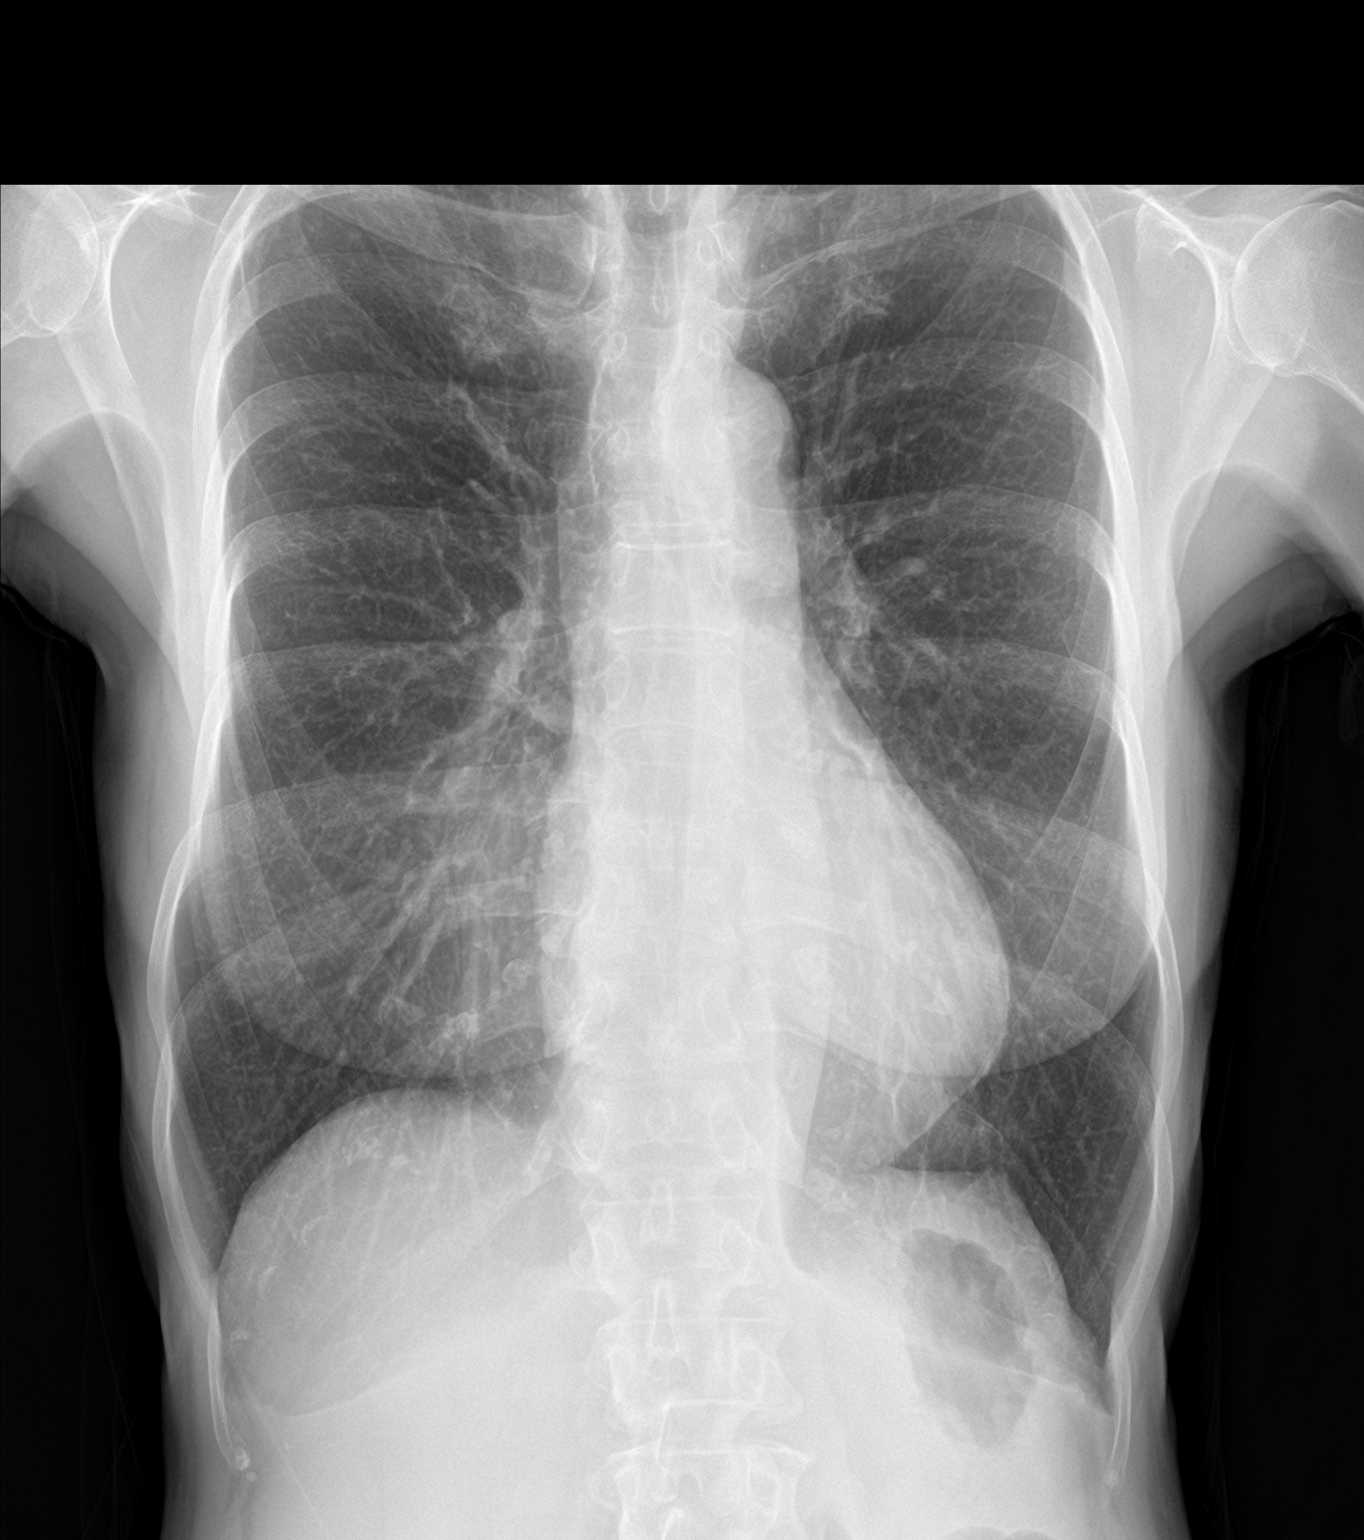

[chest lat]
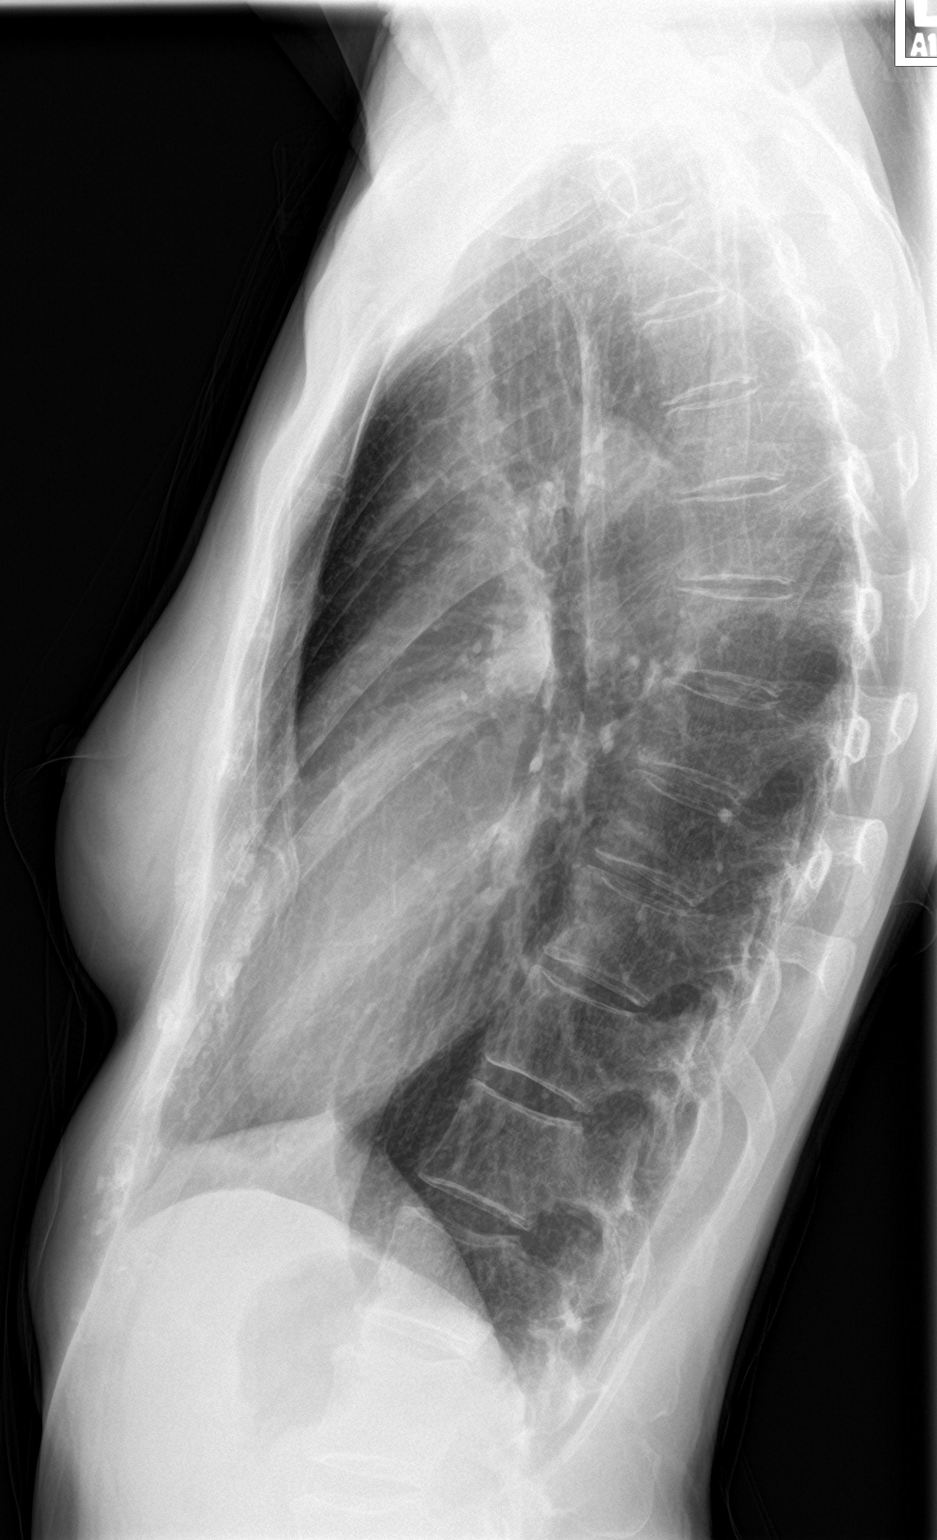

[2 of 2 positions shown; findings below may reference images not displayed]

FINDINGS: Heart size normal. Lungs are clear. The visualized soft tissues and
bony thorax are unremarkable.
IMPRESSION: Negative two view chest x-ray

## 2018-08-29 ENCOUNTER — Ambulatory Visit (INDEPENDENT_AMBULATORY_CARE_PROVIDER_SITE_OTHER): Payer: PPO | Admitting: Internal Medicine

## 2018-08-29 ENCOUNTER — Encounter: Payer: Self-pay | Admitting: Internal Medicine

## 2018-08-29 ENCOUNTER — Ambulatory Visit: Payer: Self-pay

## 2018-08-29 VITALS — BP 100/70 | HR 68 | Temp 98.1°F | Ht 65.5 in | Wt 95.8 lb

## 2018-08-29 DIAGNOSIS — M7989 Other specified soft tissue disorders: Secondary | ICD-10-CM | POA: Diagnosis not present

## 2018-08-29 MED ORDER — VALACYCLOVIR HCL 1 G PO TABS: 1000.0000 mg | ORAL_TABLET | Freq: Two times a day (BID) | ORAL | 0 refills | Status: DC

## 2018-08-29 NOTE — Telephone Encounter (Signed)
noted 

## 2018-08-29 NOTE — Telephone Encounter (Signed)
Returned call to patient who states she has been on antibiotic since Thursday for and infected rt ring finger.  She states that he finger remains swollen and the redness is increasing. Last tetanus 2012.  Pt deny's fever. She rates the pain to the finger as mild but is concerned that the redness is increasing and the finger remains swollen. Appointment scheduled per protocol. Care advice read to patient. Pt verbalized understanding of all instructions.  Reason for Disposition . [1] Last tetanus shot > 5 years ago AND [2] DIRTY cut or scrape  Answer Assessment - Initial Assessment Questions 1. MECHANISM: "How did the injury happen?"      Nov 1st 2. ONSET: "When did the injury happen?" (Minutes or hours ago)      Nov 1st 3. LOCATION: "What part of the finger is injured?" "Is the nail damaged?"      Rt ring finger 4. APPEARANCE of the INJURY: "What does the injury look like?"      Redness increasing 5. SEVERITY: "Can you use the hand normally?"  "Can you bend your fingers into a ball and then fully open them?"     severe 6. SIZE: For cuts, bruises, or swelling, ask: "How large is it?" (e.g., inches or centimeters;  entire finger)      Entire finger 7. PAIN: "Is there pain?" If so, ask: "How bad is the pain?"    (e.g., Scale 1-10; or mild, moderate, severe)     2 8. TETANUS: For any breaks in the skin, ask: "When was the last tetanus booster?"     unsure 9. OTHER SYMPTOMS: "Do you have any other symptoms?"     no 10. PREGNANCY: "Is there any chance you are pregnant?" "When was your last menstrual period?"       No  Protocols used: FINGER INJURY-A-AH

## 2018-08-29 NOTE — Progress Notes (Signed)
   Subjective:    Patient ID: [name], female    DOB: 1946/07/01, 72 y.o.   MRN: [id number]  HPI The patient is a 72 YO female coming in for right 4th finger swelling and redness and pain. Started about 1 month ago when she was outside trimming shrubs (normal for her to do) and got a thorn in her finger. She thought she removed it. Saw dermatology about 2 weeks later (08/04/18) and still had some swelling there and pain. They saw it and drained and took out what looked like thorn bit (patient did not watch and states she recalls this vaguely) and was not given antibiotic at this time. She had relief of symptoms for a week or so and then the swelling and pain returned. The original site of swelling was down close to nail bed. This area and half of finger was swelling. Distal to the PIP was swollen and red. She was seen on 08/24/18 and diagnosed with cellulitis and given keflex. She has taken this since that time and has had worsening swelling, some heat and still redness (not sure if this is changed at all). Her ring has not fit on that finger for 2-3 weeks. Denies fevers. Does have a new red rash close to the PIP joint on the side of the finger. Denies blistering at this site. Pain and some numbness and tingling in the finger and at the base of the finger. She is now having swelling throughout the whole finger to the base of the finger.   Review of Systems  Constitutional: Negative.   HENT: Negative.   Eyes: Negative.   Respiratory: Negative for cough, chest tightness and shortness of breath.   Cardiovascular: Negative for chest pain, palpitations and leg swelling.  Gastrointestinal: Negative for abdominal distention, abdominal pain, constipation, diarrhea, nausea and vomiting.  Musculoskeletal: Positive for arthralgias, joint swelling and myalgias.  Skin: Positive for color change and rash.  Neurological: Negative.   Psychiatric/Behavioral: Negative.       Objective:   Physical Exam    Constitutional: She is oriented to person, place, and time. She appears well-developed and well-nourished.  HENT:  Head: Normocephalic and atraumatic.  Eyes: EOM are normal.  Neck: Normal range of motion.  Cardiovascular: Normal rate and regular rhythm.  Pulmonary/Chest: Effort normal and breath sounds normal. No respiratory distress. She has no wheezes. She has no rales.  Musculoskeletal: She exhibits no edema.  Neurological: She is alert and oriented to person, place, and time. Coordination normal.  Skin: Skin is warm and dry. Rash noted.  Right 4th finger swollen throughout the finger, at the base of the nail healed without abscess or purulent drainage or fluctuance. There is a patch of red dots close to the PIP which are non-blistering but tender to touch. Both DIP and PIP joints are tender to touch. ROM is limited. Pain with ROM 3rd finger, no pain ROM 5th finger. No swelling or redness other fingers. No tracking up the arm or onto the pad of the hand right.    Vitals:   08/29/18 1433  BP: 100/70  Pulse: 68  Temp: 98.1 F (36.7 C)  TempSrc: Oral  SpO2: 99%  Weight: 95 lb 12.8 oz (43.5 kg)  Height: 5' 5.5" (1.664 m)      Assessment & Plan:

## 2018-08-29 NOTE — Assessment & Plan Note (Signed)
Looks compatible with herpetic on exam so will start valtrex TID for 1 week. Referral urgent to hand surgeon to evaluate for persistent abscess or tendon involvement. Etiology of the swelling is unclear. She is starting to have symptoms of numbness and stinging from the swelling. Advised to use ice on the area. Taking otc for pain. Finish course of keflex although there is not a clear cellulitis on exam today.

## 2018-08-29 NOTE — Patient Instructions (Signed)
We have sent in valtrex to take 1 pill 3 times per day for 1 week.   We will work on getting you in with the hand specialist in case this does not work.

## 2018-08-30 ENCOUNTER — Telehealth: Payer: Self-pay

## 2018-08-30 NOTE — Telephone Encounter (Signed)
Should be TID

## 2018-08-30 NOTE — Telephone Encounter (Signed)
Copied from [place] [id number]. Topic: General - Other >> Aug 29, 2018  4:28 PM Carolyn Stare wrote:  Pt said she picked up the rx for valACYclovir (VALTREX) 1000 MG tablet  and said she was told verbally to take the medicine 3 times a day,when she picked up the medicine it say take 1 pill 2 times a day and she call to ask which one she should follow >> Aug 30, 2018  8:55 AM Scherrie Gerlach wrote: Pt states she thought she was told to take this med TID, but instructions state differently.  Pt would like to know asap. Dr: [name] KitchenWe have sent in valtrex to take 1 pill 3 times per day for 1 week."

## 2018-08-30 NOTE — Telephone Encounter (Signed)
Patient informed of MD response and stated understanding  

## 2018-09-01 ENCOUNTER — Encounter (INDEPENDENT_AMBULATORY_CARE_PROVIDER_SITE_OTHER): Payer: Self-pay | Admitting: Orthopaedic Surgery

## 2018-09-01 ENCOUNTER — Telehealth (INDEPENDENT_AMBULATORY_CARE_PROVIDER_SITE_OTHER): Payer: Self-pay | Admitting: Orthopaedic Surgery

## 2018-09-01 ENCOUNTER — Ambulatory Visit (INDEPENDENT_AMBULATORY_CARE_PROVIDER_SITE_OTHER): Payer: PPO

## 2018-09-01 ENCOUNTER — Ambulatory Visit (INDEPENDENT_AMBULATORY_CARE_PROVIDER_SITE_OTHER): Payer: PPO | Admitting: Orthopaedic Surgery

## 2018-09-01 DIAGNOSIS — M79641 Pain in right hand: Secondary | ICD-10-CM

## 2018-09-01 MED ORDER — TERBINAFINE HCL 250 MG PO TABS: 500.0000 mg | ORAL_TABLET | Freq: Two times a day (BID) | ORAL | 0 refills | Status: DC

## 2018-09-01 MED ORDER — ITRACONAZOLE 200 MG PO TABS: 200.0000 mg | ORAL_TABLET | Freq: Two times a day (BID) | ORAL | 0 refills | Status: DC

## 2018-09-01 NOTE — Telephone Encounter (Signed)
I sent in a new rx

## 2018-09-01 NOTE — Progress Notes (Signed)
Office Visit Note   Patient: [name]           Date of Birth: Jun 03, 1946           MRN: [id number] Visit Date: 09/01/2018              Requested by: Hoyt Koch, MD [place], [place] [id number] PCP: Plotnikov, Evie Lacks, MD   Assessment & Plan: Visit Diagnoses:  1. Pain in right hand     Plan: Impression is right ring finger herpetic whitlow versus sporothrix schenkii.  She is not having improvement since she has been placed on Valtrex.  I would like to add itraconazole to cover possible Sporothrix infection.  I would like to recheck her in a week.  Patient in agreement and questions encouraged and answered.  Follow-Up Instructions: Return in about 1 week (around 09/08/2018).   Orders:  Orders Placed This Encounter  Procedures  . XR Hand Complete Right   Meds ordered this encounter  Medications  . Itraconazole 200 MG TABS    Sig: Take 200 mg by mouth 2 (two) times daily.    Dispense:  56 tablet    Refill:  0      Procedures: No procedures performed   Clinical Data: No additional findings.   Subjective: Chief Complaint  Patient presents with  . Right Ring Finger - Edema    [name] is a 72 year old female comes in with redness and swelling of her right ring finger for several weeks.  She states that this first began after she was gardening and she was pricked by a flower thorn.  This then eventually developed into redness and swelling and some cutaneous lesions.  She was first given Keflex by her PCP but she had no clinical improvement.  She was then placed on Valtrex and she states that she has noticed some improvement in redness and swelling.  She denies any constitutional symptoms.  Her pain has significantly improved.   Review of Systems  Constitutional: Negative.   HENT: Negative.   Eyes: Negative.   Respiratory: Negative.   Cardiovascular: Negative.   Endocrine: Negative.   Musculoskeletal: Negative.   Neurological:  Negative.   Hematological: Negative.   Psychiatric/Behavioral: Negative.   All other systems reviewed and are negative.    Objective: Vital Signs: There were no vitals taken for this visit.  Physical Exam Vitals signs and nursing note reviewed.  Constitutional:      Appearance: [name].  HENT:     Head: Normocephalic and atraumatic.  Neck:     Musculoskeletal: Neck supple.  Pulmonary:     Effort: Pulmonary effort is normal.  Abdominal:     Palpations: Abdomen is soft.  Skin:    General: Skin is warm.     Capillary Refill: Capillary refill takes less than 2 seconds.  Neurological:     Mental Status: She is alert and oriented to person, place, and time.  Psychiatric:        Behavior: Behavior normal.        Thought Content: Thought content normal.        Judgment: Judgment normal.     Ortho Exam Right ring finger exam shows mild to moderate swelling with some mild erythema.  She does not have a sausage digit.  No pain along the flexor tendon sheath.  It appears that she has had some partially resolved skin lesions on the radial side.  There is no drainage.  No evidence of paronychia. Specialty Comments:  No specialty comments available.  Imaging: Xr Hand Complete Right  Result Date: 09/01/2018 No acute or structural abnormalities.    PMFS History: Patient Active Problem List   Diagnosis Date Noted  . Swelling of finger of right hand 08/29/2018  . Neoplasm of uncertain behavior of skin 06/01/2018  . Fluttering heart 05/27/2017  . Diarrhea 05/27/2017  . Cervical adenopathy 05/27/2017  . Right Achilles tendinitis 08/04/2016  . Well adult exam 04/27/2016  . Neck pain on left side 04/27/2016  . Lightheadedness 12/02/2015  . Metatarsal stress fracture of left foot 04/26/2015  . Arthralgia 10/23/2014  . Degenerative disc disease, lumbar 12/14/2013  . Acute low back pain 12/09/2013  . Piriformis syndrome of left side 08/07/2013  . Cough 10/04/2012    . Atrophic vaginitis   . Skin disorder   . Ulcer   . PNEUMONIA 02/07/2010  . FEVER UNSPECIFIED 02/07/2010  . SINUSITIS, ACUTE 07/31/2008  . LOW BACK PAIN 07/03/2008  . Chronic fatigue disorder 07/03/2008  . UPPER RESPIRATORY INFECTION (URI) 02/21/2008  . Benign neoplasm of colon 01/18/2008  . HEMORRHOIDS, INTERNAL 01/18/2008  . Osteopenia 01/18/2008  . Headache 01/18/2008  . CYSTITIS 12/14/2007  . PARESTHESIA 12/14/2007  . DIVERTICULOSIS, MILD 10/26/2007  . PERSONAL HISTORY OF PEPTIC ULCER DISEASE 10/26/2007  . GASTRITIS 06/10/2007  . COLONIC POLYPS, HX OF 06/10/2007   Past Medical History:  Diagnosis Date  . Adenomatous colon polyp   . Atrophic vaginitis   . Benign neoplasm of colon 01/18/2008  . DIVERTICULOSIS, MILD 10/26/2007   colonoscopy 10/26/07  . H pylori ulcer   . HEMORRHOIDS, INTERNAL 01/18/2008  . OSTEOPENIA 08/2014   T score -2.0 FRAX 6.9%/0.9%  . Skin disorder    NOT PARAPSORIASIS--ETIOLOGY UNCLEAR  . Ulcer 01/31/06   STOMACH ULCER    Family History  Problem Relation Age of Onset  . Breast cancer Mother        Age 49's  . Heart disease Mother   . Heart disease Father   . Breast cancer Maternal Aunt        Age 26's  . Heart disease Maternal Grandmother   . Colon cancer Maternal Grandfather   . Breast cancer Cousin        maternal  . Colon cancer Cousin   . Colon cancer Maternal Uncle   . Breast cancer Cousin   . Breast cancer Cousin     Past Surgical History:  Procedure Laterality Date  . APPENDECTOMY  1963  . COLONOSCOPY W/ BIOPSIES     multiple  . ESOPHAGOGASTRODUODENOSCOPY  03/30/2006  . GANGLION CYST EXCISION  1972   Right  . Gastric Ulcer and GI Bleed (hospitalization)  5/07  . LYMPH NODE BIOPSY  2001   Benign from the groin-Inflammatory changes due to her skin condition.  . s/p eradication of H. Pylori     confirmed by urea breath teast  . SKIN CANCER EXCISION     removed from the forehead  . TONSILLECTOMY  1955  . TUBAL LIGATION      Social History   Occupational History  . Occupation: Optometrist  Tobacco Use  . Smoking status: Never Smoker  . Smokeless tobacco: Never Used  Substance and Sexual Activity  . Alcohol use: Yes    Alcohol/week: 7.0 standard drinks    Types: 7 Glasses of wine per week  . Drug use: No  . Sexual activity: Yes    Birth control/protection: Surgical, Post-menopausal  Comment: BTL-1st intercourse 72 yo-Fewer than 5 partners

## 2018-09-01 NOTE — Telephone Encounter (Signed)
Patient called stated had appt this am and was given a RX for a medication that her insurance will not pay for. It will cost her $1200 and requesting to get something else written for her.  Please call patient to advise.  [phone]

## 2018-09-01 NOTE — Telephone Encounter (Signed)
See message below °

## 2018-09-02 NOTE — Telephone Encounter (Signed)
Called patient to advise  °

## 2018-09-08 ENCOUNTER — Encounter (INDEPENDENT_AMBULATORY_CARE_PROVIDER_SITE_OTHER): Payer: Self-pay | Admitting: Orthopaedic Surgery

## 2018-09-08 ENCOUNTER — Ambulatory Visit (INDEPENDENT_AMBULATORY_CARE_PROVIDER_SITE_OTHER): Payer: PPO | Admitting: Orthopaedic Surgery

## 2018-09-08 DIAGNOSIS — M79641 Pain in right hand: Secondary | ICD-10-CM | POA: Insufficient documentation

## 2018-09-08 NOTE — Progress Notes (Signed)
Office Visit Note   Patient: [name]           Date of Birth: [birth year]           MRN: [id number] Visit Date: 09/08/2018              Requested by: Cassandria Anger, MD [place], [place] [address] PCP: Cassandria Anger, MD   Assessment & Plan: Visit Diagnoses:  1. Pain in right hand     Plan: Impression is right ring finger but it whitlow versus Sporothrix infection.  The patient will continue her antifungal until she has completed the 4 week course of treatment.  Follow-up with [place] in 4 weeks time for repeat evaluation.  Call with concerns or questions in the meantime.  Follow-Up Instructions: Return in about 4 weeks (around 10/06/2018).   Orders:  No orders of the defined types were placed in this encounter.  No orders of the defined types were placed in this encounter.     Procedures: No procedures performed   Clinical Data: No additional findings.   Subjective: Chief Complaint  Patient presents with  . Right Hand - Pain, Follow-up    HPI patient is a pleasant 72 year old female presents our clinic today for follow-up of her right ring finger.  Initial injury was several weeks ago when she was stuck by a flower thorn.  She was seen in our office about a week ago where she was diagnosed with herpetic whitlow versus possible Sporothrix infection.  She has been on antiviral which she finished this past Monday.  She also started an antifungal last Thursday.  She does note slight improvement over the past 3 days.  She still has swelling.  No fevers or chills.  She does note increased headaches and depression following the start of the antifungal.  Review of Systems as detailed in HPI.  All others reviewed and are negative.   Objective: Vital Signs: There were no vitals taken for this visit.  Physical Exam well-developed well-nourished female in no acute distress.  Alert and oriented x3.  Ortho Exam examination of her right ring finger shows  moderate swelling.  He still has slight erythema to the medial aspect of the DIP joint.  This is nontender.  She does have a little tenderness with an ace the DIP joint.  She has full range of motion.  Specialty Comments:  No specialty comments available.  Imaging: No new imaging   PMFS History: Patient Active Problem List   Diagnosis Date Noted  . Pain in right hand 09/08/2018  . Swelling of finger of right hand 08/29/2018  . Neoplasm of uncertain behavior of skin 06/01/2018  . Fluttering heart 05/27/2017  . Diarrhea 05/27/2017  . Cervical adenopathy 05/27/2017  . Right Achilles tendinitis 08/04/2016  . Well adult exam 04/27/2016  . Neck pain on left side 04/27/2016  . Lightheadedness 12/02/2015  . Metatarsal stress fracture of left foot 04/26/2015  . Arthralgia 10/23/2014  . Degenerative disc disease, lumbar 12/14/2013  . Acute low back pain 12/09/2013  . Piriformis syndrome of left side 08/07/2013  . Cough 10/04/2012  . Atrophic vaginitis   . Skin disorder   . Ulcer   . PNEUMONIA 02/07/2010  . FEVER UNSPECIFIED 02/07/2010  . SINUSITIS, ACUTE 07/31/2008  . LOW BACK PAIN 07/03/2008  . Chronic fatigue disorder 07/03/2008  . UPPER RESPIRATORY INFECTION (URI) 02/21/2008  . Benign neoplasm of colon 01/18/2008  . HEMORRHOIDS, INTERNAL 01/18/2008  .  Osteopenia 01/18/2008  . Headache 01/18/2008  . CYSTITIS 12/14/2007  . PARESTHESIA 12/14/2007  . DIVERTICULOSIS, MILD 10/26/2007  . PERSONAL HISTORY OF PEPTIC ULCER DISEASE 10/26/2007  . GASTRITIS 06/10/2007  . COLONIC POLYPS, HX OF 06/10/2007   Past Medical History:  Diagnosis Date  . Adenomatous colon polyp   . Atrophic vaginitis   . Benign neoplasm of colon 01/18/2008  . DIVERTICULOSIS, MILD 10/26/2007   colonoscopy 10/26/07  . H pylori ulcer   . HEMORRHOIDS, INTERNAL 01/18/2008  . OSTEOPENIA 08/2014   T score -2.0 FRAX 6.9%/0.9%  . Skin disorder    NOT PARAPSORIASIS--ETIOLOGY UNCLEAR  . Ulcer 01/31/06   STOMACH ULCER     Family History  Problem Relation Age of Onset  . Breast cancer Mother        Age 67's  . Heart disease Mother   . Heart disease Father   . Breast cancer Maternal Aunt        Age 19's  . Heart disease Maternal Grandmother   . Colon cancer Maternal Grandfather   . Breast cancer Cousin        maternal  . Colon cancer Cousin   . Colon cancer Maternal Uncle   . Breast cancer Cousin   . Breast cancer Cousin     Past Surgical History:  Procedure Laterality Date  . APPENDECTOMY  1963  . COLONOSCOPY W/ BIOPSIES     multiple  . ESOPHAGOGASTRODUODENOSCOPY  03/30/2006  . GANGLION CYST EXCISION  1972   Right  . Gastric Ulcer and GI Bleed (hospitalization)  5/07  . LYMPH NODE BIOPSY  2001   Benign from the groin-Inflammatory changes due to her skin condition.  . s/p eradication of H. Pylori     confirmed by urea breath teast  . SKIN CANCER EXCISION     removed from the forehead  . TONSILLECTOMY  1955  . TUBAL LIGATION     Social History   Occupational History  . Occupation: Optometrist  Tobacco Use  . Smoking status: Never Smoker  . Smokeless tobacco: Never Used  Substance and Sexual Activity  . Alcohol use: Yes    Alcohol/week: 7.0 standard drinks    Types: 7 Glasses of wine per week  . Drug use: No  . Sexual activity: Yes    Birth control/protection: Surgical, Post-menopausal    Comment: BTL-1st intercourse 72 yo-Fewer than 5 partners

## 2018-09-11 ENCOUNTER — Other Ambulatory Visit (INDEPENDENT_AMBULATORY_CARE_PROVIDER_SITE_OTHER): Payer: Self-pay | Admitting: Orthopaedic Surgery

## 2018-09-12 DIAGNOSIS — H5310 Unspecified subjective visual disturbances: Secondary | ICD-10-CM | POA: Diagnosis not present

## 2018-09-12 DIAGNOSIS — H25811 Combined forms of age-related cataract, right eye: Secondary | ICD-10-CM | POA: Diagnosis not present

## 2018-09-12 DIAGNOSIS — H43811 Vitreous degeneration, right eye: Secondary | ICD-10-CM | POA: Diagnosis not present

## 2018-09-12 NOTE — Telephone Encounter (Signed)
yes

## 2018-09-12 NOTE — Telephone Encounter (Signed)
Ok for refill? 

## 2018-09-15 ENCOUNTER — Ambulatory Visit (INDEPENDENT_AMBULATORY_CARE_PROVIDER_SITE_OTHER): Payer: PPO | Admitting: Internal Medicine

## 2018-09-15 ENCOUNTER — Encounter: Payer: Self-pay | Admitting: Internal Medicine

## 2018-09-15 VITALS — BP 114/66 | HR 69 | Temp 97.7°F | Ht 65.5 in | Wt 95.0 lb

## 2018-09-15 DIAGNOSIS — H539 Unspecified visual disturbance: Secondary | ICD-10-CM | POA: Insufficient documentation

## 2018-09-15 DIAGNOSIS — L02511 Cutaneous abscess of right hand: Secondary | ICD-10-CM | POA: Diagnosis not present

## 2018-09-15 DIAGNOSIS — L02519 Cutaneous abscess of unspecified hand: Secondary | ICD-10-CM | POA: Insufficient documentation

## 2018-09-15 DIAGNOSIS — M542 Cervicalgia: Secondary | ICD-10-CM

## 2018-09-15 MED ORDER — CEPHALEXIN 500 MG PO CAPS: 500.0000 mg | ORAL_CAPSULE | Freq: Four times a day (QID) | ORAL | 0 refills | Status: DC

## 2018-09-15 NOTE — Assessment & Plan Note (Signed)
MSK Aleve prn

## 2018-09-15 NOTE — Assessment & Plan Note (Signed)
R 4th finger -  a thorn related Dr Erlinda Hong - PO Lamisil x 2 wks by now - will d/c due to visual  side effects  See I&D Keflex po if the redness came back

## 2018-09-15 NOTE — Assessment & Plan Note (Signed)
will d/c lamisil po today S/p nl eye exam

## 2018-09-15 NOTE — Patient Instructions (Signed)
Rose gloves

## 2018-09-15 NOTE — Progress Notes (Signed)
Subjective:  Patient ID: [name], female    DOB: December 07, 1945  Age: 72 y.o. MRN: [id number]  CC: No chief complaint on file.   HPI [name] presents for R finger abscess, recurrent sx's x 1 mo C/o "strobbing light" sensations at night x 2-3 nights C/o L neck and l shoulder pain  Outpatient Medications Prior to Visit  Medication Sig Dispense Refill  . cephALEXin (KEFLEX) 500 MG capsule Take 1 capsule (500 mg total) by mouth 2 (two) times daily. 10 capsule 0  . cholecalciferol (VITAMIN D) 1000 UNITS tablet Take 2,000 Units by mouth daily.     . Itraconazole 200 MG TABS Take 200 mg by mouth 2 (two) times daily. 56 tablet 0  . Omega-3 Fatty Acids (FISH OIL) 1000 MG CAPS Take 1 capsule by mouth daily.      [name] Kitchen terbinafine (LAMISIL) 250 MG tablet TAKE 2 TABLETS BY MOUTH TWICE DAILY 56 tablet 0  . valACYclovir (VALTREX) 1000 MG tablet Take 1 tablet (1,000 mg total) by mouth 2 (two) times daily. 20 tablet 0  . vitamin C (ASCORBIC ACID) 500 MG tablet Take 500 mg by mouth daily.       Facility-Administered Medications Prior to Visit  Medication Dose Route Frequency Provider Last Rate Last Dose  . methylPREDNISolone acetate (DEPO-MEDROL) injection 80 mg  80 mg Intramuscular Once Hulan Saas M, DO        ROS: Review of Systems  Constitutional: Negative.  Negative for activity change, appetite change, chills, diaphoresis, fatigue, fever and unexpected weight change.  HENT: Negative for congestion, ear pain, facial swelling, hearing loss, mouth sores, nosebleeds, postnasal drip, rhinorrhea, sinus pressure, sneezing, sore throat, tinnitus and trouble swallowing.   Eyes: Positive for visual disturbance. Negative for pain, discharge, redness and itching.  Respiratory: Negative for cough and stridor.   Cardiovascular: Negative for chest pain, palpitations and leg swelling.  Gastrointestinal: Negative for abdominal distention, anal bleeding, constipation, diarrhea, nausea and rectal  pain.  Genitourinary: Negative for urgency.  Musculoskeletal: Negative for arthralgias, back pain, gait problem, joint swelling, neck pain and neck stiffness.  Skin: Positive for color change and wound. Negative for rash.  Neurological: Negative for dizziness, tremors, seizures, syncope, speech difficulty, weakness, numbness and headaches.  Hematological: Negative for adenopathy. Does not bruise/bleed easily.  Psychiatric/Behavioral: Negative for behavioral problems, decreased concentration, dysphoric mood, sleep disturbance and suicidal ideas. The patient is not nervous/anxious.     Objective:  BP 114/66 (BP Location: Left Arm, Patient Position: Sitting, Cuff Size: Normal)   Pulse 69   Temp 97.7 F (36.5 C) (Oral)   Ht 5' 5.5" (1.664 m)   Wt 95 lb (43.1 kg)   SpO2 95%   BMI 15.57 kg/m   BP Readings from Last 3 Encounters:  09/15/18 114/66  08/29/18 100/70  08/25/18 106/68    Wt Readings from Last 3 Encounters:  09/15/18 95 lb (43.1 kg)  08/29/18 95 lb 12.8 oz (43.5 kg)  08/25/18 96 lb 1.3 oz (43.6 kg)    Physical Exam Constitutional:      General: She is not in acute distress.    Appearance: [name].  HENT:     Head: Normocephalic.     Right Ear: External ear normal.     Left Ear: External ear normal.     Nose: Nose normal.  Eyes:     General:        Right eye: No discharge.  Left eye: No discharge.     Conjunctiva/sclera: Conjunctivae normal.     Pupils: Pupils are equal, round, and reactive to light.  Neck:     Musculoskeletal: Normal range of motion and neck supple.     Thyroid: No thyromegaly.     Vascular: No JVD.     Trachea: No tracheal deviation.  Cardiovascular:     Rate and Rhythm: Normal rate and regular rhythm.     Heart sounds: Normal heart sounds.  Pulmonary:     Effort: No respiratory distress.     Breath sounds: No stridor. No wheezing.  Abdominal:     General: Bowel sounds are normal. There is no distension.      Palpations: Abdomen is soft. There is no mass.     Tenderness: There is no abdominal tenderness. There is no guarding or rebound.  Musculoskeletal:        General: No tenderness.  Lymphadenopathy:     Cervical: No cervical adenopathy.  Skin:    Findings: No erythema or rash.  Neurological:     Cranial Nerves: No cranial nerve deficit.     Motor: No abnormal muscle tone.     Coordination: Coordination normal.     Deep Tendon Reflexes: Reflexes normal.  Psychiatric:        Behavior: Behavior normal.        Thought Content: Thought content normal.        Judgment: Judgment normal.   neck - sensitive to ROM   R 4th tiny abscess w/callus 2 mm  Procedure note:  Incision and Drainage of an Abscess   Indication : a localized collection of pus that is tender and not spontaneously resolving.    Risks including unsuccessful procedure , possible need for a repeat procedure due to pus accumulation, scar formation, and others as well as benefits were explained to the patient in detail.Verbal consent was obtained/signed.     The area of the finger abscess was prepped with povidone-iodine. Callus was removed under magnification and the skin was incised 2 mm. A 1 mm thorn and drop of purulent material was expressed.The cavity was cleaned.   The wound was dressed with antibiotic ointment and a bandaid.  Tolerated well. Complications: None.   Wound instructions provided.    Please contact us if you notice a recollection of pus in the abscess fever and chills increased pain redness red streaks near the abscess increased swelling in the area.         Lab Results  Component Value Date   WBC 5.7 06/03/2018   HGB 14.4 06/03/2018   HCT 43.2 06/03/2018   PLT 270.0 06/03/2018   GLUCOSE 86 06/03/2018   CHOL 190 06/03/2018   TRIG 71.0 06/03/2018   HDL 83.40 06/03/2018   LDLDIRECT 107.6 08/15/2012   LDLCALC 93 06/03/2018   ALT 17 06/03/2018   AST 37 06/03/2018   NA 140 06/03/2018   K  4.8 06/03/2018   CL 101 06/03/2018   CREATININE 0.68 06/03/2018   BUN 14 06/03/2018   CO2 28 06/03/2018   TSH 2.68 06/03/2018    Mm 3d Screen Breast Bilateral  Result Date: 04/26/2018 CLINICAL DATA:  Screening. EXAM: DIGITAL SCREENING BILATERAL MAMMOGRAM WITH TOMO AND CAD COMPARISON:  Previous exam(s). ACR Breast Density Category d: The breast tissue is extremely dense, which lowers the sensitivity of mammography. FINDINGS: There are no findings suspicious for malignancy. Images were processed with CAD. IMPRESSION: No mammographic evidence of malignancy. A result letter  of this screening mammogram will be mailed directly to the patient. RECOMMENDATION: Screening mammogram in one year. (Code:SM-B-01Y) BI-RADS CATEGORY  1: Negative. Electronically Signed   By: Abelardo Diesel M.D.   On: 04/26/2018 12:33    Assessment & Plan:   There are no diagnoses linked to this encounter.   No orders of the defined types were placed in this encounter.    Follow-up: No follow-ups on file.  Walker Kehr, MD

## 2018-10-04 ENCOUNTER — Encounter: Payer: Self-pay | Admitting: Gynecology

## 2018-10-04 ENCOUNTER — Ambulatory Visit: Payer: PPO | Admitting: Gynecology

## 2018-10-04 VITALS — BP 122/76 | Ht 66.0 in | Wt 93.0 lb

## 2018-10-04 DIAGNOSIS — Z01419 Encounter for gynecological examination (general) (routine) without abnormal findings: Secondary | ICD-10-CM

## 2018-10-04 DIAGNOSIS — N952 Postmenopausal atrophic vaginitis: Secondary | ICD-10-CM

## 2018-10-04 DIAGNOSIS — M8580 Other specified disorders of bone density and structure, unspecified site: Secondary | ICD-10-CM

## 2018-10-04 NOTE — Patient Instructions (Signed)
Follow-up for the bone density as scheduled. 

## 2018-10-04 NOTE — Progress Notes (Signed)
    [name] 03-27-1946 [id number]        73 y.o.  G0P0 for breast and pelvic exam.  Doing well without complaints.  Past medical history,surgical history, problem list, medications, allergies, family history and social history were all reviewed and documented as reviewed in the EPIC chart.  ROS:  Performed with pertinent positives and negatives included in the history, assessment and plan.   Additional significant findings : None   Exam: Caryn Bee assistant Vitals:   10/04/18 1026  BP: 122/76  Weight: 93 lb (42.2 kg)  Height: 5\' 6"  (1.676 m)   Body mass index is 15.01 kg/m.  General appearance:  Normal affect, orientation and appearance. Skin: Grossly normal HEENT: Without gross lesions.  No cervical or supraclavicular adenopathy. Thyroid normal.  Lungs:  Clear without wheezing, rales or rhonchi Cardiac: RR, without RMG Abdominal:  Soft, nontender, without masses, guarding, rebound, organomegaly or hernia Breasts:  Examined lying and sitting without masses, retractions, discharge or axillary adenopathy. Pelvic:  Ext, BUS, Vagina: With atrophic changes  Cervix: With atrophic changes  Uterus: Anteverted, normal size, shape and contour, midline and mobile nontender   Adnexa: Without masses or tenderness    Anus and perineum: Normal   Rectovaginal: Normal sphincter tone without palpated masses or tenderness.    Assessment/Plan:  73 y.o. G0P0 female for breast and pelvic exam.   1. Postmenopausal/atrophic genital changes.  No significant menopausal symptoms or any vaginal bleeding. 2. Osteopenia.  DEXA 2018 T score -2 FRAX 27% / 17%.  We discussed treatment options based on increased fraction she declined treatment.  Recommend follow-up DEXA now at 2-year interval and she will schedule in follow-up for this. 3. Mammography 04/2018.  Continue with annual mammography when due.  Breast exam normal today. 4. Pap smear 2015.  No Pap smear done today.  No history of abnormal  Pap smears.  We both agree to stop screening per current screening guidelines based on age. 5. Colonoscopy 2016.  Repeat at their recommended interval. 6. Health maintenance.  No routine lab work done as patient does this elsewhere.  Follow-up 1 year, sooner as needed.   Anastasio Auerbach MD, 10:50 AM 10/04/2018

## 2018-10-13 ENCOUNTER — Ambulatory Visit (INDEPENDENT_AMBULATORY_CARE_PROVIDER_SITE_OTHER): Payer: PPO | Admitting: Orthopaedic Surgery

## 2018-10-17 DIAGNOSIS — H43811 Vitreous degeneration, right eye: Secondary | ICD-10-CM | POA: Diagnosis not present

## 2018-10-17 DIAGNOSIS — H5310 Unspecified subjective visual disturbances: Secondary | ICD-10-CM | POA: Diagnosis not present

## 2018-10-17 DIAGNOSIS — H25811 Combined forms of age-related cataract, right eye: Secondary | ICD-10-CM | POA: Diagnosis not present

## 2018-11-17 ENCOUNTER — Other Ambulatory Visit: Payer: Self-pay | Admitting: Gynecology

## 2018-11-17 ENCOUNTER — Ambulatory Visit (INDEPENDENT_AMBULATORY_CARE_PROVIDER_SITE_OTHER): Payer: PPO

## 2018-11-17 DIAGNOSIS — M8589 Other specified disorders of bone density and structure, multiple sites: Secondary | ICD-10-CM

## 2018-11-17 DIAGNOSIS — Z780 Asymptomatic menopausal state: Secondary | ICD-10-CM

## 2018-11-17 DIAGNOSIS — M8580 Other specified disorders of bone density and structure, unspecified site: Secondary | ICD-10-CM

## 2018-11-21 ENCOUNTER — Encounter: Payer: Self-pay | Admitting: Gynecology

## 2019-03-27 DIAGNOSIS — H43813 Vitreous degeneration, bilateral: Secondary | ICD-10-CM | POA: Diagnosis not present

## 2019-04-27 DIAGNOSIS — H43813 Vitreous degeneration, bilateral: Secondary | ICD-10-CM | POA: Diagnosis not present

## 2019-04-27 DIAGNOSIS — H02051 Trichiasis without entropian right upper eyelid: Secondary | ICD-10-CM | POA: Diagnosis not present

## 2019-05-09 ENCOUNTER — Other Ambulatory Visit: Payer: Self-pay | Admitting: Gynecology

## 2019-05-09 DIAGNOSIS — Z1231 Encounter for screening mammogram for malignant neoplasm of breast: Secondary | ICD-10-CM

## 2019-06-06 ENCOUNTER — Encounter: Payer: Self-pay | Admitting: Internal Medicine

## 2019-06-06 ENCOUNTER — Ambulatory Visit (INDEPENDENT_AMBULATORY_CARE_PROVIDER_SITE_OTHER): Payer: PPO | Admitting: Internal Medicine

## 2019-06-06 ENCOUNTER — Other Ambulatory Visit: Payer: Self-pay

## 2019-06-06 VITALS — BP 124/72 | HR 57 | Temp 97.8°F | Ht 66.0 in | Wt 91.0 lb

## 2019-06-06 DIAGNOSIS — M2550 Pain in unspecified joint: Secondary | ICD-10-CM | POA: Diagnosis not present

## 2019-06-06 DIAGNOSIS — Z23 Encounter for immunization: Secondary | ICD-10-CM | POA: Diagnosis not present

## 2019-06-06 DIAGNOSIS — Z0000 Encounter for general adult medical examination without abnormal findings: Secondary | ICD-10-CM

## 2019-06-06 DIAGNOSIS — M5440 Lumbago with sciatica, unspecified side: Secondary | ICD-10-CM

## 2019-06-06 NOTE — Patient Instructions (Addendum)
If you have medicare related insurance (such as traditional Medicare, Blue Cross Medicare, United HealthCare Medicare, or similar), Please make an appointment at the scheduling desk with [name], the Wellness Health Coach, for your Wellness visit in this office, which is a benefit with your insurance.    Cardiac CT calcium scoring test $150   Computed tomography, more commonly known as a CT or CAT scan, is a diagnostic medical imaging test. Like traditional x-rays, it produces multiple images or pictures of the inside of the body. The cross-sectional images generated during a CT scan can be reformatted in multiple planes. They can even generate three-dimensional images. These images can be viewed on a computer monitor, printed on film or by a 3D printer, or transferred to a CD or DVD. CT images of internal organs, bones, soft tissue and blood vessels provide greater detail than traditional x-rays, particularly of soft tissues and blood vessels. A cardiac CT scan for coronary calcium is a non-invasive way of obtaining information about the presence, location and extent of calcified plaque in the coronary arteries-the vessels that supply oxygen-containing blood to the heart muscle. Calcified plaque results when there is a build-up of fat and other substances under the inner layer of the artery. This material can calcify which signals the presence of atherosclerosis, a disease of the vessel wall, also called coronary artery disease (CAD). People with this disease have an increased risk for heart attacks. In addition, over time, progression of plaque build up (CAD) can narrow the arteries or even close off blood flow to the heart. The result may be chest pain, sometimes called "angina," or a heart attack. Because calcium is a marker of CAD, the amount of calcium detected on a cardiac CT scan is a helpful prognostic tool. The findings on cardiac CT are expressed as a calcium score. Another name for this test is  coronary artery calcium scoring.  What are some common uses of the procedure? The goal of cardiac CT scan for calcium scoring is to determine if CAD is present and to what extent, even if there are no symptoms. It is a screening study that may be recommended by a physician for patients with risk factors for CAD but no clinical symptoms. The major risk factors for CAD are: . high blood cholesterol levels  . family history of heart attacks  . diabetes  . high blood pressure  . cigarette smoking  . overweight or obese  . physical inactivity   A negative cardiac CT scan for calcium scoring shows no calcification within the coronary arteries. This suggests that CAD is absent or so minimal it cannot be seen by this technique. The chance of having a heart attack over the next two to five years is very low under these circumstances. A positive test means that CAD is present, regardless of whether or not the patient is experiencing any symptoms. The amount of calcification-expressed as the calcium score-may help to predict the likelihood of a myocardial infarction (heart attack) in the coming years and helps your medical doctor or cardiologist decide whether the patient may need to take preventive medicine or undertake other measures such as diet and exercise to lower the risk for heart attack. The extent of CAD is graded according to your calcium score:  Calcium Score  Presence of CAD (coronary artery disease)  0 No evidence of CAD   1-10 Minimal evidence of CAD  11-100 Mild evidence of CAD  101-400 Moderate evidence of CAD  Over   400 Extensive evidence of CAD    

## 2019-06-06 NOTE — Assessment & Plan Note (Addendum)
[employer]

## 2019-06-06 NOTE — Progress Notes (Signed)
Subjective:  Patient ID: [name], female    DOB: Aug 27, 1946  Age: 73 y.o. MRN: [id number]  CC: No chief complaint on file.   HPI [name] presents for a f/u C/o LBP  Outpatient Medications Prior to Visit  Medication Sig Dispense Refill  . cholecalciferol (VITAMIN D) 1000 UNITS tablet Take 2,000 Units by mouth daily.     . Omega-3 Fatty Acids (FISH OIL) 1000 MG CAPS Take 1 capsule by mouth daily.      . vitamin C (ASCORBIC ACID) 500 MG tablet Take 500 mg by mouth daily.       Facility-Administered Medications Prior to Visit  Medication Dose Route Frequency Provider Last Rate Last Dose  . methylPREDNISolone acetate (DEPO-MEDROL) injection 80 mg  80 mg Intramuscular Once Hulan Saas M, DO        ROS: Review of Systems  Constitutional: Negative for activity change, appetite change, chills, fatigue and unexpected weight change.  HENT: Negative for congestion, mouth sores and sinus pressure.   Eyes: Negative for visual disturbance.  Respiratory: Negative for cough and chest tightness.   Gastrointestinal: Negative for abdominal pain and nausea.  Genitourinary: Negative for difficulty urinating, frequency and vaginal pain.  Musculoskeletal: Positive for back pain. Negative for gait problem.  Skin: Negative for pallor and rash.  Neurological: Negative for dizziness, tremors, weakness, numbness and headaches.  Psychiatric/Behavioral: Negative for confusion, sleep disturbance and suicidal ideas. The patient is nervous/anxious.     Objective:  BP 124/72 (BP Location: Left Arm, Patient Position: Sitting, Cuff Size: Normal)   Pulse (!) 57   Temp 97.8 F (36.6 C) (Oral)   Ht 5\' 6"  (1.676 m)   Wt 91 lb (41.3 kg)   SpO2 98%   BMI 14.69 kg/m   BP Readings from Last 3 Encounters:  06/06/19 124/72  10/04/18 122/76  09/15/18 114/66    Wt Readings from Last 3 Encounters:  06/06/19 91 lb (41.3 kg)  10/04/18 93 lb (42.2 kg)  09/15/18 95 lb (43.1 kg)    Physical  Exam Constitutional:      General: She is not in acute distress.    Appearance: [name].  HENT:     Head: Normocephalic.     Right Ear: External ear normal.     Left Ear: External ear normal.     Nose: Nose normal.  Eyes:     General:        Right eye: No discharge.        Left eye: No discharge.     Conjunctiva/sclera: Conjunctivae normal.     Pupils: Pupils are equal, round, and reactive to light.  Neck:     Musculoskeletal: Normal range of motion and neck supple.     Thyroid: No thyromegaly.     Vascular: No JVD.     Trachea: No tracheal deviation.  Cardiovascular:     Rate and Rhythm: Normal rate and regular rhythm.     Heart sounds: Normal heart sounds.  Pulmonary:     Effort: No respiratory distress.     Breath sounds: No stridor. No wheezing.  Abdominal:     General: Bowel sounds are normal. There is no distension.     Palpations: Abdomen is soft. There is no mass.     Tenderness: There is no abdominal tenderness. There is no guarding or rebound.  Musculoskeletal:        General: No tenderness.  Lymphadenopathy:     Cervical: No cervical adenopathy.  Skin:    Findings: No erythema or rash.  Neurological:     Cranial Nerves: No cranial nerve deficit.     Motor: No abnormal muscle tone.     Coordination: Coordination normal.     Deep Tendon Reflexes: Reflexes normal.  Psychiatric:        Behavior: Behavior normal.        Thought Content: Thought content normal.        Judgment: Judgment normal.    LS tender Thin  FTF>20 min  Lab Results  Component Value Date   WBC 5.7 06/03/2018   HGB 14.4 06/03/2018   HCT 43.2 06/03/2018   PLT 270.0 06/03/2018   GLUCOSE 86 06/03/2018   CHOL 190 06/03/2018   TRIG 71.0 06/03/2018   HDL 83.40 06/03/2018   LDLDIRECT 107.6 08/15/2012   LDLCALC 93 06/03/2018   ALT 17 06/03/2018   AST 37 06/03/2018   NA 140 06/03/2018   K 4.8 06/03/2018   CL 101 06/03/2018   CREATININE 0.68 06/03/2018   BUN 14  06/03/2018   CO2 28 06/03/2018   TSH 2.68 06/03/2018    Mm 3d Screen Breast Bilateral  Result Date: 04/26/2018 CLINICAL DATA:  Screening. EXAM: DIGITAL SCREENING BILATERAL MAMMOGRAM WITH TOMO AND CAD COMPARISON:  Previous exam(s). ACR Breast Density Category d: The breast tissue is extremely dense, which lowers the sensitivity of mammography. FINDINGS: There are no findings suspicious for malignancy. Images were processed with CAD. IMPRESSION: No mammographic evidence of malignancy. A result letter of this screening mammogram will be mailed directly to the patient. RECOMMENDATION: Screening mammogram in one year. (Code:SM-B-01Y) BI-RADS CATEGORY  1: Negative. Electronically Signed   By: Abelardo Diesel M.D.   On: 04/26/2018 12:33    Assessment & Plan:   Diagnoses and all orders for this visit:  Need for influenza vaccination -     Flu Vaccine QUAD High Dose(Fluad)     No orders of the defined types were placed in this encounter.    Follow-up: No follow-ups on file.  Walker Kehr, MD

## 2019-06-06 NOTE — Assessment & Plan Note (Signed)
[facility] Pt declined X ray

## 2019-06-08 ENCOUNTER — Other Ambulatory Visit (INDEPENDENT_AMBULATORY_CARE_PROVIDER_SITE_OTHER): Payer: PPO

## 2019-06-08 DIAGNOSIS — M2550 Pain in unspecified joint: Secondary | ICD-10-CM

## 2019-06-08 DIAGNOSIS — Z0000 Encounter for general adult medical examination without abnormal findings: Secondary | ICD-10-CM | POA: Diagnosis not present

## 2019-06-08 LAB — BASIC METABOLIC PANEL
BUN: 12 mg/dL (ref 6–23)
CO2: 29 mEq/L (ref 19–32)
Calcium: 9.8 mg/dL (ref 8.4–10.5)
Chloride: 100 mEq/L (ref 96–112)
Creatinine, Ser: 0.68 mg/dL (ref 0.40–1.20)
GFR: 84.66 mL/min (ref >60.00)
Glucose, Bld: 90 mg/dL (ref 70–99)
Potassium: 4.1 mEq/L (ref 3.5–5.1)
Sodium: 137 mEq/L (ref 135–145)

## 2019-06-08 LAB — CBC WITH DIFFERENTIAL/PLATELET
Basophils Absolute: 0 10*3/uL (ref 0.0–0.1)
Basophils Relative: 0.7 % (ref 0.0–3.0)
Eosinophils Absolute: 0.1 10*3/uL (ref 0.0–0.7)
Eosinophils Relative: 2.4 % (ref 0.0–5.0)
HCT: 43.9 % (ref 36.0–46.0)
Hemoglobin: 14.3 g/dL (ref 12.0–15.0)
Lymphocytes Relative: 30.3 % (ref 12.0–46.0)
Lymphs Abs: 1.8 10*3/uL (ref 0.7–4.0)
MCHC: 32.5 g/dL (ref 30.0–36.0)
MCV: 95.6 fl (ref 78.0–100.0)
Monocytes Absolute: 0.7 10*3/uL (ref 0.1–1.0)
Monocytes Relative: 11.2 % (ref 3.0–12.0)
Neutro Abs: 3.3 10*3/uL (ref 1.4–7.7)
Neutrophils Relative %: 55.4 % (ref 43.0–77.0)
Platelets: 260 10*3/uL (ref 150.0–400.0)
RBC: 4.59 Mil/uL (ref 3.87–5.11)
RDW: 12.5 % (ref 11.5–15.5)
WBC: 5.9 10*3/uL (ref 4.0–10.5)

## 2019-06-08 LAB — LIPID PANEL
Cholesterol: 195 mg/dL (ref 0–200)
HDL: 88.6 mg/dL (ref >39.00)
LDL Cholesterol: 93 mg/dL (ref 0–99)
NonHDL: 106.28
Total CHOL/HDL Ratio: 2
Triglycerides: 68 mg/dL (ref 0.0–149.0)
VLDL: 13.6 mg/dL (ref 0.0–40.0)

## 2019-06-08 LAB — URINALYSIS
Bilirubin Urine: NEGATIVE
Ketones, ur: NEGATIVE
Leukocytes,Ua: NEGATIVE
Nitrite: NEGATIVE
Specific Gravity, Urine: <=1.005 — AB (ref 1.000–1.030)
Total Protein, Urine: NEGATIVE
Urine Glucose: NEGATIVE
Urobilinogen, UA: 0.2 (ref 0.0–1.0)
pH: 6 (ref 5.0–8.0)

## 2019-06-08 LAB — HEPATIC FUNCTION PANEL
ALT: 20 U/L (ref 0–35)
AST: 41 U/L — ABNORMAL HIGH (ref 0–37)
Albumin: 4.7 g/dL (ref 3.5–5.2)
Alkaline Phosphatase: 80 U/L (ref 39–117)
Bilirubin, Direct: 0.1 mg/dL (ref 0.0–0.3)
Total Bilirubin: 0.8 mg/dL (ref 0.2–1.2)
Total Protein: 7.2 g/dL (ref 6.0–8.3)

## 2019-06-08 LAB — TSH: TSH: 3.47 u[IU]/mL (ref 0.35–4.50)

## 2019-06-13 ENCOUNTER — Other Ambulatory Visit: Payer: Self-pay | Admitting: Internal Medicine

## 2019-06-13 DIAGNOSIS — E785 Hyperlipidemia, unspecified: Secondary | ICD-10-CM

## 2019-06-15 DIAGNOSIS — L814 Other melanin hyperpigmentation: Secondary | ICD-10-CM | POA: Diagnosis not present

## 2019-06-15 DIAGNOSIS — Z85828 Personal history of other malignant neoplasm of skin: Secondary | ICD-10-CM | POA: Diagnosis not present

## 2019-06-15 DIAGNOSIS — D1801 Hemangioma of skin and subcutaneous tissue: Secondary | ICD-10-CM | POA: Diagnosis not present

## 2019-06-15 DIAGNOSIS — L821 Other seborrheic keratosis: Secondary | ICD-10-CM | POA: Diagnosis not present

## 2019-06-15 DIAGNOSIS — D2261 Melanocytic nevi of right upper limb, including shoulder: Secondary | ICD-10-CM | POA: Diagnosis not present

## 2019-06-18 ENCOUNTER — Encounter: Payer: Self-pay | Admitting: Internal Medicine

## 2019-06-22 ENCOUNTER — Other Ambulatory Visit: Payer: Self-pay

## 2019-06-22 ENCOUNTER — Ambulatory Visit
Admission: RE | Admit: 2019-06-22 | Discharge: 2019-06-22 | Disposition: A | Discharge status: Home / Self Care | Payer: PPO | Source: Ambulatory Visit | Attending: Gynecology | Admitting: Gynecology

## 2019-06-22 DIAGNOSIS — Z1231 Encounter for screening mammogram for malignant neoplasm of breast: Secondary | ICD-10-CM

## 2019-06-28 ENCOUNTER — Encounter: Payer: Self-pay | Admitting: *Deleted

## 2019-06-28 ENCOUNTER — Encounter: Payer: Self-pay | Admitting: Gynecology

## 2019-07-21 ENCOUNTER — Ambulatory Visit (INDEPENDENT_AMBULATORY_CARE_PROVIDER_SITE_OTHER): Payer: PPO | Admitting: *Deleted

## 2019-07-21 DIAGNOSIS — Z0000 Encounter for general adult medical examination without abnormal findings: Secondary | ICD-10-CM

## 2019-07-21 NOTE — Progress Notes (Signed)
Medical screening examination/treatment/procedure(s) were performed by non-physician practitioner and as supervising physician I was immediately available for consultation/collaboration. I agree with above. Tahir Blank A Heyli Min, MD 

## 2019-07-21 NOTE — Progress Notes (Signed)
Subjective:   [name] is a 73 y.o. female who presents for Medicare Annual (Subsequent) preventive examination. I connected with patient by a telephone and verified that I am speaking with the correct person using two identifiers. Patient stated full name and DOB. Patient gave permission to continue with telephonic visit. Patient's location was at home and Nurse's location was at [place] office. Participants during this visit included patient and nurse.  Review of Systems:   Cardiac Risk Factors include: advanced age (>80men, >65 women) Sleep patterns: feels rested on waking, gets up 0 times nightly to void and sleeps 7-8 hours nightly.    Home Safety/Smoke Alarms: Feels safe in home. Smoke alarms in place.  Living environment; residence and Firearm Safety: [place], can live on one level. Lives with husband, no needs for DME, limited support system Seat Belt Safety/Bike Helmet: Wears seat belt.     Objective:     Vitals: There were no vitals taken for this visit.  There is no height or weight on file to calculate BMI.  Advanced Directives 07/21/2019 11/24/2015  Does Patient Have a Medical Advance Directive? No;Yes Yes  Type of Paramedic of [place];Living will -  Copy of [place] in Chart? No - copy requested -    Tobacco Social History   Tobacco Use  Smoking Status Never Smoker  Smokeless Tobacco Never Used     Counseling given: Not Answered  Past Medical History:  Diagnosis Date  . Adenomatous colon polyp   . Atrophic vaginitis   . Benign neoplasm of colon 01/18/2008  . DIVERTICULOSIS, MILD 10/26/2007   colonoscopy 10/26/07  . H pylori ulcer   . HEMORRHOIDS, INTERNAL 01/18/2008  . OSTEOPENIA 10/2018   T score 2.1 FRAX 14% / 6%  . Skin disorder    NOT PARAPSORIASIS--ETIOLOGY UNCLEAR  . Ulcer 01/31/06   STOMACH ULCER   Past Surgical History:  Procedure Laterality Date  . APPENDECTOMY  1963  . COLONOSCOPY W/  BIOPSIES     multiple  . ESOPHAGOGASTRODUODENOSCOPY  03/30/2006  . GANGLION CYST EXCISION  1972   Right  . Gastric Ulcer and GI Bleed (hospitalization)  5/07  . LYMPH NODE BIOPSY  2001   Benign from the groin-Inflammatory changes due to her skin condition.  . s/p eradication of H. Pylori     confirmed by urea breath teast  . SKIN CANCER EXCISION     removed from the forehead  . TONSILLECTOMY  1955  . TUBAL LIGATION     Family History  Problem Relation Age of Onset  . Breast cancer Mother        Age 61's  . Heart disease Mother   . Heart disease Father   . Breast cancer Maternal Aunt        Age 32's  . Heart disease Maternal Grandmother   . Colon cancer Maternal Grandfather   . Breast cancer Cousin        maternal  . Colon cancer Cousin   . Colon cancer Maternal Uncle   . Breast cancer Cousin   . Breast cancer Cousin    Social History   Socioeconomic History  . Marital status: Married    Spouse name: Not on file  . Number of children: Not on file  . Years of education: Not on file  . Highest education level: Not on file  Occupational History  . Occupation: Optometrist  Social Needs  . Financial resource strain: Not hard  at all  . Food insecurity    Worry: Never true    Inability: Never true  . Transportation needs    Medical: No    Non-medical: No  Tobacco Use  . Smoking status: Never Smoker  . Smokeless tobacco: Never Used  Substance and Sexual Activity  . Alcohol use: Yes    Alcohol/week: 7.0 standard drinks    Types: 7 Glasses of Shayne Deerman per week  . Drug use: No  . Sexual activity: Not Currently    Birth control/protection: Surgical, Post-menopausal    Comment: BTL-1st intercourse 73 yo-Fewer than 5 partners  Lifestyle  . Physical activity    Days per week: 7 days    Minutes per session: 40 min  . Stress: Not at all  Relationships  . Social connections    Talks on phone: More than three times a week    Gets together: More than three times a week     Attends religious service: Not on file    Active member of club or organization: Not on file    Attends meetings of clubs or organizations: Not on file    Relationship status: Married  Other Topics Concern  . Not on file  Social History Narrative   Married without children. 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 3 coffees daily.   Regular exercise-yes   01/09/2015    Outpatient Encounter Medications as of 07/21/2019  Medication Sig  . cholecalciferol (VITAMIN D) 1000 UNITS tablet Take 2,000 Units by mouth daily.   . Omega-3 Fatty Acids (FISH OIL) 1000 MG CAPS Take 1 capsule by mouth daily.    . vitamin C (ASCORBIC ACID) 500 MG tablet Take 500 mg by mouth daily.     Facility-Administered Encounter Medications as of 07/21/2019  Medication  . methylPREDNISolone acetate (DEPO-MEDROL) injection 80 mg    Activities of Daily Living In your present state of health, do you have any difficulty performing the following activities: 07/21/2019  Hearing? N  Vision? N  Difficulty concentrating or making decisions? N  Walking or climbing stairs? N  Dressing or bathing? N  Doing errands, shopping? N  Preparing Food and eating ? N  Using the Toilet? N  In the past six months, have you accidently leaked urine? N  Do you have problems with loss of bowel control? N  Managing your Medications? N  Managing your Finances? N  Housekeeping or managing your Housekeeping? N  Some recent data might be hidden    Patient Care Team: Plotnikov, Evie Lacks, MD as PCP - General Fontaine, Belinda Block, MD as Consulting Physician (Gynecology) Gatha Mayer, MD as Consulting Physician (Gastroenterology) Rolm Bookbinder, MD as Consulting Physician (Dermatology)    Assessment:   This is a routine wellness examination for [facility]. Physical assessment deferred to PCP.   Exercise Activities and Dietary recommendations Current Exercise Habits: Home exercise routine, Type of exercise: walking(PT  exercises), Time (Minutes): 40, Frequency (Times/Week): 6, Weekly Exercise (Minutes/Week): 240, Exercise limited by: None identified  Diet (meal preparation, eat out, water intake, caffeinated beverages, dairy products, fruits and vegetables): in general, a "healthy" diet  , well balanced. eats a variety of fruits and vegetables daily, limits salt, fat/cholesterol, sugar,carbohydrates,caffeine, drinks 6-8 glasses of water daily.   Goals   None     Fall Risk Fall Risk  07/21/2019 06/01/2018 05/27/2017 10/28/2015  Falls in the past year? 0 No No No  Number falls in past yr: 0 - - -  Injury with  Fall? 0 - - -  Follow up Falls prevention discussed - - -   Is the patient's home free of loose throw rugs in walkways, pet beds, electrical cords, etc?   yes      Grab bars in the bathroom? no      Handrails on the stairs?   yes      Adequate lighting?   yes  Depression Screen PHQ 2/9 Scores 07/21/2019 06/01/2018 05/27/2017 10/28/2015  PHQ - 2 Score 1 0 0 0     Cognitive Function       Ad8 score reviewed for issues:  Issues making decisions: no  Less interest in hobbies / activities: no  Repeats questions, stories (family complaining): no  Trouble using ordinary gadgets (microwave, computer, phone):no  Forgets the month or year: no  Mismanaging finances: no  Remembering appts: no  Daily problems with thinking and/or memory: no Ad8 score is= 0  Immunization History  Administered Date(s) Administered  . Fluad Quad(high Dose 65+) 06/06/2019  . Influenza Split 07/16/2011, 07/05/2012  . Influenza, High Dose Seasonal PF 06/16/2016, 06/29/2017, 06/27/2018  . Influenza,inj,Quad PF,6+ Mos 06/20/2013, 06/21/2014, 06/25/2015  . Pneumococcal Conjugate-13 08/14/2013  . Pneumococcal Polysaccharide-23 05/07/2011  . Tdap 05/07/2011  . Zoster 02/16/2008   Screening Tests Health Maintenance  Topic Date Due  . PAP SMEAR-Modifier  08/04/2015  . COLONOSCOPY  02/06/2020  . TETANUS/TDAP   05/06/2021  . MAMMOGRAM  06/21/2021  . INFLUENZA VACCINE  Completed  . DEXA SCAN  Completed  . Hepatitis C Screening  Completed  . PNA vac Low Risk Adult  Completed      Plan:     Reviewed health maintenance screenings with patient today and relevant education, vaccines, and/or referrals were provided.   Continue to eat heart healthy diet (full of fruits, vegetables, whole grains, lean protein, water--limit salt, fat, and sugar intake) and increase physical activity as tolerated.  Continue doing brain stimulating activities (puzzles, reading, adult coloring books, staying active) to keep memory sharp.   I have personally reviewed and noted the following in the patient's chart:   . Medical and social history . Use of alcohol, tobacco or illicit drugs  . Current medications and supplements . Functional ability and status . Nutritional status . Physical activity . Advanced directives . List of other physicians . Screenings to include cognitive, depression, and falls . Referrals and appointments  In addition, I have reviewed and discussed with patient certain preventive protocols, quality metrics, and best practice recommendations. A written personalized care plan for preventive services as well as general preventive health recommendations were provided to patient.     Michiel Cowboy, RN  07/21/2019

## 2019-07-31 ENCOUNTER — Other Ambulatory Visit: Payer: Self-pay

## 2019-07-31 ENCOUNTER — Encounter: Payer: Self-pay | Admitting: Internal Medicine

## 2019-07-31 ENCOUNTER — Ambulatory Visit (INDEPENDENT_AMBULATORY_CARE_PROVIDER_SITE_OTHER)
Admission: RE | Admit: 2019-07-31 | Discharge: 2019-07-31 | Disposition: A | Discharge status: Home / Self Care | Payer: Self-pay | Source: Ambulatory Visit | Attending: Internal Medicine | Admitting: Internal Medicine

## 2019-07-31 DIAGNOSIS — E785 Hyperlipidemia, unspecified: Secondary | ICD-10-CM

## 2019-07-31 DIAGNOSIS — I2510 Atherosclerotic heart disease of native coronary artery without angina pectoris: Secondary | ICD-10-CM | POA: Insufficient documentation

## 2019-10-01 ENCOUNTER — Other Ambulatory Visit: Payer: Self-pay

## 2019-10-01 ENCOUNTER — Ambulatory Visit (HOSPITAL_COMMUNITY)
Admission: EM | Admit: 2019-10-01 | Discharge: 2019-10-01 | Disposition: A | Discharge status: Home / Self Care | Payer: PPO | Attending: Family Medicine | Admitting: Family Medicine

## 2019-10-01 ENCOUNTER — Ambulatory Visit (INDEPENDENT_AMBULATORY_CARE_PROVIDER_SITE_OTHER): Payer: PPO

## 2019-10-01 ENCOUNTER — Encounter (HOSPITAL_COMMUNITY): Payer: Self-pay | Admitting: Emergency Medicine

## 2019-10-01 DIAGNOSIS — M7989 Other specified soft tissue disorders: Secondary | ICD-10-CM | POA: Diagnosis not present

## 2019-10-01 DIAGNOSIS — M25572 Pain in left ankle and joints of left foot: Secondary | ICD-10-CM

## 2019-10-01 NOTE — Discharge Instructions (Signed)
Your x ray was normal This may be some sort of arthritis flare. Do not believe this is infection.  You  can do 600 mg of ibuprofen every 8 hours Rest, ice, elevate If symptoms worsen please let us know.

## 2019-10-01 NOTE — ED Triage Notes (Signed)
Pt states she felt a "tinge of something" in her left medial ankle when walking yesterday.  She states when she woke up this morning it was swollen, red, and hot to the touch.  This has since subsided but she is having constant pain to the ankle.

## 2019-10-01 NOTE — ED Provider Notes (Signed)
[place]    CSN: [id number] Arrival date & time: 10/01/19  1316      History   Chief Complaint Chief Complaint  Patient presents with  . Ankle Pain    Appt 1330 left    HPI [name] is a 74 y.o. female.   Patient is a 74 year old female who presents today with sudden onset of left ankle pain, swelling, redness, increased warmth that started this morning.  Location is the medial ankle.  Started feeling slight pain during her walk yesterday.  She walks 3 miles a day.  She has suffered from stress fractures to the dorsum of the foot but this is different.  Pain is worse with movement and walking.  She is able to bear weight.  Denies any specific injuries or twisting of the ankle.  No fevers.  No history of gout.  ROS per HPI      Past Medical History:  Diagnosis Date  . Adenomatous colon polyp   . Atrophic vaginitis   . Benign neoplasm of colon 01/18/2008  . DIVERTICULOSIS, MILD 10/26/2007   colonoscopy 10/26/07  . H pylori ulcer   . HEMORRHOIDS, INTERNAL 01/18/2008  . OSTEOPENIA 10/2018   T score 2.1 FRAX 14% / 6%  . Skin disorder    NOT PARAPSORIASIS--ETIOLOGY UNCLEAR  . Ulcer 01/31/06   STOMACH ULCER    Patient Active Problem List   Diagnosis Date Noted  . Coronary atherosclerosis 07/31/2019  . Abscess of finger 09/15/2018  . Vision changes 09/15/2018  . Pain in right hand 09/08/2018  . Swelling of finger of right hand 08/29/2018  . Neoplasm of uncertain behavior of skin 06/01/2018  . Fluttering heart 05/27/2017  . Diarrhea 05/27/2017  . Cervical adenopathy 05/27/2017  . Right Achilles tendinitis 08/04/2016  . Well adult exam 04/27/2016  . Neck pain on left side 04/27/2016  . Lightheadedness 12/02/2015  . Metatarsal stress fracture of left foot 04/26/2015  . Arthralgia 10/23/2014  . Degenerative disc disease, lumbar 12/14/2013  . Acute low back pain 12/09/2013  . Piriformis syndrome of left side 08/07/2013  . Cough 10/04/2012  .  Atrophic vaginitis   . Skin disorder   . Ulcer   . PNEUMONIA 02/07/2010  . FEVER UNSPECIFIED 02/07/2010  . SINUSITIS, ACUTE 07/31/2008  . LOW BACK PAIN 07/03/2008  . Chronic fatigue disorder 07/03/2008  . UPPER RESPIRATORY INFECTION (URI) 02/21/2008  . Benign neoplasm of colon 01/18/2008  . HEMORRHOIDS, INTERNAL 01/18/2008  . Osteopenia 01/18/2008  . Headache 01/18/2008  . CYSTITIS 12/14/2007  . PARESTHESIA 12/14/2007  . DIVERTICULOSIS, MILD 10/26/2007  . PERSONAL HISTORY OF PEPTIC ULCER DISEASE 10/26/2007  . GASTRITIS 06/10/2007  . COLONIC POLYPS, HX OF 06/10/2007    Past Surgical History:  Procedure Laterality Date  . APPENDECTOMY  1963  . COLONOSCOPY W/ BIOPSIES     multiple  . ESOPHAGOGASTRODUODENOSCOPY  03/30/2006  . GANGLION CYST EXCISION  1972   Right  . Gastric Ulcer and GI Bleed (hospitalization)  5/07  . LYMPH NODE BIOPSY  2001   Benign from the groin-Inflammatory changes due to her skin condition.  . s/p eradication of H. Pylori     confirmed by urea breath teast  . SKIN CANCER EXCISION     removed from the forehead  . TONSILLECTOMY  1955  . TUBAL LIGATION      OB History    Gravida  0   Para      Term      Preterm  AB      Living        SAB      TAB      Ectopic      Multiple      Live Births               Home Medications    Prior to Admission medications   Medication Sig Start Date End Date Taking? Authorizing Provider  cholecalciferol (VITAMIN D) 1000 UNITS tablet Take 2,000 Units by mouth daily.    Yes [provider]  Omega-3 Fatty Acids (FISH OIL) 1000 MG CAPS Take 1 capsule by mouth daily.     Yes [provider]  vitamin C (ASCORBIC ACID) 500 MG tablet Take 500 mg by mouth daily.     Yes [provider]    Family History Family History  Problem Relation Age of Onset  . Breast cancer Mother        Age 78's  . Heart disease Mother   . Heart disease Father   . Breast cancer Maternal  Aunt        Age 76's  . Heart disease Maternal Grandmother   . Colon cancer Maternal Grandfather   . Breast cancer Cousin        maternal  . Colon cancer Cousin   . Colon cancer Maternal Uncle   . Breast cancer Cousin   . Breast cancer Cousin     Social History Social History   Tobacco Use  . Smoking status: Never Smoker  . Smokeless tobacco: Never Used  Substance Use Topics  . Alcohol use: Yes    Alcohol/week: 7.0 standard drinks    Types: 7 Glasses of wine per week  . Drug use: No     Allergies   Fosamax [alendronate sodium]   Review of Systems Review of Systems   Physical Exam Triage Vital Signs ED Triage Vitals  Enc Vitals Group     BP 10/01/19 1408 135/73     Pulse Rate 10/01/19 1408 74     Resp 10/01/19 1408 16     Temp 10/01/19 1408 98.5 F (36.9 C)     Temp Source 10/01/19 1408 Oral     SpO2 10/01/19 1408 97 %     Weight --      Height --      Head Circumference --      Peak Flow --      Pain Score 10/01/19 1406 6     Pain Loc --      Pain Edu? --      Excl. in [place]? --    No data found.  Updated Vital Signs BP 135/73 (BP Location: Left Arm)   Pulse 74   Temp 98.5 F (36.9 C) (Oral)   Resp 16   SpO2 97%   Visual Acuity Right Eye Distance:   Left Eye Distance:   Bilateral Distance:    Right Eye Near:   Left Eye Near:    Bilateral Near:     Physical Exam Vitals and nursing note reviewed.  Constitutional:      General: She is not in acute distress.    Appearance: Normal appearance. She is not ill-appearing, toxic-appearing or diaphoretic.  HENT:     Head: Normocephalic and atraumatic.     Nose: Nose normal.  Eyes:     Conjunctiva/sclera: Conjunctivae normal.  Pulmonary:     Effort: Pulmonary effort is normal.  Musculoskeletal:     Cervical back:  Normal range of motion.     Left ankle: Swelling present. No deformity, ecchymosis or lacerations. Tenderness present over the medial malleolus. Decreased range of motion. Normal pulse.   Skin:    General: Skin is warm and dry.  Neurological:     Mental Status: She is alert.      UC Treatments / Results  Labs (all labs ordered are listed, but only abnormal results are displayed) Labs Reviewed - No data to display  EKG   Radiology DG Ankle Complete Left  Result Date: 10/01/2019 CLINICAL DATA:  Ankle pain and swelling. EXAM: LEFT ANKLE COMPLETE - 3+ VIEW COMPARISON:  None. FINDINGS: There is no evidence of fracture, dislocation, or joint effusion. There is no evidence of arthropathy or other focal bone abnormality. Soft tissues are unremarkable. IMPRESSION: Negative. Electronically Signed   By: Constance Holster M.D.   On: 10/01/2019 14:51    Procedures Procedures (including critical care time)  Medications Ordered in UC Medications - No data to display  Initial Impression / Assessment and Plan / UC Course  I have reviewed the triage vital signs and the nursing notes.  Pertinent labs & imaging results that were available during my care of the patient were reviewed by me and considered in my medical decision making (see chart for details).     Ankle pain- x ray normal RICE Ibuprofen every 8 hours Does not appear to be infection.  No hx of gout Final Clinical Impressions(s) / UC Diagnoses   Final diagnoses:  Acute left ankle pain     Discharge Instructions     Your x ray was normal This may be some sort of arthritis flare. Do not believe this is infection.  You  can do 600 mg of ibuprofen every 8 hours Rest, ice, elevate If symptoms worsen please let us know.    ED Prescriptions    None     PDMP not reviewed this encounter.   Orvan July, NP 10/02/19 1747

## 2019-10-10 ENCOUNTER — Encounter: Payer: PPO | Admitting: Gynecology

## 2019-10-16 ENCOUNTER — Ambulatory Visit: Payer: PPO | Attending: Internal Medicine

## 2019-10-16 DIAGNOSIS — Z23 Encounter for immunization: Secondary | ICD-10-CM | POA: Insufficient documentation

## 2019-10-16 NOTE — Progress Notes (Signed)
   Covid-19 Vaccination Clinic  Name:  [name]    MRN: [id number] DOB: 09/26/1945  10/16/2019  [name] was observed post Covid-19 immunization for 15 minutes without incidence. She was provided with Vaccine Information Sheet and instruction to access the V-Safe system.   [name] was instructed to call 911 with any severe reactions post vaccine: [name] Kitchen Difficulty breathing  . Swelling of your face and throat  . A fast heartbeat  . A bad rash all over your body  . Dizziness and weakness    Immunizations Administered    Name Date Dose VIS Date Route   Pfizer COVID-19 Vaccine 10/16/2019  9:02 AM 0.3 mL 09/01/2019 Intramuscular   Manufacturer: [place]   Lot: [id number]   [place]: [id number]

## 2019-10-23 ENCOUNTER — Encounter: Payer: PPO | Admitting: Obstetrics and Gynecology

## 2019-10-25 ENCOUNTER — Other Ambulatory Visit: Payer: Self-pay

## 2019-10-25 ENCOUNTER — Encounter: Payer: Self-pay | Admitting: Obstetrics and Gynecology

## 2019-10-25 ENCOUNTER — Ambulatory Visit (INDEPENDENT_AMBULATORY_CARE_PROVIDER_SITE_OTHER): Payer: PPO | Admitting: Obstetrics and Gynecology

## 2019-10-25 DIAGNOSIS — M8588 Other specified disorders of bone density and structure, other site: Secondary | ICD-10-CM | POA: Diagnosis not present

## 2019-10-25 DIAGNOSIS — Z01419 Encounter for gynecological examination (general) (routine) without abnormal findings: Secondary | ICD-10-CM | POA: Diagnosis not present

## 2019-10-25 NOTE — Progress Notes (Signed)
   [name] 1945/12/15 [id number]  SUBJECTIVE:  74 y.o. G0P0 female for annual routine gynecologic exam. She has no gynecologic concerns.  Current Outpatient Medications  Medication Sig Dispense Refill  . cholecalciferol (VITAMIN D) 1000 UNITS tablet Take 2,000 Units by mouth daily.     . Multiple Vitamin (MULTIVITAMIN) tablet Take 1 tablet by mouth daily.    . Omega-3 Fatty Acids (FISH OIL) 1000 MG CAPS Take 1 capsule by mouth daily.      . vitamin C (ASCORBIC ACID) 500 MG tablet Take 500 mg by mouth daily.       Current Facility-Administered Medications  Medication Dose Route Frequency Provider Last Rate Last Admin  . methylPREDNISolone acetate (DEPO-MEDROL) injection 80 mg  80 mg Intramuscular Once Hulan Saas M, DO       Allergies: Fosamax [alendronate sodium]  No LMP recorded. Patient is postmenopausal.  Past medical history,surgical history, problem list, medications, allergies, family history and social history were all reviewed and documented as reviewed in the EPIC chart.  ROS:  Feeling well. No dyspnea or chest pain on exertion.  No abdominal pain, change in bowel habits, black or bloody stools.  No urinary tract symptoms. GYN ROS:  no abnormal bleeding, pelvic pain or discharge, no breast pain or new or enlarging lumps on self exam. No neurological complaints.  OBJECTIVE:  BP 116/76   Ht 5\' 6"  (1.676 m)   Wt 94 lb (42.6 kg)   BMI 15.17 kg/m  The patient appears well, alert, oriented x 3, in no distress. ENT normal.  Neck supple. No cervical or supraclavicular adenopathy or thyromegaly.  Lungs are clear, good air entry, no wheezes, rhonchi or rales. S1 and S2 normal, no murmurs, regular rate and rhythm.  Abdomen soft without tenderness, guarding, mass or organomegaly.  Neurological is normal, no focal findings.  BREAST EXAM: breasts appear normal, no suspicious masses, no skin or nipple changes or axillary nodes  PELVIC EXAM: VULVA: normal appearing vulva with  no masses, tenderness or lesions, atrophic changes, VAGINA: normal appearing vagina with normal color and discharge, no lesions, CERVIX: normal appearing cervix without discharge or lesions, UTERUS: uterus is normal size, shape, consistency and nontender, ADNEXA: normal adnexa in size, nontender and no masses  Chaperone: Caryn Bee present during the examination  ASSESSMENT:  75 y.o. G0P0 here for annual gynecologic exam  PLAN:   1.  No gynecologic concerns at this time 2. Pap smear not repeated today.  She is comfortable with discontinuing screening based on age criteria and no significantly abnormal Pap smear history. 3. Mammogram 06/2019. Will continue with annual mammography. Breast exam normal today. 4. Colonoscopy 2016. Recommended that she continue per the prescribed interval.   5. Osteopenia. DEXA 10/2018 indicated significant loss at the left hip and right hip, spine was stable. T score -2.1, FRAX 14% / 6%.  She does maintain good physical activity with weightbearing exercise supplementation with vitamin D and calcium.  Treatment is recommended given the bone density loss, but she declines, citing that she does not do well with prescription medications and had adverse reactions with bisphosphonates in the past.  I do recommend following up for a DEXA scan next year 2022. 6. Health maintenance.  No lab work as she has this completed with her primary care provider.    Return annually or sooner, prn.   Joseph Pierini MD  10/25/19

## 2019-10-25 NOTE — Patient Instructions (Signed)
Please schedule the bone density scan at your convenience and continue the physical activity and calcium/vitamin D

## 2019-11-06 ENCOUNTER — Ambulatory Visit: Payer: PPO | Attending: Internal Medicine

## 2019-11-06 DIAGNOSIS — Z23 Encounter for immunization: Secondary | ICD-10-CM | POA: Insufficient documentation

## 2019-11-06 NOTE — Progress Notes (Signed)
   Covid-19 Vaccination Clinic  Name:  [name]    MRN: [id number] DOB: 1946-03-18  11/06/2019  [name] was observed post Covid-19 immunization for 15 minutes without incidence. She was provided with Vaccine Information Sheet and instruction to access the V-Safe system.   [name] was instructed to call 911 with any severe reactions post vaccine: [name] Kitchen Difficulty breathing  . Swelling of your face and throat  . A fast heartbeat  . A bad rash all over your body  . Dizziness and weakness    Immunizations Administered    Name Date Dose VIS Date Route   Pfizer COVID-19 Vaccine 11/06/2019  9:11 AM 0.3 mL 09/01/2019 Intramuscular   Manufacturer: [place]   Lot: [id number]   [place]: [id number]

## 2019-11-13 ENCOUNTER — Ambulatory Visit: Payer: PPO

## 2020-02-14 ENCOUNTER — Ambulatory Visit (INDEPENDENT_AMBULATORY_CARE_PROVIDER_SITE_OTHER): Payer: PPO

## 2020-02-14 ENCOUNTER — Other Ambulatory Visit: Payer: Self-pay

## 2020-02-14 ENCOUNTER — Encounter: Payer: Self-pay | Admitting: Internal Medicine

## 2020-02-14 ENCOUNTER — Ambulatory Visit (INDEPENDENT_AMBULATORY_CARE_PROVIDER_SITE_OTHER): Payer: PPO | Admitting: Internal Medicine

## 2020-02-14 DIAGNOSIS — W19XXXA Unspecified fall, initial encounter: Secondary | ICD-10-CM | POA: Diagnosis not present

## 2020-02-14 DIAGNOSIS — M25551 Pain in right hip: Secondary | ICD-10-CM

## 2020-02-14 DIAGNOSIS — M25561 Pain in right knee: Secondary | ICD-10-CM

## 2020-02-14 DIAGNOSIS — S79911A Unspecified injury of right hip, initial encounter: Secondary | ICD-10-CM | POA: Diagnosis not present

## 2020-02-14 DIAGNOSIS — M25559 Pain in unspecified hip: Secondary | ICD-10-CM

## 2020-02-14 DIAGNOSIS — S8991XA Unspecified injury of right lower leg, initial encounter: Secondary | ICD-10-CM | POA: Diagnosis not present

## 2020-02-14 NOTE — Patient Instructions (Signed)
Rice sock heating pad Stretch

## 2020-02-14 NOTE — Assessment & Plan Note (Signed)
R hip pain -  s/p fall

## 2020-02-14 NOTE — Progress Notes (Signed)
Subjective:  Patient ID: [name], female    DOB: 1945/12/08  Age: 74 y.o. MRN: [id number]  CC: Hip Pain (Right- she tripped over some plastic netting while walking) and Knee Pain (Right)   HPI [name] presents for R knee and R hip pain, s/p a fall. Last Tue tripped over a piece of netting and fell on the R side. C/o R hip pain and R knee pain. No LOC, no neck injury...  Outpatient Medications Prior to Visit  Medication Sig Dispense Refill  . cholecalciferol (VITAMIN D) 1000 UNITS tablet Take 2,000 Units by mouth daily.     . Multiple Vitamin (MULTIVITAMIN) tablet Take 1 tablet by mouth daily.    . Omega-3 Fatty Acids (FISH OIL) 1000 MG CAPS Take 1 capsule by mouth daily.      . vitamin C (ASCORBIC ACID) 500 MG tablet Take 500 mg by mouth daily.       Facility-Administered Medications Prior to Visit  Medication Dose Route Frequency Provider Last Rate Last Admin  . methylPREDNISolone acetate (DEPO-MEDROL) injection 80 mg  80 mg Intramuscular Once Hulan Saas M, DO        ROS: Review of Systems  Constitutional: Negative for activity change, appetite change, chills, fatigue and unexpected weight change.  HENT: Negative for congestion, mouth sores and sinus pressure.   Eyes: Negative for visual disturbance.  Respiratory: Negative for cough and chest tightness.   Gastrointestinal: Negative for abdominal pain and nausea.  Genitourinary: Negative for difficulty urinating, frequency and vaginal pain.  Musculoskeletal: Positive for arthralgias, back pain and gait problem.  Skin: Negative for pallor and rash.  Neurological: Negative for dizziness, tremors, weakness, numbness and headaches.  Psychiatric/Behavioral: Negative for confusion, sleep disturbance and suicidal ideas.    Objective:  BP 120/64 (BP Location: Left Arm, Patient Position: Sitting, Cuff Size: Normal)   Pulse 71   Temp 98.4 F (36.9 C) (Oral)   Ht 5\' 6"  (1.676 m)   Wt 95 lb (43.1 kg)   SpO2 98%    BMI 15.33 kg/m   BP Readings from Last 3 Encounters:  02/14/20 120/64  10/25/19 116/76  10/01/19 135/73    Wt Readings from Last 3 Encounters:  02/14/20 95 lb (43.1 kg)  10/25/19 94 lb (42.6 kg)  06/06/19 91 lb (41.3 kg)    Physical Exam Constitutional:      General: She is not in acute distress.    Appearance: [name].  HENT:     Head: Normocephalic.     Right Ear: External ear normal.     Left Ear: External ear normal.     Nose: Nose normal.  Eyes:     General:        Right eye: No discharge.        Left eye: No discharge.     Conjunctiva/sclera: Conjunctivae normal.     Pupils: Pupils are equal, round, and reactive to light.  Neck:     Thyroid: No thyromegaly.     Vascular: No JVD.     Trachea: No tracheal deviation.  Cardiovascular:     Rate and Rhythm: Normal rate and regular rhythm.     Heart sounds: Normal heart sounds.  Pulmonary:     Effort: No respiratory distress.     Breath sounds: No stridor. No wheezing.  Abdominal:     General: Bowel sounds are normal. There is no distension.     Palpations: Abdomen is soft. There is no mass.  Tenderness: There is no abdominal tenderness. There is no guarding or rebound.  Musculoskeletal:        General: Tenderness present.     Cervical back: Normal range of motion and neck supple.  Lymphadenopathy:     Cervical: No cervical adenopathy.  Skin:    Findings: No erythema or rash.  Neurological:     Cranial Nerves: No cranial nerve deficit.     Motor: No abnormal muscle tone.     Coordination: Coordination normal.     Gait: Gait abnormal.     Deep Tendon Reflexes: Reflexes normal.  Psychiatric:        Behavior: Behavior normal.        Thought Content: Thought content normal.        Judgment: Judgment normal.    R hip, R knee  w/hematoma, pain w/ROM, w/palpation  Lab Results  Component Value Date   WBC 5.9 06/08/2019   HGB 14.3 06/08/2019   HCT 43.9 06/08/2019   PLT 260.0 06/08/2019    GLUCOSE 90 06/08/2019   CHOL 195 06/08/2019   TRIG 68.0 06/08/2019   HDL 88.60 06/08/2019   LDLDIRECT 107.6 08/15/2012   LDLCALC 93 06/08/2019   ALT 20 06/08/2019   AST 41 (H) 06/08/2019   NA 137 06/08/2019   K 4.1 06/08/2019   CL 100 06/08/2019   CREATININE 0.68 06/08/2019   BUN 12 06/08/2019   CO2 29 06/08/2019   TSH 3.47 06/08/2019    DG Ankle Complete Left  Result Date: 10/01/2019 CLINICAL DATA:  Ankle pain and swelling. EXAM: LEFT ANKLE COMPLETE - 3+ VIEW COMPARISON:  None. FINDINGS: There is no evidence of fracture, dislocation, or joint effusion. There is no evidence of arthropathy or other focal bone abnormality. Soft tissues are unremarkable. IMPRESSION: Negative. Electronically Signed   By: Constance Holster M.D.   On: 10/01/2019 14:51    Assessment & Plan:   There are no diagnoses linked to this encounter.   No orders of the defined types were placed in this encounter.    Follow-up: No follow-ups on file.  Walker Kehr, MD

## 2020-02-14 NOTE — Assessment & Plan Note (Signed)
R knee and R hip pain, s/p a fall. Last Tue tripped over a piece of netting and fell on the R side. C/o R hip pain and R knee pain. No LOC, no neck injury.[name] KitchenMarland Kitchen

## 2020-02-14 NOTE — Assessment & Plan Note (Signed)
S/p fall

## 2020-03-27 ENCOUNTER — Other Ambulatory Visit: Payer: Self-pay

## 2020-03-27 ENCOUNTER — Encounter: Payer: Self-pay | Admitting: Internal Medicine

## 2020-03-27 ENCOUNTER — Ambulatory Visit (INDEPENDENT_AMBULATORY_CARE_PROVIDER_SITE_OTHER): Payer: PPO | Admitting: Internal Medicine

## 2020-03-27 VITALS — BP 140/70 | HR 94 | Temp 98.7°F | Ht 66.0 in | Wt 94.0 lb

## 2020-03-27 DIAGNOSIS — M545 Low back pain, unspecified: Secondary | ICD-10-CM

## 2020-03-27 MED ORDER — TIZANIDINE HCL 2 MG PO TABS: 2.0000 mg | ORAL_TABLET | Freq: Four times a day (QID) | ORAL | 1 refills | Status: DC | PRN

## 2020-03-27 MED ORDER — ACETAMINOPHEN-CODEINE #3 300-30 MG PO TABS: 1.0000 | ORAL_TABLET | Freq: Four times a day (QID) | ORAL | 0 refills | Status: DC | PRN

## 2020-03-27 NOTE — Assessment & Plan Note (Addendum)
C/w msk spasm, no radicular symptoms, neuro otherwise intact, for pain control, muscle relaxer prn, otx salonpaz  I spent 21 minutes in preparing to see the patient by review of recent labs, imaging and procedures, obtaining and reviewing separately obtained history, communicating with the patient and family or caregiver, ordering medications, tests or procedures, and documenting clinical information in the EHR including the differential Dx, treatment, and any further evaluation and other management of low back pain

## 2020-03-27 NOTE — Patient Instructions (Signed)
Please take all new medication as prescribed - the tylenol #3 (tylenol with codeine) as needed, and the muscle relaxer as needed  Please also remember the OTC Salonpaz as needed as well  Please continue all other medications as before, and refills have been done if requested.  Please have the pharmacy call with any other refills you may need.  Please keep your appointments with your specialists as you may have planned

## 2020-03-27 NOTE — Progress Notes (Signed)
Subjective:    Patient ID: [name], female    DOB: 1946/05/18, 74 y.o.   MRN: [id number]  HPI  Here with co sudden onset left lbp spasm at 141 am yesterday, started with turning over in bed and made her yell; 2 days spent 2 hours with coughing and trying to get up a vitamin C she had aspirated; does walk every day and usually no significan pain, except for a trip and fall in may 2021 and saw PCP with pain and seemed better after. Pt denies new neurological symptoms such as new headache, or facial or extremity weakness or numbness  Pt denies chest pain, increased sob or doe, wheezing, orthopnea, PND, increased LE swelling, palpitations, dizziness or syncope.   Pt denies polydipsia, polyuria, Past Medical History:  Diagnosis Date  . Adenomatous colon polyp   . Atrophic vaginitis   . Benign neoplasm of colon 01/18/2008  . DIVERTICULOSIS, MILD 10/26/2007   colonoscopy 10/26/07  . H pylori ulcer   . HEMORRHOIDS, INTERNAL 01/18/2008  . OSTEOPENIA 10/2018   T score 2.1 FRAX 14% / 6%  . Skin disorder    NOT PARAPSORIASIS--ETIOLOGY UNCLEAR  . Ulcer 01/31/06   STOMACH ULCER   Past Surgical History:  Procedure Laterality Date  . APPENDECTOMY  1963  . COLONOSCOPY W/ BIOPSIES     multiple  . ESOPHAGOGASTRODUODENOSCOPY  03/30/2006  . GANGLION CYST EXCISION  1972   Right  . Gastric Ulcer and GI Bleed (hospitalization)  5/07  . LYMPH NODE BIOPSY  2001   Benign from the groin-Inflammatory changes due to her skin condition.  . s/p eradication of H. Pylori     confirmed by urea breath teast  . SKIN CANCER EXCISION     removed from the forehead  . TONSILLECTOMY  1955  . TUBAL LIGATION      reports that she has never smoked. She has never used smokeless tobacco. She reports current alcohol use of about 7.0 standard drinks of alcohol per week. She reports that she does not use drugs. family history includes Breast cancer in her cousin, cousin, cousin, maternal aunt, and mother; Colon cancer in  her cousin, maternal grandfather, and maternal uncle; Heart disease in her father, maternal grandmother, and mother. Allergies  Allergen Reactions  . Fosamax [Alendronate Sodium] Other (See Comments)    Abdominal pain   Current Outpatient Medications on File Prior to Visit  Medication Sig Dispense Refill  . cholecalciferol (VITAMIN D) 1000 UNITS tablet Take 2,000 Units by mouth daily.     . Multiple Vitamin (MULTIVITAMIN) tablet Take 1 tablet by mouth daily.    . Multiple Vitamins-Minerals (MULTIVITAMIN ADULTS 50+) TABS     . Omega-3 Fatty Acids (FISH OIL) 1000 MG CAPS Take 1 capsule by mouth daily.      . vitamin C (ASCORBIC ACID) 500 MG tablet Take 500 mg by mouth daily.       Current Facility-Administered Medications on File Prior to Visit  Medication Dose Route Frequency Provider Last Rate Last Admin  . methylPREDNISolone acetate (DEPO-MEDROL) injection 80 mg  80 mg Intramuscular Once Lyndal Pulley, DO       Review of Systems All otherwise neg per pt     Objective:   Physical Exam BP 140/70 (BP Location: Right Arm, Patient Position: Sitting, Cuff Size: Large)   Pulse 94   Temp 98.7 F (37.1 C) (Oral)   Ht 5\' 6"  (1.676 m)   Wt 94 lb (42.6 kg)  SpO2 98%   BMI 15.17 kg/m  VS noted,  Constitutional: Pt appears in NAD HENT: Head: NCAT.  Right Ear: External ear normal.  Left Ear: External ear normal.  Eyes: . Pupils are equal, round, and reactive to light. Conjunctivae and EOM are normal Nose: without d/c or deformity Neck: Neck supple. Gross normal ROM Cardiovascular: Normal rate and regular rhythm.   Pulmonary/Chest: Effort normal and breath sounds without rales or wheezing.  Abd:  Soft, NT, ND, + BS, no organomegaly Spine nontender, + tender left lumbar paravertebral spasm Neurological: Pt is alert. At baseline orientation, motor grossly intact Skin: Skin is warm. No rashes, other new lesions, no LE edema Psychiatric: Pt behavior is normal without agitation  Lab  Results  Component Value Date   WBC 5.9 06/08/2019   HGB 14.3 06/08/2019   HCT 43.9 06/08/2019   PLT 260.0 06/08/2019   GLUCOSE 90 06/08/2019   CHOL 195 06/08/2019   TRIG 68.0 06/08/2019   HDL 88.60 06/08/2019   LDLDIRECT 107.6 08/15/2012   LDLCALC 93 06/08/2019   ALT 20 06/08/2019   AST 41 (H) 06/08/2019   NA 137 06/08/2019   K 4.1 06/08/2019   CL 100 06/08/2019   CREATININE 0.68 06/08/2019   BUN 12 06/08/2019   CO2 29 06/08/2019   TSH 3.47 06/08/2019      Assessment & Plan:

## 2020-03-28 NOTE — Telephone Encounter (Signed)
Pt saw Dr. Jenny Reichmann on yesterday will forward to him to address since he was the prescribing provider.[name] KitchenJohny [name]

## 2020-04-02 ENCOUNTER — Other Ambulatory Visit: Payer: Self-pay

## 2020-04-02 ENCOUNTER — Ambulatory Visit (INDEPENDENT_AMBULATORY_CARE_PROVIDER_SITE_OTHER): Payer: PPO

## 2020-04-02 ENCOUNTER — Encounter: Payer: Self-pay | Admitting: Internal Medicine

## 2020-04-02 ENCOUNTER — Ambulatory Visit (INDEPENDENT_AMBULATORY_CARE_PROVIDER_SITE_OTHER): Payer: PPO | Admitting: Internal Medicine

## 2020-04-02 DIAGNOSIS — R05 Cough: Secondary | ICD-10-CM | POA: Diagnosis not present

## 2020-04-02 DIAGNOSIS — M545 Low back pain, unspecified: Secondary | ICD-10-CM

## 2020-04-02 DIAGNOSIS — R059 Cough, unspecified: Secondary | ICD-10-CM

## 2020-04-02 MED ORDER — CELECOXIB 100 MG PO CAPS: 100.0000 mg | ORAL_CAPSULE | Freq: Two times a day (BID) | ORAL | 1 refills | Status: DC | PRN

## 2020-04-02 NOTE — Progress Notes (Signed)
Subjective:  Patient ID: [name], female    DOB: [birth year]  Age: 74 y.o. MRN: [id number]  CC: No chief complaint on file.   HPI ODEAN MCELWAIN presents for choking on Vit C - pt was coughing for 2 hrs on Friday 7/2. On Tue developed severe LBP on the l on 7/6 saw Dr Jenny Reichmann - had Zanaflex, T#3  Outpatient Medications Prior to Visit  Medication Sig Dispense Refill   acetaminophen-codeine (TYLENOL #3) 300-30 MG tablet Take 1 tablet by mouth every 6 (six) hours as needed for moderate pain. 30 tablet 0   cholecalciferol (VITAMIN D) 1000 UNITS tablet Take 2,000 Units by mouth daily.      Multiple Vitamins-Minerals (MULTIVITAMIN ADULTS 50+) TABS      Omega-3 Fatty Acids (FISH OIL) 1000 MG CAPS Take 1 capsule by mouth daily.       tiZANidine (ZANAFLEX) 2 MG tablet Take 1 tablet (2 mg total) by mouth every 6 (six) hours as needed for muscle spasms. 40 tablet 1   vitamin C (ASCORBIC ACID) 500 MG tablet Take 500 mg by mouth daily.       Multiple Vitamin (MULTIVITAMIN) tablet Take 1 tablet by mouth daily.     Facility-Administered Medications Prior to Visit  Medication Dose Route Frequency Provider Last Rate Last Admin   methylPREDNISolone acetate (DEPO-MEDROL) injection 80 mg  80 mg Intramuscular Once Hulan Saas M, DO        ROS: Review of Systems  Constitutional: Negative for activity change, appetite change, chills, fatigue and unexpected weight change.  HENT: Negative for congestion, mouth sores and sinus pressure.   Eyes: Negative for visual disturbance.  Respiratory: Positive for cough. Negative for chest tightness.   Gastrointestinal: Negative for abdominal pain and nausea.  Genitourinary: Negative for difficulty urinating, frequency and vaginal pain.  Musculoskeletal: Positive for back pain. Negative for gait problem.  Skin: Negative for pallor and rash.  Neurological: Negative for dizziness, tremors, weakness, numbness and headaches.  Psychiatric/Behavioral:  Negative for confusion and sleep disturbance.    Objective:  BP 140/76 (BP Location: Left Arm, Patient Position: Sitting, Cuff Size: Small)    Pulse 82    Temp 98.7 F (37.1 C) (Oral)    Ht 5\' 6"  (1.676 m)    Wt 92 lb (41.7 kg)    SpO2 91%    BMI 14.85 kg/m   BP Readings from Last 3 Encounters:  04/02/20 140/76  03/27/20 140/70  02/14/20 120/64    Wt Readings from Last 3 Encounters:  04/02/20 92 lb (41.7 kg)  03/27/20 94 lb (42.6 kg)  02/14/20 95 lb (43.1 kg)    Physical Exam Constitutional:      General: She is not in acute distress.    Appearance: [name].  HENT:     Head: Normocephalic.     Right Ear: External ear normal.     Left Ear: External ear normal.     Nose: Nose normal.  Eyes:     General:        Right eye: No discharge.        Left eye: No discharge.     Conjunctiva/sclera: Conjunctivae normal.     Pupils: Pupils are equal, round, and reactive to light.  Neck:     Thyroid: No thyromegaly.     Vascular: No JVD.     Trachea: No tracheal deviation.  Cardiovascular:     Rate and Rhythm: Normal rate and regular rhythm.  Heart sounds: Normal heart sounds.  Pulmonary:     Effort: No respiratory distress.     Breath sounds: No stridor. No wheezing.  Abdominal:     General: Bowel sounds are normal. There is no distension.     Palpations: Abdomen is soft. There is no mass.     Tenderness: There is no abdominal tenderness. There is no guarding or rebound.  Musculoskeletal:        General: Tenderness present.     Cervical back: Normal range of motion and neck supple.  Lymphadenopathy:     Cervical: No cervical adenopathy.  Skin:    Findings: No erythema or rash.  Neurological:     Cranial Nerves: No cranial nerve deficit.     Motor: No abnormal muscle tone.     Coordination: Coordination normal.     Deep Tendon Reflexes: Reflexes normal.  Psychiatric:        Behavior: Behavior normal.        Thought Content: Thought content normal.          Judgment: Judgment normal.     Lab Results  Component Value Date   WBC 5.9 06/08/2019   HGB 14.3 06/08/2019   HCT 43.9 06/08/2019   PLT 260.0 06/08/2019   GLUCOSE 90 06/08/2019   CHOL 195 06/08/2019   TRIG 68.0 06/08/2019   HDL 88.60 06/08/2019   LDLDIRECT 107.6 08/15/2012   LDLCALC 93 06/08/2019   ALT 20 06/08/2019   AST 41 (H) 06/08/2019   NA 137 06/08/2019   K 4.1 06/08/2019   CL 100 06/08/2019   CREATININE 0.68 06/08/2019   BUN 12 06/08/2019   CO2 29 06/08/2019   TSH 3.47 06/08/2019    DG Ankle Complete Left  Result Date: 10/01/2019 CLINICAL DATA:  Ankle pain and swelling. EXAM: LEFT ANKLE COMPLETE - 3+ VIEW COMPARISON:  None. FINDINGS: There is no evidence of fracture, dislocation, or joint effusion. There is no evidence of arthropathy or other focal bone abnormality. Soft tissues are unremarkable. IMPRESSION: Negative. Electronically Signed   By: Constance Holster M.D.   On: 10/01/2019 14:51    Assessment & Plan:    Walker Kehr, MD

## 2020-04-02 NOTE — Assessment & Plan Note (Signed)
S/p choking on Vit C - pt was coughing for 2 hrs on Friday 03/22/20. CXR

## 2020-04-02 NOTE — Assessment & Plan Note (Signed)
LS X ray Celebrex prn

## 2020-04-16 ENCOUNTER — Other Ambulatory Visit: Payer: Self-pay

## 2020-04-16 ENCOUNTER — Encounter: Payer: Self-pay | Admitting: Internal Medicine

## 2020-04-16 ENCOUNTER — Ambulatory Visit (INDEPENDENT_AMBULATORY_CARE_PROVIDER_SITE_OTHER): Payer: PPO | Admitting: Internal Medicine

## 2020-04-16 VITALS — BP 126/78 | HR 73 | Temp 98.7°F | Ht 66.0 in | Wt 91.0 lb

## 2020-04-16 DIAGNOSIS — M545 Low back pain, unspecified: Secondary | ICD-10-CM

## 2020-04-16 DIAGNOSIS — R05 Cough: Secondary | ICD-10-CM | POA: Diagnosis not present

## 2020-04-16 DIAGNOSIS — R059 Cough, unspecified: Secondary | ICD-10-CM

## 2020-04-16 DIAGNOSIS — T17908S Unspecified foreign body in respiratory tract, part unspecified causing other injury, sequela: Secondary | ICD-10-CM | POA: Diagnosis not present

## 2020-04-16 NOTE — Progress Notes (Signed)
Subjective:  Patient ID: [name], female    DOB: Jan 24, 1946  Age: 74 y.o. MRN: [id number]  CC: No chief complaint on file.   HPI [name] presents for dry cough - not better in 3 weeks, some wheezing. Eating or drinking makes it worse F/u LBP better No SOB, no fever, chills  Outpatient Medications Prior to Visit  Medication Sig Dispense Refill  . cholecalciferol (VITAMIN D) 1000 UNITS tablet Take 2,000 Units by mouth daily.     . Multiple Vitamins-Minerals (MULTIVITAMIN ADULTS 50+) TABS     . Omega-3 Fatty Acids (FISH OIL) 1000 MG CAPS Take 1 capsule by mouth daily.       Facility-Administered Medications Prior to Visit  Medication Dose Route Frequency Provider Last Rate Last Admin  . methylPREDNISolone acetate (DEPO-MEDROL) injection 80 mg  80 mg Intramuscular Once Hulan Saas M, DO        ROS: Review of Systems  Constitutional: Negative for activity change, appetite change, chills, fatigue and unexpected weight change.  HENT: Negative for congestion, mouth sores and sinus pressure.   Eyes: Negative for visual disturbance.  Respiratory: Positive for cough and wheezing. Negative for chest tightness and shortness of breath.   Gastrointestinal: Negative for abdominal pain and nausea.  Genitourinary: Negative for difficulty urinating, frequency and vaginal pain.  Musculoskeletal: Negative for back pain and gait problem.  Skin: Negative for pallor and rash.  Neurological: Negative for dizziness, tremors, weakness, numbness and headaches.  Psychiatric/Behavioral: Negative for confusion and sleep disturbance.    Objective:  BP 126/78 (BP Location: Left Arm, Patient Position: Sitting, Cuff Size: Small)   Pulse 73   Temp 98.7 F (37.1 C) (Oral)   Ht 5\' 6"  (1.676 m)   Wt (!) 91 lb (41.3 kg)   SpO2 97%   BMI 14.69 kg/m   BP Readings from Last 3 Encounters:  04/16/20 126/78  04/02/20 140/76  03/27/20 140/70    Wt Readings from Last 3 Encounters:    04/16/20 (!) 91 lb (41.3 kg)  04/02/20 92 lb (41.7 kg)  03/27/20 94 lb (42.6 kg)    Physical Exam Constitutional:      General: She is not in acute distress.    Appearance: [name].  HENT:     Head: Normocephalic.     Right Ear: External ear normal.     Left Ear: External ear normal.     Nose: Nose normal.  Eyes:     General:        Right eye: No discharge.        Left eye: No discharge.     Conjunctiva/sclera: Conjunctivae normal.     Pupils: Pupils are equal, round, and reactive to light.  Neck:     Thyroid: No thyromegaly.     Vascular: No JVD.     Trachea: No tracheal deviation.  Cardiovascular:     Rate and Rhythm: Normal rate and regular rhythm.     Heart sounds: Normal heart sounds.  Pulmonary:     Effort: No respiratory distress.     Breath sounds: No stridor. No wheezing.  Abdominal:     General: Bowel sounds are normal. There is no distension.     Palpations: Abdomen is soft. There is no mass.     Tenderness: There is no abdominal tenderness. There is no guarding or rebound.  Musculoskeletal:        General: No tenderness.     Cervical back: Normal range of  motion and neck supple.  Lymphadenopathy:     Cervical: No cervical adenopathy.  Skin:    Findings: No erythema or rash.  Neurological:     Cranial Nerves: No cranial nerve deficit.     Motor: No abnormal muscle tone.     Coordination: Coordination normal.     Deep Tendon Reflexes: Reflexes normal.  Psychiatric:        Behavior: Behavior normal.        Thought Content: Thought content normal.        Judgment: Judgment normal.   LS sensitive w/ROM  Lab Results  Component Value Date   WBC 5.9 06/08/2019   HGB 14.3 06/08/2019   HCT 43.9 06/08/2019   PLT 260.0 06/08/2019   GLUCOSE 90 06/08/2019   CHOL 195 06/08/2019   TRIG 68.0 06/08/2019   HDL 88.60 06/08/2019   LDLDIRECT 107.6 08/15/2012   LDLCALC 93 06/08/2019   ALT 20 06/08/2019   AST 41 (H) 06/08/2019   NA 137 06/08/2019    K 4.1 06/08/2019   CL 100 06/08/2019   CREATININE 0.68 06/08/2019   BUN 12 06/08/2019   CO2 29 06/08/2019   TSH 3.47 06/08/2019    DG Ankle Complete Left  Result Date: 10/01/2019 CLINICAL DATA:  Ankle pain and swelling. EXAM: LEFT ANKLE COMPLETE - 3+ VIEW COMPARISON:  None. FINDINGS: There is no evidence of fracture, dislocation, or joint effusion. There is no evidence of arthropathy or other focal bone abnormality. Soft tissues are unremarkable. IMPRESSION: Negative. Electronically Signed   By: Constance Holster M.D.   On: 10/01/2019 14:51    Assessment & Plan:    Walker Kehr, MD

## 2020-04-16 NOTE — Assessment & Plan Note (Signed)
Medrol pack offered- refused

## 2020-04-16 NOTE — Assessment & Plan Note (Addendum)
S/p choking on Vit C pill - pt was coughing hard for 2 hrs after. R/o pill aspiration. Cough is not better in 3 wks Chest CT offered - pt refused Medrol pack, MDI offered - pt refused PO abx if fever, etc Pulm ref

## 2020-05-03 ENCOUNTER — Encounter: Payer: Self-pay | Admitting: Pulmonary Disease

## 2020-05-03 ENCOUNTER — Ambulatory Visit: Payer: PPO | Admitting: Pulmonary Disease

## 2020-05-03 VITALS — BP 106/62 | HR 62 | Temp 98.2°F | Ht 66.0 in | Wt 91.6 lb

## 2020-05-03 DIAGNOSIS — R1310 Dysphagia, unspecified: Secondary | ICD-10-CM

## 2020-05-03 NOTE — Progress Notes (Signed)
Patient ID: [name], female    DOB: 10-25-1945, 74 y.o.   MRN: [id number]  Chief Complaint  Patient presents with   Consult    referred for aspiration follow up, aspirated on vit C, still has some non productive cough after drinking some things    Referring provider: Plotnikov, Evie Lacks, MD  HPI:   [name] is a 74 y.o. with no significant past medical history whom we are seen in consultation at the request of Lew Dawes MD for evaluation of dysphagia/aspiration event.  Note from referring provider reviewed.  Patient was in usual state of health until about 2 to 4 weeks ago.  She was taking her daily vitamin C when she developed onset of coughing.  Severe fit.  Not quite choking but did have cough for 2 hours.  History obtained which was on my interpretation clear of infiltrate but slightly hyperinflated.  Cough gradually resolved over time.  Still has occasional sensation of dysphagia or something getting stuck on the right side of her throat.  Happened yesterday with 1/2 tablet of vitamin C.  She attributed this to the rough surface as it started to resolve once contacting liquid or saliva.  On further questioning, she has had intermittent but longstanding issues with swallowing, occasionally solid foods feeling like they get stuck in the right side of her throat.  Does cough with these episodes but not reliably so.  Usually improves with swallowing water so she always make sure to have a glass of water nearby.  No odynophagia.  No weight loss.  She has not had issues swallowing other pills in the past or recently.  No issues with warm liquids, occasionally cold liquids to give her some swallowing issues.  PMH: Low back pain Surgical history: Tubal ligation, appendectomy Family history: Mother and father with heart disease, mother with cancer Social history: Never smoker, rare alcohol use, lives in [place] with husband   Questionaires / Pulmonary Flowsheets:   ACT:   No flowsheet data found.  MMRC: No flowsheet data found.  Epworth:  No flowsheet data found.  Tests:   FENO:  No results found for: NITRICOXIDE  PFT: No flowsheet data found.  WALK:  No flowsheet data found.  Imaging: Chest x-ray 04/02/20 on my interpretation is clear, no evidence of pneumonia, deep inspiration slightly hyperinflated  Lab Results: Personally reviewed CBC    Component Value Date/Time   WBC 5.9 06/08/2019 0752   RBC 4.59 06/08/2019 0752   HGB 14.3 06/08/2019 0752   HCT 43.9 06/08/2019 0752   PLT 260.0 06/08/2019 0752   MCV 95.6 06/08/2019 0752   MCH 30.6 11/24/2015 1354   MCHC 32.5 06/08/2019 0752   RDW 12.5 06/08/2019 0752   LYMPHSABS 1.8 06/08/2019 0752   MONOABS 0.7 06/08/2019 0752   EOSABS 0.1 06/08/2019 0752   BASOSABS 0.0 06/08/2019 0752    BMET    Component Value Date/Time   NA 137 06/08/2019 0752   K 4.1 06/08/2019 0752   CL 100 06/08/2019 0752   CO2 29 06/08/2019 0752   GLUCOSE 90 06/08/2019 0752   BUN 12 06/08/2019 0752   CREATININE 0.68 06/08/2019 0752   CALCIUM 9.8 06/08/2019 0752   CALCIUM 9.9 08/01/2012 0933   GFRNONAA >60 11/24/2015 1354   GFRAA >60 11/24/2015 1354    BNP No results found for: BNP  ProBNP No results found for: PROBNP  Specialty Problems      Pulmonary Problems   UPPER RESPIRATORY INFECTION (URI)  Qualifier: Diagnosis of  By: Plotnikov MD, Evie Lacks       SINUSITIS, ACUTE    Qualifier: Diagnosis of  By: Plotnikov MD, Evie Lacks       Cough    1/14, 9/18 03/22/20 S/p choking on Vit C pill - pt was coughing hard for 2 hrs after. R/o pill aspiration. Cough is not better in 3 wks Chest CT offered - pt refused Medrol pack, MDI offered - pt refused PO abx - if fever etc Pulm ref         Allergies  Allergen Reactions   Fosamax [Alendronate Sodium] Other (See Comments)    Abdominal pain   Zanaflex [Tizanidine]     Did not feel good    Immunization History  Administered Date(s)  Administered   Fluad Quad(high Dose 65+) 06/06/2019   Influenza Split 07/16/2011, 07/05/2012   Influenza, High Dose Seasonal PF 06/16/2016, 06/29/2017, 06/27/2018   Influenza,inj,Quad PF,6+ Mos 06/20/2013, 06/21/2014, 06/25/2015   PFIZER SARS-COV-2 Vaccination 10/16/2019, 11/06/2019   Pneumococcal Conjugate-13 08/14/2013   Pneumococcal Polysaccharide-23 05/07/2011   Tdap 05/07/2011   Zoster 02/16/2008    Past Medical History:  Diagnosis Date   Adenomatous colon polyp    Atrophic vaginitis    Benign neoplasm of colon 01/18/2008   DIVERTICULOSIS, MILD 10/26/2007   colonoscopy 10/26/07   H pylori ulcer    HEMORRHOIDS, INTERNAL 01/18/2008   OSTEOPENIA 10/2018   T score 2.1 FRAX 14% / 6%   Skin disorder    NOT PARAPSORIASIS--ETIOLOGY UNCLEAR   Ulcer 01/31/06   STOMACH ULCER    Tobacco History: Social History   Tobacco Use  Smoking Status Never Smoker  Smokeless Tobacco Never Used   Counseling given: Not Answered   Continue to not smoke  Outpatient Encounter Medications as of 05/03/2020  Medication Sig   cholecalciferol (VITAMIN D) 1000 UNITS tablet Take 2,000 Units by mouth daily.    Multiple Vitamins-Minerals (MULTIVITAMIN ADULTS 50+) TABS    Omega-3 Fatty Acids (FISH OIL) 1000 MG CAPS Take 1 capsule by mouth daily.     Facility-Administered Encounter Medications as of 05/03/2020  Medication   methylPREDNISolone acetate (DEPO-MEDROL) injection 80 mg     Review of Systems  Review of Systems  She denies any chronic cough.  Occasional runny nose that resolves in the morning.  No chest pain with exertion.  No dyspnea, shortness of breath, orthopnea, or PND.  Comprehensive review of systems otherwise negative. Physical Exam  BP 106/62 (BP Location: Left Arm, Cuff Size: Normal)    Pulse 62    Temp 98.2 F (36.8 C) (Temporal)    Ht 5\' 6"  (1.676 m)    Wt 91 lb 9.6 oz (41.5 kg)    SpO2 98%    BMI 14.78 kg/m   Wt Readings from Last 5 Encounters:    05/03/20 91 lb 9.6 oz (41.5 kg)  04/16/20 (!) 91 lb (41.3 kg)  04/02/20 92 lb (41.7 kg)  03/27/20 94 lb (42.6 kg)  02/14/20 95 lb (43.1 kg)    BMI Readings from Last 5 Encounters:  05/03/20 14.78 kg/m  04/16/20 14.69 kg/m  04/02/20 14.85 kg/m  03/27/20 15.17 kg/m  02/14/20 15.33 kg/m     Physical Exam General: Thin, in no acute distress Eyes: EOMI, no icterus Neck: Bilateral cervical adenopathy patient states is chronic, right larger than left, supple Respiratory: Clear to auscultation bilaterally no isolated wheeze or abnormal inspiratory sound Cardiovascular: Regular rhythm, no murmurs Abdomen/GI: Soft, bowel sounds present MSK: No  synovitis, no joint effusions Neuro: Normal gait, no weakness Psych: Normal mood, full affect  Assessment & Plan:   Ms. Dambrosio is a 74 y.o. with no significant past medical history whom we are seen in consultation at the request of Lew Dawes MD for evaluation of dysphagia/aspiration event.  Aspiration with vitamin C tablet.  Try to half a tablet yesterday with similar results.  Symptoms of coughing.  Has not happened in the past with medicines.  Does report off and on history of dysphagia.  No concern for pneumonia or aspirated foreign body at this time given normal prior chest x-ray, lack of symptoms, and normal exam today.  Will investigate dysphagia with barium swallow with double contrast material.  If abnormalities present suspect will refer to speech therapy for further evaluation.  If normal, advised to follow aspiration precautions and follow-up as needed.   Follow-up as needed   Lanier Clam, MD 05/03/2020

## 2020-05-03 NOTE — Patient Instructions (Addendum)
Nice to meet you.  I ordered a barium swallow. Ask the price when they call to schedule.  We will decide follow up based on the results.

## 2020-05-09 ENCOUNTER — Ambulatory Visit (HOSPITAL_COMMUNITY)
Admission: RE | Admit: 2020-05-09 | Discharge: 2020-05-09 | Disposition: A | Discharge status: Home / Self Care | Payer: PPO | Source: Ambulatory Visit | Attending: Pulmonary Disease | Admitting: Pulmonary Disease

## 2020-05-09 ENCOUNTER — Other Ambulatory Visit: Payer: Self-pay

## 2020-05-09 DIAGNOSIS — K224 Dyskinesia of esophagus: Secondary | ICD-10-CM | POA: Diagnosis not present

## 2020-05-09 DIAGNOSIS — R1310 Dysphagia, unspecified: Secondary | ICD-10-CM | POA: Insufficient documentation

## 2020-06-20 ENCOUNTER — Other Ambulatory Visit: Payer: Self-pay

## 2020-06-20 ENCOUNTER — Ambulatory Visit (INDEPENDENT_AMBULATORY_CARE_PROVIDER_SITE_OTHER): Payer: PPO | Admitting: Internal Medicine

## 2020-06-20 ENCOUNTER — Encounter: Payer: Self-pay | Admitting: Internal Medicine

## 2020-06-20 VITALS — BP 120/74 | HR 78 | Temp 98.4°F | Ht 66.0 in | Wt 91.0 lb

## 2020-06-20 DIAGNOSIS — R059 Cough, unspecified: Secondary | ICD-10-CM

## 2020-06-20 DIAGNOSIS — M545 Low back pain, unspecified: Secondary | ICD-10-CM

## 2020-06-20 DIAGNOSIS — R5382 Chronic fatigue, unspecified: Secondary | ICD-10-CM | POA: Diagnosis not present

## 2020-06-20 DIAGNOSIS — R05 Cough: Secondary | ICD-10-CM | POA: Diagnosis not present

## 2020-06-20 DIAGNOSIS — G9332 Myalgic encephalomyelitis/chronic fatigue syndrome: Secondary | ICD-10-CM

## 2020-06-20 DIAGNOSIS — Z23 Encounter for immunization: Secondary | ICD-10-CM

## 2020-06-20 DIAGNOSIS — I2510 Atherosclerotic heart disease of native coronary artery without angina pectoris: Secondary | ICD-10-CM

## 2020-06-20 DIAGNOSIS — E785 Hyperlipidemia, unspecified: Secondary | ICD-10-CM

## 2020-06-20 DIAGNOSIS — I2583 Coronary atherosclerosis due to lipid rich plaque: Secondary | ICD-10-CM | POA: Diagnosis not present

## 2020-06-20 NOTE — Assessment & Plan Note (Addendum)
Not better Chest CT -  R/o pill aspiration. Pt declined PPI, claritin trials

## 2020-06-20 NOTE — Assessment & Plan Note (Signed)
Tylenol prn Pt declined PT, w/up

## 2020-06-20 NOTE — Progress Notes (Signed)
Subjective:  Patient ID: [name], female    DOB: [birth year]  Age: 74 y.o. MRN: [id number]  CC: No chief complaint on file.   HPI [name] presents for a well exam  C/o cough - worse; gurgling since 7/21. C/o hoarseness - new  Outpatient Medications Prior to Visit  Medication Sig Dispense Refill  . cholecalciferol (VITAMIN D) 1000 UNITS tablet Take 2,000 Units by mouth daily.     . Multiple Vitamins-Minerals (MULTIVITAMIN ADULTS 50+) TABS     . Omega-3 Fatty Acids (FISH OIL) 1000 MG CAPS Take 1 capsule by mouth daily.       Facility-Administered Medications Prior to Visit  Medication Dose Route Frequency Provider Last Rate Last Admin  . methylPREDNISolone acetate (DEPO-MEDROL) injection 80 mg  80 mg Intramuscular Once Hulan Saas M, DO        ROS: Review of Systems  Constitutional: Negative for activity change, appetite change, chills, fatigue and unexpected weight change.  HENT: Negative for congestion, mouth sores and sinus pressure.   Eyes: Negative for visual disturbance.  Respiratory: Positive for cough. Negative for chest tightness and shortness of breath.   Cardiovascular: Negative for chest pain.  Gastrointestinal: Negative for abdominal pain and nausea.  Genitourinary: Negative for difficulty urinating, frequency and vaginal pain.  Musculoskeletal: Negative for back pain and gait problem.  Skin: Negative for pallor and rash.  Neurological: Negative for dizziness, tremors, weakness, numbness and headaches.  Psychiatric/Behavioral: Negative for confusion and sleep disturbance.    Objective:  BP 120/74 (BP Location: Left Arm, Patient Position: Sitting, Cuff Size: Normal)   Pulse 78   Temp 98.4 F (36.9 C) (Oral)   Ht 5\' 6"  (1.676 m)   Wt 91 lb (41.3 kg)   SpO2 97%   BMI 14.69 kg/m   BP Readings from Last 3 Encounters:  06/20/20 120/74  05/03/20 106/62  04/16/20 126/78    Wt Readings from Last 3 Encounters:  06/20/20 91 lb (41.3 kg)    05/03/20 91 lb 9.6 oz (41.5 kg)  04/16/20 (!) 91 lb (41.3 kg)    Physical Exam Constitutional:      General: She is not in acute distress.    Appearance: [name].  HENT:     Head: Normocephalic.     Right Ear: External ear normal.     Left Ear: External ear normal.     Nose: Nose normal.  Eyes:     General:        Right eye: No discharge.        Left eye: No discharge.     Conjunctiva/sclera: Conjunctivae normal.     Pupils: Pupils are equal, round, and reactive to light.  Neck:     Thyroid: No thyromegaly.     Vascular: No JVD.     Trachea: No tracheal deviation.  Cardiovascular:     Rate and Rhythm: Normal rate and regular rhythm.     Heart sounds: Normal heart sounds.  Pulmonary:     Effort: No respiratory distress.     Breath sounds: No stridor. No wheezing.  Abdominal:     General: Bowel sounds are normal. There is no distension.     Palpations: Abdomen is soft. There is no mass.     Tenderness: There is no abdominal tenderness. There is no guarding or rebound.  Musculoskeletal:        General: No tenderness.     Cervical back: Normal range of motion and neck supple.  Lymphadenopathy:     Cervical: No cervical adenopathy.  Skin:    Findings: No erythema or rash.  Neurological:     Mental Status: She is oriented to person, place, and time.     Cranial Nerves: No cranial nerve deficit.     Motor: No abnormal muscle tone.     Coordination: Coordination normal.     Deep Tendon Reflexes: Reflexes normal.  Psychiatric:        Behavior: Behavior normal.        Thought Content: Thought content normal.        Judgment: Judgment normal.     Lab Results  Component Value Date   WBC 5.9 06/08/2019   HGB 14.3 06/08/2019   HCT 43.9 06/08/2019   PLT 260.0 06/08/2019   GLUCOSE 90 06/08/2019   CHOL 195 06/08/2019   TRIG 68.0 06/08/2019   HDL 88.60 06/08/2019   LDLDIRECT 107.6 08/15/2012   LDLCALC 93 06/08/2019   ALT 20 06/08/2019   AST 41 (H)  06/08/2019   NA 137 06/08/2019   K 4.1 06/08/2019   CL 100 06/08/2019   CREATININE 0.68 06/08/2019   BUN 12 06/08/2019   CO2 29 06/08/2019   TSH 3.47 06/08/2019    DG ESOPHAGUS W DOUBLE CM (HD)  Result Date: 05/09/2020 CLINICAL DATA:  Dysphagia, with pills sticking in the throat and nonproductive cough. EXAM: ESOPHOGRAM / BARIUM SWALLOW / BARIUM TABLET STUDY TECHNIQUE: Combined double contrast and single contrast examination performed using effervescent crystals, thick barium liquid, and thin barium liquid. The patient was observed with fluoroscopy swallowing a 13 mm barium sulphate tablet. FLUOROSCOPY TIME:  Fluoroscopy Time:  2 minutes 54 seconds Number of Acquired Spot Images: 7 COMPARISON:  None. FINDINGS: No evidence of laryngeal penetration or tracheobronchial aspiration. Mild barium retention in the vallecula. No evidence of pharyngeal mass or stricture. No evidence of cricopharyngeus muscle dysfunction. The barium tablet became transiently lodged in the vallecula, with normal clearance of the barium tablet into the esophagus and stomach after swallowing thick barium. No hiatal hernia. No gastroesophageal reflux elicited despite provocative maneuvers including water siphon test. Normal esophageal mucosa. Normal esophageal distensibility, with no evidence of esophageal mass, stricture or ulcer. Mild esophageal dysmotility characterized by proximal escape. IMPRESSION: 1. Mild barium retention in the vallecula. Barium tablet became transiently lodged in the vallecula, with normal clearance of the tablet into the esophagus and stomach after swallowing thick barium. No laryngeal penetration or tracheobronchial aspiration. Consider speech/swallow pathology consultation. 2. Mild esophageal dysmotility, favor presbyesophagus. 3. No hiatal hernia. No gastroesophageal reflux elicited. No evidence of esophageal mass or stricture. Electronically Signed   By: Ilona Sorrel M.D.   On: 05/09/2020 10:50     Assessment & Plan:   There are no diagnoses linked to this encounter.   No orders of the defined types were placed in this encounter.    Follow-up: No follow-ups on file.  Walker Kehr, MD

## 2020-06-20 NOTE — Patient Instructions (Signed)
You can try Lion's Mane Mushroom extract or capsules for memory   

## 2020-06-21 LAB — URINALYSIS
Bilirubin Urine: NEGATIVE
Glucose, UA: NEGATIVE
Hgb urine dipstick: NEGATIVE
Leukocytes,Ua: NEGATIVE
Nitrite: NEGATIVE
Protein, ur: NEGATIVE
Specific Gravity, Urine: 1.005 (ref 1.001–1.03)
pH: 6 (ref 5.0–8.0)

## 2020-06-21 LAB — CBC WITH DIFFERENTIAL/PLATELET
Absolute Monocytes: 713 cells/uL (ref 200–950)
Basophils Absolute: 44 cells/uL (ref 0–200)
Basophils Relative: 0.5 %
Eosinophils Absolute: 9 cells/uL — ABNORMAL LOW (ref 15–500)
Eosinophils Relative: 0.1 %
HCT: 44.3 % (ref 35.0–45.0)
Hemoglobin: 14.4 g/dL (ref 11.7–15.5)
Lymphs Abs: 1276 cells/uL (ref 850–3900)
MCH: 30.2 pg (ref 27.0–33.0)
MCHC: 32.5 g/dL (ref 32.0–36.0)
MCV: 92.9 fL (ref 80.0–100.0)
MPV: 10.7 fL (ref 7.5–12.5)
Monocytes Relative: 8.1 %
Neutro Abs: 6758 cells/uL (ref 1500–7800)
Neutrophils Relative %: 76.8 %
Platelets: 212 10*3/uL (ref 140–400)
RBC: 4.77 10*6/uL (ref 3.80–5.10)
RDW: 11.9 % (ref 11.0–15.0)
Total Lymphocyte: 14.5 %
WBC: 8.8 10*3/uL (ref 3.8–10.8)

## 2020-06-21 LAB — LIPID PANEL
Cholesterol: 188 mg/dL (ref <200)
HDL: 98 mg/dL (ref >=50)
LDL Cholesterol (Calc): 76 mg/dL (calc)
Non-HDL Cholesterol (Calc): 90 mg/dL (calc) (ref <130)
Total CHOL/HDL Ratio: 1.9 (calc) (ref <5.0)
Triglycerides: 60 mg/dL (ref <150)

## 2020-06-21 LAB — TSH: TSH: 1.79 mIU/L (ref 0.40–4.50)

## 2020-06-22 LAB — COMPLETE METABOLIC PANEL WITH GFR
AG Ratio: 1.9 (calc) (ref 1.0–2.5)
ALT: 15 U/L (ref 6–29)
AST: 37 U/L — ABNORMAL HIGH (ref 10–35)
Albumin: 4.7 g/dL (ref 3.6–5.1)
Alkaline phosphatase (APISO): 94 U/L (ref 37–153)
BUN/Creatinine Ratio: 14 (calc) (ref 6–22)
BUN: 8 mg/dL (ref 7–25)
CO2: 22 mmol/L (ref 20–32)
Calcium: 9.8 mg/dL (ref 8.6–10.4)
Chloride: 99 mmol/L (ref 98–110)
Creat: 0.56 mg/dL — ABNORMAL LOW (ref 0.60–0.93)
GFR, Est African American: 106 mL/min/{1.73_m2} (ref >=60)
GFR, Est Non African American: 92 mL/min/{1.73_m2} (ref >=60)
Globulin: 2.5 g/dL (calc) (ref 1.9–3.7)
Glucose, Bld: 80 mg/dL (ref 65–99)
Potassium: 4.2 mmol/L (ref 3.5–5.3)
Sodium: 139 mmol/L (ref 135–146)
Total Bilirubin: 0.8 mg/dL (ref 0.2–1.2)
Total Protein: 7.2 g/dL (ref 6.1–8.1)

## 2020-06-24 ENCOUNTER — Other Ambulatory Visit: Payer: Self-pay

## 2020-06-24 ENCOUNTER — Ambulatory Visit (INDEPENDENT_AMBULATORY_CARE_PROVIDER_SITE_OTHER)
Admission: RE | Admit: 2020-06-24 | Discharge: 2020-06-24 | Disposition: A | Discharge status: Home / Self Care | Payer: PPO | Source: Ambulatory Visit | Attending: Internal Medicine | Admitting: Internal Medicine

## 2020-06-24 DIAGNOSIS — R059 Cough, unspecified: Secondary | ICD-10-CM | POA: Diagnosis not present

## 2020-06-24 DIAGNOSIS — I700 Atherosclerosis of aorta: Secondary | ICD-10-CM | POA: Diagnosis not present

## 2020-07-23 ENCOUNTER — Other Ambulatory Visit: Payer: Self-pay | Admitting: Internal Medicine

## 2020-07-23 DIAGNOSIS — Z1231 Encounter for screening mammogram for malignant neoplasm of breast: Secondary | ICD-10-CM

## 2020-08-19 DIAGNOSIS — H524 Presbyopia: Secondary | ICD-10-CM | POA: Diagnosis not present

## 2020-08-19 DIAGNOSIS — H04123 Dry eye syndrome of bilateral lacrimal glands: Secondary | ICD-10-CM | POA: Diagnosis not present

## 2020-08-19 DIAGNOSIS — H43813 Vitreous degeneration, bilateral: Secondary | ICD-10-CM | POA: Diagnosis not present

## 2020-08-19 DIAGNOSIS — H25813 Combined forms of age-related cataract, bilateral: Secondary | ICD-10-CM | POA: Diagnosis not present

## 2020-08-30 ENCOUNTER — Other Ambulatory Visit: Payer: Self-pay

## 2020-08-30 ENCOUNTER — Ambulatory Visit
Admission: RE | Admit: 2020-08-30 | Discharge: 2020-08-30 | Disposition: A | Discharge status: Home / Self Care | Payer: PPO | Source: Ambulatory Visit | Attending: Internal Medicine | Admitting: Internal Medicine

## 2020-08-30 DIAGNOSIS — Z1231 Encounter for screening mammogram for malignant neoplasm of breast: Secondary | ICD-10-CM

## 2020-09-24 ENCOUNTER — Ambulatory Visit: Payer: PPO | Admitting: Internal Medicine

## 2020-09-26 ENCOUNTER — Ambulatory Visit (INDEPENDENT_AMBULATORY_CARE_PROVIDER_SITE_OTHER): Payer: PPO | Admitting: Internal Medicine

## 2020-09-26 ENCOUNTER — Encounter: Payer: Self-pay | Admitting: Internal Medicine

## 2020-09-26 ENCOUNTER — Other Ambulatory Visit: Payer: Self-pay

## 2020-09-26 DIAGNOSIS — I2510 Atherosclerotic heart disease of native coronary artery without angina pectoris: Secondary | ICD-10-CM | POA: Diagnosis not present

## 2020-09-26 DIAGNOSIS — R209 Unspecified disturbances of skin sensation: Secondary | ICD-10-CM | POA: Diagnosis not present

## 2020-09-26 DIAGNOSIS — R059 Cough, unspecified: Secondary | ICD-10-CM

## 2020-09-26 DIAGNOSIS — I2583 Coronary atherosclerosis due to lipid rich plaque: Secondary | ICD-10-CM

## 2020-09-26 MED ORDER — METHYLPREDNISOLONE 4 MG PO TBPK: ORAL_TABLET | ORAL | 0 refills | Status: DC

## 2020-09-26 MED ORDER — TRELEGY ELLIPTA 100-62.5-25 MCG/INH IN AEPB: INHALATION_SPRAY | RESPIRATORY_TRACT | 11 refills | Status: DC

## 2020-09-26 NOTE — Assessment & Plan Note (Signed)
Try Lion's mane 

## 2020-09-26 NOTE — Assessment & Plan Note (Addendum)
On fish oil.  We discussed fish oil use and cough.  There was no connection of the past for the patient. Previously she was not interested in using statins

## 2020-09-26 NOTE — Progress Notes (Signed)
Subjective:  Patient ID: [name], female    DOB: [birth year]  Age: 75 y.o. MRN: [id number]  CC: Follow-up (3 month f/u)   HPI [name] presents for a lingering cough, worse w/bending over, wheezing  Outpatient Medications Prior to Visit  Medication Sig Dispense Refill  . cholecalciferol (VITAMIN D) 1000 UNITS tablet Take 2,000 Units by mouth daily.    . Multiple Vitamins-Minerals (MULTIVITAMIN ADULTS 50+) TABS     . Omega-3 Fatty Acids (FISH OIL) 1000 MG CAPS Take 1 capsule by mouth daily.    . methylPREDNISolone acetate (DEPO-MEDROL) injection 80 mg      No facility-administered medications prior to visit.    ROS: Review of Systems  Constitutional: Negative for activity change, appetite change, chills, diaphoresis, fatigue, fever and unexpected weight change.  HENT: Negative for congestion, mouth sores and sinus pressure.   Eyes: Negative for visual disturbance.  Respiratory: Positive for cough and wheezing. Negative for chest tightness.   Gastrointestinal: Negative for abdominal pain and nausea.  Genitourinary: Negative for difficulty urinating, frequency and vaginal pain.  Musculoskeletal: Negative for back pain and gait problem.  Skin: Negative for pallor and rash.  Neurological: Negative for dizziness, tremors, weakness, numbness and headaches.  Psychiatric/Behavioral: Negative for confusion and sleep disturbance.    Objective:  BP 102/72 (BP Location: Left Arm)   Pulse 93   Temp 97.7 F (36.5 C) (Oral)   Wt 92 lb (41.7 kg)   SpO2 95%   BMI 14.85 kg/m   BP Readings from Last 3 Encounters:  09/26/20 102/72  06/20/20 120/74  05/03/20 106/62    Wt Readings from Last 3 Encounters:  09/26/20 92 lb (41.7 kg)  06/20/20 91 lb (41.3 kg)  05/03/20 91 lb 9.6 oz (41.5 kg)    Physical Exam Constitutional:      General: She is not in acute distress.    Appearance: Normal appearance. [name].  HENT:     Head: Normocephalic.     Right  Ear: External ear normal.     Left Ear: External ear normal.     Nose: Nose normal.     Mouth/Throat:     Mouth: Oropharynx is clear and moist.  Eyes:     General:        Right eye: No discharge.        Left eye: No discharge.     Conjunctiva/sclera: Conjunctivae normal.     Pupils: Pupils are equal, round, and reactive to light.  Neck:     Thyroid: No thyromegaly.     Vascular: No JVD.     Trachea: No tracheal deviation.  Cardiovascular:     Rate and Rhythm: Normal rate and regular rhythm.     Heart sounds: Normal heart sounds.  Pulmonary:     Effort: No respiratory distress.     Breath sounds: No stridor. No wheezing.  Abdominal:     General: Bowel sounds are normal. There is no distension.     Palpations: Abdomen is soft. There is no mass.     Tenderness: There is no abdominal tenderness. There is no guarding or rebound.  Musculoskeletal:        General: No tenderness or edema.     Cervical back: Normal range of motion and neck supple.  Lymphadenopathy:     Cervical: No cervical adenopathy.  Skin:    Findings: No erythema or rash.  Neurological:     Cranial Nerves: No cranial nerve deficit.  Motor: No abnormal muscle tone.     Coordination: Coordination normal.     Deep Tendon Reflexes: Reflexes normal.  Psychiatric:        Mood and Affect: Mood and affect normal.        Behavior: Behavior normal.        Thought Content: Thought content normal.        Judgment: Judgment normal.    thin   I personally provided Trelegy inhaler use teaching. After the teaching patient was able to demonstrate it's use effectively. All questions were answered   Lab Results  Component Value Date   WBC 8.8 06/20/2020   HGB 14.4 06/20/2020   HCT 44.3 06/20/2020   PLT 212 06/20/2020   GLUCOSE 80 06/20/2020   CHOL 188 06/20/2020   TRIG 60 06/20/2020   HDL 98 06/20/2020   LDLDIRECT 107.6 08/15/2012   LDLCALC 76 06/20/2020   ALT 15 06/20/2020   AST 37 (H) 06/20/2020   NA 139  06/20/2020   K 4.2 06/20/2020   CL 99 06/20/2020   CREATININE 0.56 (L) 06/20/2020   BUN 8 06/20/2020   CO2 22 06/20/2020   TSH 1.79 06/20/2020    MM 3D SCREEN BREAST BILATERAL  Result Date: 09/03/2020 CLINICAL DATA:  Screening. EXAM: DIGITAL SCREENING BILATERAL MAMMOGRAM WITH TOMO AND CAD COMPARISON:  Previous exam(s). ACR Breast Density Category d: The breast tissue is extremely dense, which lowers the sensitivity of mammography FINDINGS: There are no findings suspicious for malignancy. Images were processed with CAD. IMPRESSION: No mammographic evidence of malignancy. A result letter of this screening mammogram will be mailed directly to the patient. RECOMMENDATION: Screening mammogram in one year. (Code:SM-B-01Y) BI-RADS CATEGORY  1: Negative. Electronically Signed   By: Sande Brothers M.D.   On: 09/03/2020 13:36    Assessment & Plan:    Sonda Primes, MD

## 2020-09-26 NOTE — Assessment & Plan Note (Addendum)
Not better Try Trelegy -teaching provided (as we had samples) Medrol pack with Trelegy did not help.  There was some improvement 10 minutes after use Trelegy. Call if problems

## 2020-11-26 ENCOUNTER — Ambulatory Visit (INDEPENDENT_AMBULATORY_CARE_PROVIDER_SITE_OTHER): Payer: PPO | Admitting: Internal Medicine

## 2020-11-26 ENCOUNTER — Encounter: Payer: Self-pay | Admitting: Internal Medicine

## 2020-11-26 ENCOUNTER — Other Ambulatory Visit: Payer: Self-pay

## 2020-11-26 DIAGNOSIS — I2510 Atherosclerotic heart disease of native coronary artery without angina pectoris: Secondary | ICD-10-CM

## 2020-11-26 DIAGNOSIS — I2583 Coronary atherosclerosis due to lipid rich plaque: Secondary | ICD-10-CM | POA: Diagnosis not present

## 2020-11-26 DIAGNOSIS — R059 Cough, unspecified: Secondary | ICD-10-CM | POA: Diagnosis not present

## 2020-11-26 NOTE — Progress Notes (Signed)
Subjective:  Patient ID: [name], female    DOB: May 19, 1946  Age: 75 y.o. MRN: [id number]  CC: Follow-up (2 month f/u)   HPI TRUC WINFREE presents for chronic cough - much better w/some relapses  Outpatient Medications Prior to Visit  Medication Sig Dispense Refill  . Ascorbic Acid (VITAMIN C) 500 MG CAPS Take 1 capsule by mouth daily    . cholecalciferol (VITAMIN D) 1000 UNITS tablet Take 2,000 Units by mouth daily.    . Fluticasone-Umeclidin-Vilant (TRELEGY ELLIPTA) 100-62.5-25 MCG/INH AEPB 1 inh qd 1 each 11  . Multiple Vitamins-Minerals (MULTIVITAMIN ADULTS 50+) TABS     . Omega-3 Fatty Acids (FISH OIL) 1000 MG CAPS Take 1 capsule by mouth daily.    . methylPREDNISolone (MEDROL DOSEPAK) 4 MG TBPK tablet As directed 21 tablet 0   No facility-administered medications prior to visit.    ROS: Review of Systems  Constitutional: Negative for activity change, appetite change, chills, fatigue and unexpected weight change.  HENT: Negative for congestion, mouth sores and sinus pressure.   Eyes: Negative for visual disturbance.  Respiratory: Positive for cough. Negative for chest tightness, shortness of breath and wheezing.   Gastrointestinal: Negative for abdominal pain and nausea.  Genitourinary: Negative for difficulty urinating, frequency and vaginal pain.  Musculoskeletal: Negative for back pain and gait problem.  Skin: Negative for pallor and rash.  Neurological: Negative for dizziness, tremors, weakness, numbness and headaches.  Psychiatric/Behavioral: Negative for confusion, sleep disturbance and suicidal ideas.    Objective:  BP 102/64 (BP Location: Left Arm)   Pulse 71   Temp 98.2 F (36.8 C) (Oral)   Ht 5\' 6"  (1.676 m)   Wt 94 lb 9.6 oz (42.9 kg)   SpO2 99%   BMI 15.27 kg/m   BP Readings from Last 3 Encounters:  11/26/20 102/64  09/26/20 102/72  06/20/20 120/74    Wt Readings from Last 3 Encounters:  11/26/20 94 lb 9.6 oz (42.9 kg)  09/26/20 92  lb (41.7 kg)  06/20/20 91 lb (41.3 kg)    Physical Exam Constitutional:      General: She is not in acute distress.    Appearance: [name].  HENT:     Head: Normocephalic.     Right Ear: External ear normal.     Left Ear: External ear normal.     Nose: Nose normal.     Mouth/Throat:     Mouth: Oropharynx is clear and moist.  Eyes:     General:        Right eye: No discharge.        Left eye: No discharge.     Conjunctiva/sclera: Conjunctivae normal.     Pupils: Pupils are equal, round, and reactive to light.  Neck:     Thyroid: No thyromegaly.     Vascular: No JVD.     Trachea: No tracheal deviation.  Cardiovascular:     Rate and Rhythm: Normal rate and regular rhythm.     Heart sounds: Normal heart sounds.  Pulmonary:     Effort: No respiratory distress.     Breath sounds: No stridor. No wheezing.  Abdominal:     General: Bowel sounds are normal. There is no distension.     Palpations: Abdomen is soft. There is no mass.     Tenderness: There is no abdominal tenderness. There is no guarding or rebound.  Musculoskeletal:        General: No tenderness or edema.  Cervical back: Normal range of motion and neck supple.  Lymphadenopathy:     Cervical: No cervical adenopathy.  Skin:    Findings: No erythema or rash.  Neurological:     Cranial Nerves: No cranial nerve deficit.     Motor: No abnormal muscle tone.     Coordination: Coordination normal.     Deep Tendon Reflexes: Reflexes normal.  Psychiatric:        Mood and Affect: Mood and affect normal.        Behavior: Behavior normal.        Thought Content: Thought content normal.        Judgment: Judgment normal.     Lab Results  Component Value Date   WBC 8.8 06/20/2020   HGB 14.4 06/20/2020   HCT 44.3 06/20/2020   PLT 212 06/20/2020   GLUCOSE 80 06/20/2020   CHOL 188 06/20/2020   TRIG 60 06/20/2020   HDL 98 06/20/2020   LDLDIRECT 107.6 08/15/2012   LDLCALC 76 06/20/2020   ALT 15  06/20/2020   AST 37 (H) 06/20/2020   NA 139 06/20/2020   K 4.2 06/20/2020   CL 99 06/20/2020   CREATININE 0.56 (L) 06/20/2020   BUN 8 06/20/2020   CO2 22 06/20/2020   TSH 1.79 06/20/2020    MM 3D SCREEN BREAST BILATERAL  Result Date: 09/03/2020 CLINICAL DATA:  Screening. EXAM: DIGITAL SCREENING BILATERAL MAMMOGRAM WITH TOMO AND CAD COMPARISON:  Previous exam(s). ACR Breast Density Category d: The breast tissue is extremely dense, which lowers the sensitivity of mammography FINDINGS: There are no findings suspicious for malignancy. Images were processed with CAD. IMPRESSION: No mammographic evidence of malignancy. A result letter of this screening mammogram will be mailed directly to the patient. RECOMMENDATION: Screening mammogram in one year. (Code:SM-B-01Y) BI-RADS CATEGORY  1: Negative. Electronically Signed   By: Kristopher Oppenheim M.D.   On: 09/03/2020 13:36    Assessment & Plan:    Walker Kehr, MD

## 2020-11-26 NOTE — Assessment & Plan Note (Addendum)
Trelegy helped a lot Cont w/Trelegy Try Claritin prn

## 2020-11-26 NOTE — Assessment & Plan Note (Addendum)
A minimal degree of coronary artery disease.  Coronary CT calcium score is 4. No CP Fish oil if tolerated

## 2020-12-05 ENCOUNTER — Telehealth: Payer: Self-pay | Admitting: Internal Medicine

## 2020-12-05 NOTE — Telephone Encounter (Signed)
LVM for pt to rtn my call to schedule AWV with [facility]. Please schedule appt if pt calls the office.  

## 2020-12-23 ENCOUNTER — Telehealth: Payer: Self-pay | Admitting: Internal Medicine

## 2020-12-23 NOTE — Telephone Encounter (Signed)
LVM for pt to rtn my call to schedule awv with [facility].. Please schedule AWV if pt calls the office.  

## 2021-03-03 ENCOUNTER — Telehealth: Payer: Self-pay

## 2021-03-03 ENCOUNTER — Other Ambulatory Visit: Payer: Self-pay

## 2021-03-03 DIAGNOSIS — M8580 Other specified disorders of bone density and structure, unspecified site: Secondary | ICD-10-CM

## 2021-03-03 NOTE — Telephone Encounter (Signed)
Patient called to inquire when her BD test is due. I called her back and left message on voice mail and advised her time to schedule is now.  Order has been placed and she can call appt desk to schedule.  (Her last one was 11/17/18 and at her [facility] last year Dr. Delilah Shan wrote that she should have test in 2022. )

## 2021-03-18 ENCOUNTER — Other Ambulatory Visit: Payer: Self-pay

## 2021-03-18 ENCOUNTER — Other Ambulatory Visit: Payer: Self-pay | Admitting: Obstetrics & Gynecology

## 2021-03-18 ENCOUNTER — Ambulatory Visit (INDEPENDENT_AMBULATORY_CARE_PROVIDER_SITE_OTHER): Payer: PPO

## 2021-03-18 DIAGNOSIS — Z780 Asymptomatic menopausal state: Secondary | ICD-10-CM | POA: Diagnosis not present

## 2021-03-18 DIAGNOSIS — M810 Age-related osteoporosis without current pathological fracture: Secondary | ICD-10-CM

## 2021-03-18 DIAGNOSIS — M8589 Other specified disorders of bone density and structure, multiple sites: Secondary | ICD-10-CM

## 2021-03-18 DIAGNOSIS — M8580 Other specified disorders of bone density and structure, unspecified site: Secondary | ICD-10-CM

## 2021-03-27 ENCOUNTER — Telehealth: Payer: Self-pay

## 2021-03-27 NOTE — Chronic Care Management (AMB) (Signed)
Called and left message for patient explaining CCM services and offering to schedule appointment if interested  Left clinic number for patient to return call if interested  Tomasa Blase, PharmD Clinical Pharmacist, [place]  03/27/21 11:43 AM

## 2021-04-02 ENCOUNTER — Telehealth: Payer: Self-pay

## 2021-04-02 NOTE — Chronic Care Management (AMB) (Signed)
Offered for patient to schedule and follow with CCM services, declined at this time   Tomasa Blase, PharmD Clinical Pharmacist, [place]

## 2021-06-23 ENCOUNTER — Other Ambulatory Visit: Payer: Self-pay

## 2021-06-23 ENCOUNTER — Encounter: Payer: Self-pay | Admitting: Internal Medicine

## 2021-06-23 ENCOUNTER — Ambulatory Visit (INDEPENDENT_AMBULATORY_CARE_PROVIDER_SITE_OTHER): Payer: PPO | Admitting: Internal Medicine

## 2021-06-23 VITALS — BP 112/62 | HR 66 | Temp 97.8°F | Resp 18 | Ht 66.0 in | Wt 92.4 lb

## 2021-06-23 DIAGNOSIS — R053 Chronic cough: Secondary | ICD-10-CM

## 2021-06-23 DIAGNOSIS — K224 Dyskinesia of esophagus: Secondary | ICD-10-CM | POA: Diagnosis not present

## 2021-06-23 DIAGNOSIS — R252 Cramp and spasm: Secondary | ICD-10-CM | POA: Diagnosis not present

## 2021-06-23 DIAGNOSIS — Z23 Encounter for immunization: Secondary | ICD-10-CM | POA: Diagnosis not present

## 2021-06-23 MED ORDER — FAMOTIDINE 20 MG PO TABS: 20.0000 mg | ORAL_TABLET | Freq: Two times a day (BID) | ORAL | 3 refills | Status: DC

## 2021-06-23 MED ORDER — SHINGRIX 50 MCG/0.5ML IM SUSR: 0.5000 mL | Freq: Once | INTRAMUSCULAR | 1 refills | Status: AC

## 2021-06-23 NOTE — Assessment & Plan Note (Signed)
Mild esophageal dysmotility, favor presbyesophagus on X ray 2021 Try Famotidine

## 2021-06-23 NOTE — Assessment & Plan Note (Signed)
Try Magnesium spray

## 2021-06-23 NOTE — Addendum Note (Signed)
Addended by: Thomes Cake on: 06/23/2021 09:19 AM   Modules accepted: Orders

## 2021-06-23 NOTE — Assessment & Plan Note (Addendum)
Worse again Trelegy helped, oral steroids - we can repeat Try Famotidine po for possible GERD Allergy ref was offered Cough is worse now. It can be triggered by bending over Mild esophageal dysmotility, favor presbyesophagus on X ray

## 2021-06-23 NOTE — Patient Instructions (Addendum)
Try Magnesium oil spray for cramps  Spenco inserts  Stop fish oil  Mild esophageal dysmotility, favor presbyesophagus on X ray 2021 Try Famotidine

## 2021-06-23 NOTE — Progress Notes (Signed)
Subjective:  Patient ID: [name], female    DOB: 07/27/1946  Age: 75 y.o. MRN: [id number]  CC: Annual Exam (No concerns.)   HPI [name] presents for chronic cough Cough is worse now. It can be triggered by bending over  Outpatient Medications Prior to Visit  Medication Sig Dispense Refill   Ascorbic Acid (VITAMIN C) 500 MG CAPS Take 1 capsule by mouth daily     cholecalciferol (VITAMIN D) 1000 UNITS tablet Take 2,000 Units by mouth daily.     Fluticasone-Umeclidin-Vilant (TRELEGY ELLIPTA) 100-62.5-25 MCG/INH AEPB 1 inh qd 1 each 11   Multiple Vitamins-Minerals (MULTIVITAMIN ADULTS 50+) TABS      Omega-3 Fatty Acids (FISH OIL) 1000 MG CAPS Take 1 capsule by mouth daily.     No facility-administered medications prior to visit.    ROS: Review of Systems  Constitutional:  Negative for activity change, appetite change, chills, fatigue and unexpected weight change.  HENT:  Negative for congestion, mouth sores and sinus pressure.   Eyes:  Negative for visual disturbance.  Respiratory:  Positive for cough. Negative for chest tightness.   Gastrointestinal:  Negative for abdominal pain and nausea.  Genitourinary:  Negative for difficulty urinating, frequency and vaginal pain.  Musculoskeletal:  Negative for back pain and gait problem.  Skin:  Negative for pallor and rash.  Neurological:  Negative for dizziness, tremors, weakness, numbness and headaches.  Psychiatric/Behavioral:  Negative for confusion, sleep disturbance and suicidal ideas.    Objective:  BP 112/62   Pulse 66   Temp 97.8 F (36.6 C) (Oral)   Resp 18   Ht 5\' 6"  (1.676 m)   Wt 92 lb 6.4 oz (41.9 kg)   SpO2 99%   BMI 14.91 kg/m   BP Readings from Last 3 Encounters:  06/23/21 112/62  11/26/20 102/64  09/26/20 102/72    Wt Readings from Last 3 Encounters:  06/23/21 92 lb 6.4 oz (41.9 kg)  11/26/20 94 lb 9.6 oz (42.9 kg)  09/26/20 92 lb (41.7 kg)    Physical Exam Constitutional:       General: She is not in acute distress.    Appearance: [name].  HENT:     Head: Normocephalic.     Right Ear: External ear normal.     Left Ear: External ear normal.     Nose: Nose normal.  Eyes:     General:        Right eye: No discharge.        Left eye: No discharge.     Conjunctiva/sclera: Conjunctivae normal.     Pupils: Pupils are equal, round, and reactive to light.  Neck:     Thyroid: No thyromegaly.     Vascular: No JVD.     Trachea: No tracheal deviation.  Cardiovascular:     Rate and Rhythm: Normal rate and regular rhythm.     Heart sounds: Normal heart sounds.  Pulmonary:     Effort: No respiratory distress.     Breath sounds: No stridor. No wheezing.  Abdominal:     General: Bowel sounds are normal. There is no distension.     Palpations: Abdomen is soft. There is no mass.     Tenderness: There is no abdominal tenderness. There is no guarding or rebound.  Musculoskeletal:        General: No tenderness.     Cervical back: Normal range of motion and neck supple. No rigidity.  Lymphadenopathy:  Cervical: No cervical adenopathy.  Skin:    Findings: No erythema or rash.  Neurological:     Cranial Nerves: No cranial nerve deficit.     Motor: No abnormal muscle tone.     Coordination: Coordination normal.     Deep Tendon Reflexes: Reflexes normal.  Psychiatric:        Behavior: Behavior normal.        Thought Content: Thought content normal.        Judgment: Judgment normal.    Lab Results  Component Value Date   WBC 8.8 06/20/2020   HGB 14.4 06/20/2020   HCT 44.3 06/20/2020   PLT 212 06/20/2020   GLUCOSE 80 06/20/2020   CHOL 188 06/20/2020   TRIG 60 06/20/2020   HDL 98 06/20/2020   LDLDIRECT 107.6 08/15/2012   LDLCALC 76 06/20/2020   ALT 15 06/20/2020   AST 37 (H) 06/20/2020   NA 139 06/20/2020   K 4.2 06/20/2020   CL 99 06/20/2020   CREATININE 0.56 (L) 06/20/2020   BUN 8 06/20/2020   CO2 22 06/20/2020   TSH 1.79 06/20/2020     MM 3D SCREEN BREAST BILATERAL  Result Date: 09/03/2020 CLINICAL DATA:  Screening. EXAM: DIGITAL SCREENING BILATERAL MAMMOGRAM WITH TOMO AND CAD COMPARISON:  Previous exam(s). ACR Breast Density Category d: The breast tissue is extremely dense, which lowers the sensitivity of mammography FINDINGS: There are no findings suspicious for malignancy. Images were processed with CAD. IMPRESSION: No mammographic evidence of malignancy. A result letter of this screening mammogram will be mailed directly to the patient. RECOMMENDATION: Screening mammogram in one year. (Code:SM-B-01Y) BI-RADS CATEGORY  1: Negative. Electronically Signed   By: Kristopher Oppenheim M.D.   On: 09/03/2020 13:36    Assessment & Plan:   Problem List Items Addressed This Visit     Cough    Worse again Trelegy helped, oral steroids - we can repeat Try Famotidine po for possible GERD Allergy ref was offered Cough is worse now. It can be triggered by bending over Mild esophageal dysmotility, favor presbyesophagus on X ray      Esophageal motility disorder    Mild esophageal dysmotility, favor presbyesophagus on X ray 2021 Try Famotidine      Leg cramps    Try Magnesium spray         Follow-up: Return in about 6 months (around 12/22/2021) for Wellness Exam.  Walker Kehr, MD

## 2021-07-28 ENCOUNTER — Other Ambulatory Visit: Payer: Self-pay | Admitting: Internal Medicine

## 2021-07-28 DIAGNOSIS — Z1231 Encounter for screening mammogram for malignant neoplasm of breast: Secondary | ICD-10-CM

## 2021-08-22 DIAGNOSIS — H5203 Hypermetropia, bilateral: Secondary | ICD-10-CM | POA: Diagnosis not present

## 2021-08-22 DIAGNOSIS — H25013 Cortical age-related cataract, bilateral: Secondary | ICD-10-CM | POA: Diagnosis not present

## 2021-09-01 ENCOUNTER — Ambulatory Visit
Admission: RE | Admit: 2021-09-01 | Discharge: 2021-09-01 | Disposition: A | Discharge status: Home / Self Care | Payer: PPO | Source: Ambulatory Visit | Attending: Internal Medicine | Admitting: Internal Medicine

## 2021-09-01 DIAGNOSIS — Z1231 Encounter for screening mammogram for malignant neoplasm of breast: Secondary | ICD-10-CM

## 2021-09-02 ENCOUNTER — Other Ambulatory Visit: Payer: Self-pay | Admitting: Internal Medicine

## 2021-09-02 DIAGNOSIS — R928 Other abnormal and inconclusive findings on diagnostic imaging of breast: Secondary | ICD-10-CM

## 2021-09-29 ENCOUNTER — Ambulatory Visit
Admission: RE | Admit: 2021-09-29 | Discharge: 2021-09-29 | Disposition: A | Discharge status: Home / Self Care | Payer: PPO | Source: Ambulatory Visit | Attending: Internal Medicine | Admitting: Internal Medicine

## 2021-09-29 DIAGNOSIS — R928 Other abnormal and inconclusive findings on diagnostic imaging of breast: Secondary | ICD-10-CM

## 2021-09-29 DIAGNOSIS — R922 Inconclusive mammogram: Secondary | ICD-10-CM | POA: Diagnosis not present

## 2021-10-13 ENCOUNTER — Other Ambulatory Visit: Payer: PPO

## 2021-12-01 ENCOUNTER — Other Ambulatory Visit: Payer: Self-pay

## 2021-12-01 ENCOUNTER — Ambulatory Visit: Payer: PPO | Admitting: Dermatology

## 2021-12-01 DIAGNOSIS — L570 Actinic keratosis: Secondary | ICD-10-CM

## 2021-12-01 DIAGNOSIS — D1801 Hemangioma of skin and subcutaneous tissue: Secondary | ICD-10-CM | POA: Diagnosis not present

## 2021-12-01 DIAGNOSIS — L821 Other seborrheic keratosis: Secondary | ICD-10-CM | POA: Diagnosis not present

## 2021-12-12 ENCOUNTER — Encounter: Payer: Self-pay | Admitting: Dermatology

## 2021-12-12 NOTE — Progress Notes (Signed)
? ?  Follow-Up Visit ?  ?Subjective  ?[name] is a 76 y.o. female who presents for the following: Skin Problem (Spot above the lip on the R side. Scabs up, falls off and it returns ). ? ?Skin check, crust above lip ?Location:  ?Duration:  ?Quality:  ?Associated Signs/Symptoms: ?Modifying Factors:  ?Severity:  ?Timing: ?Context:  ? ?Objective  ?Well appearing patient in no apparent distress; mood and affect are within normal limits. ?Mid Back ?Several 1 mm smooth red dermal papules ? ?Left Popliteal Fossa, Right Upper Back ?Several white to brown textured flattopped papules, compatible dermoscopy ? ?Right Upper Cutaneous Lip ?Subtle pink 3 mm gritty crust ? ? ? ?A full examination was performed including scalp, head, eyes, ears, nose, lips, neck, chest, axillae, abdomen, back, buttocks, bilateral upper extremities, bilateral lower extremities, hands, feet, fingers, toes, fingernails, and toenails. All findings within normal limits unless otherwise noted below.  Areas beneath undergarments not fully examined ? ? ?Assessment & Plan  ? ? ?Cherry angioma ?Mid Back ? ?No intervention necessary ? ?Seborrheic keratosis (2) ?Left Popliteal Fossa; Right Upper Back ? ?Leave if stable ? ?Solar keratosis ?Right Upper Cutaneous Lip ? ?Return for biopsy if freezing fails ? ?Destruction of lesion - Right Upper Cutaneous Lip ?Complexity: simple   ?Destruction method: cryotherapy   ?Informed consent: discussed and consent obtained   ?Timeout:  patient name, date of birth, surgical site, and procedure verified ?Lesion destroyed using liquid nitrogen: Yes   ?Cryotherapy cycles:  5 ?Outcome: patient tolerated procedure well with no complications   ?Post-procedure details: wound care instructions given   ? ? ? ? ? ?I, Lavonna Monarch, MD, have reviewed all documentation for this visit.  The documentation on 12/12/21 for the exam, diagnosis, procedures, and orders are all accurate and complete. ?

## 2021-12-22 ENCOUNTER — Ambulatory Visit: Payer: PPO | Admitting: Internal Medicine

## 2022-01-07 ENCOUNTER — Encounter: Payer: Self-pay | Admitting: Internal Medicine

## 2022-01-07 ENCOUNTER — Ambulatory Visit (INDEPENDENT_AMBULATORY_CARE_PROVIDER_SITE_OTHER): Payer: PPO | Admitting: Internal Medicine

## 2022-01-07 VITALS — BP 122/70 | HR 74 | Temp 98.2°F | Ht 66.0 in | Wt 93.0 lb

## 2022-01-07 DIAGNOSIS — Z0000 Encounter for general adult medical examination without abnormal findings: Secondary | ICD-10-CM

## 2022-01-07 DIAGNOSIS — R319 Hematuria, unspecified: Secondary | ICD-10-CM

## 2022-01-07 DIAGNOSIS — R053 Chronic cough: Secondary | ICD-10-CM | POA: Diagnosis not present

## 2022-01-07 DIAGNOSIS — R748 Abnormal levels of other serum enzymes: Secondary | ICD-10-CM | POA: Diagnosis not present

## 2022-01-07 LAB — LIPID PANEL
Cholesterol: 185 mg/dL (ref 0–200)
HDL: 89.6 mg/dL (ref >39.00)
LDL Cholesterol: 84 mg/dL (ref 0–99)
NonHDL: 95.54
Total CHOL/HDL Ratio: 2
Triglycerides: 59 mg/dL (ref 0.0–149.0)
VLDL: 11.8 mg/dL (ref 0.0–40.0)

## 2022-01-07 LAB — CBC WITH DIFFERENTIAL/PLATELET
Basophils Absolute: 0 10*3/uL (ref 0.0–0.1)
Basophils Relative: 0.4 % (ref 0.0–3.0)
Eosinophils Absolute: 0.2 10*3/uL (ref 0.0–0.7)
Eosinophils Relative: 2.1 % (ref 0.0–5.0)
HCT: 41 % (ref 36.0–46.0)
Hemoglobin: 13.5 g/dL (ref 12.0–15.0)
Lymphocytes Relative: 17.6 % (ref 12.0–46.0)
Lymphs Abs: 1.6 10*3/uL (ref 0.7–4.0)
MCHC: 32.8 g/dL (ref 30.0–36.0)
MCV: 94.3 fl (ref 78.0–100.0)
Monocytes Absolute: 0.9 10*3/uL (ref 0.1–1.0)
Monocytes Relative: 10.6 % (ref 3.0–12.0)
Neutro Abs: 6.1 10*3/uL (ref 1.4–7.7)
Neutrophils Relative %: 69.3 % (ref 43.0–77.0)
Platelets: 328 10*3/uL (ref 150.0–400.0)
RBC: 4.35 Mil/uL (ref 3.87–5.11)
RDW: 12.4 % (ref 11.5–15.5)
WBC: 8.9 10*3/uL (ref 4.0–10.5)

## 2022-01-07 LAB — URINALYSIS
Bilirubin Urine: NEGATIVE
Hgb urine dipstick: NEGATIVE
Ketones, ur: NEGATIVE
Leukocytes,Ua: NEGATIVE
Nitrite: NEGATIVE
Specific Gravity, Urine: <=1.005 — AB (ref 1.000–1.030)
Total Protein, Urine: NEGATIVE
Urine Glucose: NEGATIVE
Urobilinogen, UA: 0.2 (ref 0.0–1.0)
pH: 6.5 (ref 5.0–8.0)

## 2022-01-07 LAB — COMPREHENSIVE METABOLIC PANEL WITH GFR
ALT: 19 U/L (ref 0–35)
AST: 47 U/L — ABNORMAL HIGH (ref 0–37)
Albumin: 4.5 g/dL (ref 3.5–5.2)
Alkaline Phosphatase: 85 U/L (ref 39–117)
BUN: 10 mg/dL (ref 6–23)
CO2: 29 meq/L (ref 19–32)
Calcium: 9.7 mg/dL (ref 8.4–10.5)
Chloride: 100 meq/L (ref 96–112)
Creatinine, Ser: 0.61 mg/dL (ref 0.40–1.20)
GFR: 86.94 mL/min
Glucose, Bld: 81 mg/dL (ref 70–99)
Potassium: 4.2 meq/L (ref 3.5–5.1)
Sodium: 139 meq/L (ref 135–145)
Total Bilirubin: 0.9 mg/dL (ref 0.2–1.2)
Total Protein: 7.3 g/dL (ref 6.0–8.3)

## 2022-01-07 LAB — TSH: TSH: 2.08 u[IU]/mL (ref 0.35–5.50)

## 2022-01-07 NOTE — Patient Instructions (Signed)
Blue-Emu cream -- use 2-3 times a day ? ?

## 2022-01-07 NOTE — Assessment & Plan Note (Signed)
Gross x1 new ?UA ?Ref to Dr March Rummage, Urology ?

## 2022-01-07 NOTE — Progress Notes (Addendum)
? ?Subjective:  ?Patient ID: [name], female    DOB: October 08, 1945  Age: 76 y.o. MRN: [id number] ? ?CC: Follow-up (Still coughing, patient states it comes from taking medication) and Medication (Patient takes calcium 600 mg once per week) ? ? ?HPI ?[name] presents for chronic cough, OA pains after gardening ? ?Outpatient Medications Prior to Visit  ?Medication Sig Dispense Refill  ? Ascorbic Acid (VITAMIN C) 500 MG CAPS Take 1 capsule by mouth daily    ? cholecalciferol (VITAMIN D) 1000 UNITS tablet Take 2,000 Units by mouth daily.    ? Multiple Vitamins-Minerals (MULTIVITAMIN ADULTS 50+) TABS     ? Omega-3 Fatty Acids (FISH OIL) 1000 MG CAPS Take 1 capsule by mouth daily.    ? famotidine (PEPCID) 20 MG tablet Take 1 tablet (20 mg total) by mouth 2 (two) times daily. (Patient not taking: Reported on 12/01/2021) 30 tablet 3  ? Fluticasone-Umeclidin-Vilant (TRELEGY ELLIPTA) 100-62.5-25 MCG/INH AEPB 1 inh qd (Patient not taking: Reported on 12/01/2021) 1 each 11  ? ?No facility-administered medications prior to visit.  ? ? ?ROS: ?Review of Systems  ?Constitutional:  Negative for activity change, appetite change, chills, fatigue and unexpected weight change.  ?HENT:  Negative for congestion, mouth sores and sinus pressure.   ?Eyes:  Negative for visual disturbance.  ?Respiratory:  Positive for cough and shortness of breath. Negative for chest tightness and wheezing.   ?Gastrointestinal:  Negative for abdominal pain and nausea.  ?Endocrine: Negative for polyuria.  ?Genitourinary:  Positive for hematuria. Negative for difficulty urinating, dysuria, enuresis, flank pain, frequency, genital sores, urgency, vaginal bleeding and vaginal pain.  ?Musculoskeletal:  Negative for back pain and gait problem.  ?Skin:  Negative for pallor and rash.  ?Neurological:  Negative for dizziness, tremors, weakness, numbness and headaches.  ?Psychiatric/Behavioral:  Negative for confusion, sleep disturbance and suicidal ideas.    ? ?Objective:  ?BP 122/70 (BP Location: Left Arm, Patient Position: Sitting, Cuff Size: Normal)   Pulse 74   Temp 98.2 ?F (36.8 ?C) (Oral)   Ht '5\' 6"'$  (1.676 m)   Wt 93 lb (42.2 kg)   SpO2 98%   BMI 15.01 kg/m?  ? ?BP Readings from Last 3 Encounters:  ?01/07/22 122/70  ?06/23/21 112/62  ?11/26/20 102/64  ? ? ?Wt Readings from Last 3 Encounters:  ?01/07/22 93 lb (42.2 kg)  ?06/23/21 92 lb 6.4 oz (41.9 kg)  ?11/26/20 94 lb 9.6 oz (42.9 kg)  ? ? ?Physical Exam ?Constitutional:   ?   General: She is not in acute distress. ?   Appearance: Normal appearance. [name].  ?HENT:  ?   Head: Normocephalic.  ?   Right Ear: External ear normal.  ?   Left Ear: External ear normal.  ?   Nose: Nose normal.  ?Eyes:  ?   General:     ?   Right eye: No discharge.     ?   Left eye: No discharge.  ?   Conjunctiva/sclera: Conjunctivae normal.  ?   Pupils: Pupils are equal, round, and reactive to light.  ?Neck:  ?   Thyroid: No thyromegaly.  ?   Vascular: No JVD.  ?   Trachea: No tracheal deviation.  ?Cardiovascular:  ?   Rate and Rhythm: Normal rate and regular rhythm.  ?   Heart sounds: Normal heart sounds.  ?Pulmonary:  ?   Effort: No respiratory distress.  ?   Breath sounds: No stridor. No wheezing.  ?Abdominal:  ?  General: Bowel sounds are normal. There is no distension.  ?   Palpations: Abdomen is soft. There is no mass.  ?   Tenderness: There is no abdominal tenderness. There is no guarding or rebound.  ?Musculoskeletal:     ?   General: No tenderness.  ?   Cervical back: Normal range of motion and neck supple. No rigidity.  ?Lymphadenopathy:  ?   Cervical: No cervical adenopathy.  ?Skin: ?   Findings: No erythema or rash.  ?Neurological:  ?   Mental Status: She is oriented to person, place, and time.  ?   Cranial Nerves: No cranial nerve deficit.  ?   Motor: No abnormal muscle tone.  ?   Coordination: Coordination normal.  ?   Deep Tendon Reflexes: Reflexes normal.  ?Psychiatric:     ?   Behavior: Behavior  normal.     ?   Thought Content: Thought content normal.     ?   Judgment: Judgment normal.  ?Thin ? ?Lab Results  ?Component Value Date  ? WBC 8.9 01/07/2022  ? HGB 13.5 01/07/2022  ? HCT 41.0 01/07/2022  ? PLT 328.0 01/07/2022  ? GLUCOSE 81 01/07/2022  ? CHOL 185 01/07/2022  ? TRIG 59.0 01/07/2022  ? HDL 89.60 01/07/2022  ? LDLDIRECT 107.6 08/15/2012  ? [place] 84 01/07/2022  ? ALT 19 01/07/2022  ? AST 47 (H) 01/07/2022  ? NA 139 01/07/2022  ? K 4.2 01/07/2022  ? CL 100 01/07/2022  ? CREATININE 0.61 01/07/2022  ? BUN 10 01/07/2022  ? CO2 29 01/07/2022  ? TSH 2.08 01/07/2022  ? ? ?US BREAST LTD UNI RIGHT INC AXILLA ? ?Result Date: 09/29/2021 ?CLINICAL DATA:  Patient recalled from screening for right breast asymmetry. EXAM: DIGITAL DIAGNOSTIC UNILATERAL RIGHT MAMMOGRAM WITH TOMOSYNTHESIS AND CAD; ULTRASOUND RIGHT BREAST LIMITED TECHNIQUE: Right digital diagnostic mammography and breast tomosynthesis was performed. The images were evaluated with computer-aided detection.; Targeted ultrasound examination of the right breast was performed COMPARISON:  Previous exam(s). ACR Breast Density Category d: The breast tissue is extremely dense, which lowers the sensitivity of mammography. FINDINGS: Questioned asymmetry within the outer right breast partially effaced with additional imaging. Given the marked density, ultrasound will be performed. Targeted ultrasound is performed, showing dense tissue without suspicious mass within the outer right breast. IMPRESSION: No mammographic evidence for malignancy. RECOMMENDATION: Screening mammogram in one year.(Code:SM-B-01Y) I have discussed the findings and recommendations with the patient. If applicable, a reminder letter will be sent to the patient regarding the next appointment. BI-RADS CATEGORY  1: Negative. Electronically Signed   By: Lovey Newcomer M.D.   On: 09/29/2021 13:53 ? ?MM DIAG BREAST TOMO UNI RIGHT ? ?Result Date: 09/29/2021 ?CLINICAL DATA:  Patient recalled from screening  for right breast asymmetry. EXAM: DIGITAL DIAGNOSTIC UNILATERAL RIGHT MAMMOGRAM WITH TOMOSYNTHESIS AND CAD; ULTRASOUND RIGHT BREAST LIMITED TECHNIQUE: Right digital diagnostic mammography and breast tomosynthesis was performed. The images were evaluated with computer-aided detection.; Targeted ultrasound examination of the right breast was performed COMPARISON:  Previous exam(s). ACR Breast Density Category d: The breast tissue is extremely dense, which lowers the sensitivity of mammography. FINDINGS: Questioned asymmetry within the outer right breast partially effaced with additional imaging. Given the marked density, ultrasound will be performed. Targeted ultrasound is performed, showing dense tissue without suspicious mass within the outer right breast. IMPRESSION: No mammographic evidence for malignancy. RECOMMENDATION: Screening mammogram in one year.(Code:SM-B-01Y) I have discussed the findings and recommendations with the patient. If applicable, a reminder letter  will be sent to the patient regarding the next appointment. BI-RADS CATEGORY  1: Negative. Electronically Signed   By: Lovey Newcomer M.D.   On: 09/29/2021 13:53  ? ? ?Assessment & Plan:  ? ?Problem List Items Addressed This Visit   ? ? Well adult exam - Primary  ? Relevant Orders  ? TSH (Completed)  ? Urinalysis (Completed)  ? CBC with Differential/Platelet (Completed)  ? Lipid panel (Completed)  ? Comprehensive metabolic panel (Completed)  ? Cough  ?  Asthmatic cough - coughing at home only - ?allergic to cats vs other.  ?Pt refused Trelegy ?Take Claritin daily ?Advised to see Dr Neldon Mc ? ?  ?  ? Relevant Orders  ? Comprehensive metabolic panel (Completed)  ? Hematuria  ?  Gross x1 new ?UA ?Ref to Dr March Rummage, Urology ?  ?  ? Relevant Orders  ? Urinalysis (Completed)  ? Ambulatory referral to Urology  ? Elevated liver enzymes  ?  Mild.  Liver ultrasound ordered. ? ?  ?  ? Relevant Orders  ? US Abdomen Limited RUQ (LIVER/GB)  ?  ? ? ?No orders of the  defined types were placed in this encounter. ?  ? ? ?Follow-up: Return in about 6 months (around 07/09/2022) for Wellness Exam. ? ?Walker Kehr, MD ?

## 2022-01-07 NOTE — Assessment & Plan Note (Addendum)
Asthmatic cough - coughing at home only - ?allergic to cats vs other.  ?Pt refused Trelegy ?Take Claritin daily ?Advised to see Dr Neldon Mc ?

## 2022-01-08 NOTE — Addendum Note (Signed)
Addended by: Cassandria Anger on: 01/08/2022 12:00 AM ? ? Modules accepted: Orders ? ?

## 2022-01-08 NOTE — Assessment & Plan Note (Signed)
Mild.  Liver ultrasound ordered. ?

## 2022-01-19 ENCOUNTER — Ambulatory Visit
Admission: RE | Admit: 2022-01-19 | Discharge: 2022-01-19 | Disposition: A | Discharge status: Home / Self Care | Payer: PPO | Source: Ambulatory Visit | Attending: Internal Medicine | Admitting: Internal Medicine

## 2022-01-19 DIAGNOSIS — R945 Abnormal results of liver function studies: Secondary | ICD-10-CM | POA: Diagnosis not present

## 2022-01-19 DIAGNOSIS — R748 Abnormal levels of other serum enzymes: Secondary | ICD-10-CM

## 2022-03-20 ENCOUNTER — Telehealth: Payer: Self-pay | Admitting: Pulmonary Disease

## 2022-03-20 NOTE — Telephone Encounter (Signed)
Patient is scheduled to see Dr Melvyn Novas in July. 30 min slot. Pt aware to bring in all medications. Nothing further needed

## 2022-03-20 NOTE — Telephone Encounter (Signed)
Happy to see her once to see if I can help but must bring all active meds with her to ov including otcs and give a 30 min consult slot

## 2022-03-20 NOTE — Telephone Encounter (Signed)
Patient is wanting to switch providers.   She is wanting to switch to Dr Melvyn Novas  Dr Silas Flood are you okay with this switch  Dr Melvyn Novas are you ok with the switch to you.  Please advise

## 2022-03-20 NOTE — Telephone Encounter (Signed)
Fine with me

## 2022-04-02 ENCOUNTER — Encounter: Payer: Self-pay | Admitting: Internal Medicine

## 2022-04-02 ENCOUNTER — Ambulatory Visit (INDEPENDENT_AMBULATORY_CARE_PROVIDER_SITE_OTHER): Payer: PPO

## 2022-04-02 ENCOUNTER — Ambulatory Visit: Payer: PPO | Admitting: Internal Medicine

## 2022-04-02 DIAGNOSIS — R058 Other specified cough: Secondary | ICD-10-CM | POA: Insufficient documentation

## 2022-04-02 DIAGNOSIS — J439 Emphysema, unspecified: Secondary | ICD-10-CM | POA: Diagnosis not present

## 2022-04-02 DIAGNOSIS — R059 Cough, unspecified: Secondary | ICD-10-CM | POA: Diagnosis not present

## 2022-04-02 MED ORDER — PANTOPRAZOLE SODIUM 40 MG PO TBEC: 40.0000 mg | DELAYED_RELEASE_TABLET | Freq: Every day | ORAL | 2 refills | Status: DC

## 2022-04-02 MED ORDER — BENZONATATE 200 MG PO CAPS: 200.0000 mg | ORAL_CAPSULE | Freq: Three times a day (TID) | ORAL | 1 refills | Status: DC | PRN

## 2022-04-02 MED ORDER — METHYLPREDNISOLONE ACETATE 80 MG/ML IJ SUSP
120.0000 mg | Freq: Once | INTRAMUSCULAR | Status: AC
  Administered 2022-04-02: 120 mg via INTRAMUSCULAR

## 2022-04-02 MED ORDER — FAMOTIDINE 20 MG PO TABS: ORAL_TABLET | ORAL | 11 refills | Status: DC

## 2022-04-02 NOTE — Progress Notes (Signed)
[name], female    DOB: [birth year]   MRN: [id number]   Brief patient profile:  11   yowf never smoker/ never allergies  referred to pulmonary clinic 04/02/2022 by Plotnikov  for consult/ second opinion re  cough/hoarseness  .     Dr Hunsucker's last note 05/03/20 Aspiration with vitamin C tablet.  Try to half a tablet yesterday with similar results.  Symptoms of coughing.  Has not happened in the past with medicines.  Does report off and on history of dysphagia.  No concern for pneumonia or aspirated foreign body at this time given normal prior chest x-ray, lack of symptoms, and normal exam today.  Will investigate dysphagia with barium swallow with double contrast material.  If abnormalities present suspect will refer to speech therapy for further evaluation.  If normal, advised to follow aspiration precautions and follow-up as needed.    History of Present Illness  04/02/2022  Pulmonary/ 1st office eval/[name]  Chief Complaint  Patient presents with   Follow-up    Second opinion on cough that she has had for a little over 2 years- started after she aspirated a vitamin c tablet. She states she notices cough more if she bends over slightly. She gets SOB walking up stairs and when has a coughing spell. Her cough is occ prod with thick, clear sputum.   Dyspnea:  44 min to walk  back nine  at [facility] no cough/ sob   Cough: min  clear mucus only daytime, worse if bends over Sleep: flat bed / one pillow no noct resp cc SABA use: none  / no better with trelegy   No obvious day to day or daytime pattern/variability or assoc  purulent sputum or mucus plugs or hemoptysis or cp or chest tightness, subjective wheeze or overt sinus or hb symptoms.   Sleeping  without nocturnal  or early am exacerbation  of respiratory  c/o's or need for noct saba. Also denies any obvious fluctuation of symptoms with weather or environmental changes or other aggravating or alleviating factors except as outlined  above   No unusual exposure hx or h/o childhood pna/ asthma or knowledge of premature birth.  Current Allergies, Complete Past Medical History, Past Surgical History, Family History, and Social History were reviewed in [employer] record.  ROS  The following are not active complaints unless bolded Hoarseness, sore throat, dysphagia, dental problems, itching, sneezing,  nasal congestion or discharge of excess mucus or purulent secretions, ear ache,   fever, chills, sweats, unintended wt loss or wt gain, classically pleuritic or exertional cp,  orthopnea pnd or arm/hand swelling  or leg swelling, presyncope, palpitations, abdominal pain, anorexia, nausea, vomiting, diarrhea  or change in bowel habits or change in bladder habits, change in stools or change in urine, dysuria, hematuria,  rash, arthralgias, visual complaints, headache, numbness, weakness or ataxia or problems with walking or coordination,  change in mood or  memory.           Past Medical History:  Diagnosis Date   Adenomatous colon polyp    Atrophic vaginitis    Benign neoplasm of colon 01/18/2008   DIVERTICULOSIS, MILD 10/26/2007   colonoscopy 10/26/07   H pylori ulcer    HEMORRHOIDS, INTERNAL 01/18/2008   OSTEOPENIA 10/2018   T score 2.1 FRAX 14% / 6%   Skin disorder    NOT PARAPSORIASIS--ETIOLOGY UNCLEAR   Ulcer 01/31/06   STOMACH ULCER    Outpatient Medications Prior to Visit  Medication Sig Dispense Refill   Ascorbic Acid (VITAMIN C) 500 MG CAPS Take 1 capsule by mouth daily     cholecalciferol (VITAMIN D) 1000 UNITS tablet Take 2,000 Units by mouth daily.     Multiple Vitamins-Minerals (MULTIVITAMIN ADULTS 50+) TABS      Omega-3 Fatty Acids (FISH OIL) 1000 MG CAPS Take 1 capsule by mouth 3 (three) times a week.     No facility-administered medications prior to visit.     Objective:     BP 94/68 (BP Location: Left Arm, Cuff Size: Normal)   Pulse 74   Temp 98.2 F (36.8 C) (Oral)   Ht 5'  5" (1.651 m)   Wt 91 lb 6.4 oz (41.5 kg)   SpO2 100% Comment: on RA  BMI 15.21 kg/m   SpO2: 100 % (on RA)  Pleasant amb wf nad classic voice fatigue and pseudowheeze    HEENT : Oropharynx  clear      Nasal turbinates nl    NECK :  without  apparent JVD/ palpable Nodes/TM    LUNGS: no acc muscle use,  Nl contour chest which is clear to A and P bilaterally without cough on insp or exp maneuvers   CV:  RRR  no s3 or murmur or increase in P2, and no edema   ABD:  soft and nontender with nl inspiratory excursion in the supine position. No bruits or organomegaly appreciated   MS:  Nl gait/ ext warm without deformities Or obvious joint restrictions  calf tenderness, cyanosis or clubbing    SKIN: warm and dry without lesions    NEURO:  alert, approp, nl sensorium with  no motor or cerebellar deficits apparent.      CXR PA and Lateral:   04/02/2022 :    I personally reviewed images and impression is as follows:     Hyperinflation only on PA view probably due to pectus deformity/ no acute changes     Assessment   No problem-specific Assessment & Plan notes found for this encounter.   Outpatient Encounter Medications as of 04/02/2022  Medication Sig   Ascorbic Acid (VITAMIN C) 500 MG CAPS Take 1 capsule by mouth daily   benzonatate (TESSALON) 200 MG capsule Take 1 capsule (200 mg total) by mouth 3 (three) times daily as needed for cough.   cholecalciferol (VITAMIN D) 1000 UNITS tablet Take 2,000 Units by mouth daily.   famotidine (PEPCID) 20 MG tablet One after supper   Multiple Vitamins-Minerals (MULTIVITAMIN ADULTS 50+) TABS    pantoprazole (PROTONIX) 40 MG tablet Take 1 tablet (40 mg total) by mouth daily. Take 30-60 min before first meal of the day        methylPREDNISolone acetate (DEPO-MEDROL) injection 120 mg       Christinia Gully, MD 04/02/2022

## 2022-04-02 NOTE — Patient Instructions (Addendum)
Please remember to go to the  x-ray department  for your tests - we will call you with the results when they are available    Pantoprazole (protonix) 40 mg   Take  30-60 min before first meal of the day and Pepcid (famotidine)  20 mg after supper until return to office - this is the best way to tell whether stomach acid is contributing to your problem.    GERD (REFLUX)  is an extremely common cause of respiratory symptoms just like yours , many times with no obvious heartburn at all.    It can be treated with medication, but also with lifestyle changes including elevation of the head of your bed (ideally with 6 -8inch blocks under the headboard of your bed),  Smoking cessation, avoidance of late meals, excessive alcohol, and avoid fatty foods, chocolate, peppermint, colas, red wine, and acidic juices such as orange juice.  NO MINT OR MENTHOL PRODUCTS SO NO COUGH DROPS  USE SUGARLESS CANDY INSTEAD (Jolley ranchers or Stover's or Life Savers) or even ice chips will also do - the key is to swallow to prevent all throat clearing. NO OIL BASED VITAMINS - use powdered substitutes.  Avoid fish oil when coughing.    Depomedrol 120 mg IM   If not   improving in 2 weeks call for referral to Dr Joya Gaskins  at [facility]

## 2022-04-03 ENCOUNTER — Encounter: Payer: Self-pay | Admitting: Internal Medicine

## 2022-04-03 NOTE — Assessment & Plan Note (Signed)
Onset was March 22 2020 immediately p attempted to swallow Vit C - CT chest  06/24/20 nl  - Dx Es  05/09/20  1. Mild barium retention in the vallecula. Barium tablet became transiently lodged in the vallecula, with normal clearance of the tablet into the esophagus and stomach after swallowing thick barium. No laryngeal penetration or tracheobronchial aspiration. Consider speech/swallow pathology consultation. 2. Mild esophageal dysmotility, favor presbyesophagus. 3. No hiatal hernia. No gastroesophageal reflux elicited - 0/26/3785 max cyclical cough rx   Of the three most common causes of  Sub-acute / recurrent or chronic cough, only one (GERD)  can actually contribute to/ trigger  the other two (asthma and post nasal drip syndrome)  and perpetuate the cylce of cough.  While not intuitively obvious, many patients with chronic low grade reflux do not cough until there is a primary insult that disturbs the protective epithelial barrier and exposes sensitive nerve endings.   This is typically viral but can due to PNDS and  either may apply here.   >>>   The point is that once this occurs, it is difficult to eliminate the cycle  using anything but a maximally effective acid suppression regimen at least in the short run, accompanied by an appropriate diet to address non acid GERD and control / eliminate the cough itself to the extent possible with tessalon and hard rock candy but avoid narcotics in favor of low dose gabapentin trial BUT would first have her see Dr Joya Gaskins [facility] voice center)  for look at upper airway given h/o choking on vit C tablet.  Also >>> also added depomedrol 120 mg IM in case of component of Th-2 driven upper or lower airways inflammation (if cough responds short term only to relapse before return while will on full rx for uacs (as above), then  that would point to allergic rhinitis/ asthma or eos bronchitis as alternative dx)           Each maintenance medication was reviewed in  detail including emphasizing most importantly the difference between maintenance and prns and under what circumstances the prns are to be triggered using an action plan format where appropriate.  Total time for H and P, chart review, counseling,   and generating customized AVS unique to this  Office consultation for pt new to me / same day charting = 42 min

## 2022-04-07 ENCOUNTER — Ambulatory Visit: Payer: PPO | Admitting: Allergy and Immunology

## 2022-04-20 ENCOUNTER — Telehealth: Payer: Self-pay | Admitting: Internal Medicine

## 2022-04-20 ENCOUNTER — Encounter: Payer: Self-pay | Admitting: Internal Medicine

## 2022-04-20 NOTE — Telephone Encounter (Signed)
See pt's email from 04/20/22.

## 2022-04-20 NOTE — Telephone Encounter (Signed)
Will forward to Dr. Melvyn Novas as Juluis Rainier.

## 2022-07-09 ENCOUNTER — Ambulatory Visit: Payer: PPO | Admitting: Internal Medicine

## 2022-07-13 ENCOUNTER — Encounter: Payer: Self-pay | Admitting: Internal Medicine

## 2022-07-13 ENCOUNTER — Ambulatory Visit (INDEPENDENT_AMBULATORY_CARE_PROVIDER_SITE_OTHER): Payer: PPO | Admitting: Internal Medicine

## 2022-07-13 DIAGNOSIS — R058 Other specified cough: Secondary | ICD-10-CM | POA: Diagnosis not present

## 2022-07-13 DIAGNOSIS — R319 Hematuria, unspecified: Secondary | ICD-10-CM | POA: Diagnosis not present

## 2022-07-13 DIAGNOSIS — R053 Chronic cough: Secondary | ICD-10-CM | POA: Diagnosis not present

## 2022-07-13 LAB — COMPREHENSIVE METABOLIC PANEL
ALT: 16 U/L (ref 0–35)
AST: 42 U/L — ABNORMAL HIGH (ref 0–37)
Albumin: 4.4 g/dL (ref 3.5–5.2)
Alkaline Phosphatase: 81 U/L (ref 39–117)
BUN: 15 mg/dL (ref 6–23)
CO2: 30 mEq/L (ref 19–32)
Calcium: 9.5 mg/dL (ref 8.4–10.5)
Chloride: 104 mEq/L (ref 96–112)
Creatinine, Ser: 0.73 mg/dL (ref 0.40–1.20)
GFR: 79.69 mL/min (ref >60.00)
Glucose, Bld: 99 mg/dL (ref 70–99)
Potassium: 4.1 mEq/L (ref 3.5–5.1)
Sodium: 141 mEq/L (ref 135–145)
Total Bilirubin: 0.4 mg/dL (ref 0.2–1.2)
Total Protein: 6.8 g/dL (ref 6.0–8.3)

## 2022-07-13 NOTE — Assessment & Plan Note (Signed)
Dr Melvyn Novas Overall better

## 2022-07-13 NOTE — Assessment & Plan Note (Signed)
Pt saw Dr Melvyn Novas Overall better

## 2022-07-13 NOTE — Progress Notes (Signed)
Subjective:  Patient ID: [name], female    DOB: [birth year]  Age: 76 y.o. MRN: [id number]  CC: Follow-up (6 MONTH F/U)   HPI ZENDAYA GROSECLOSE presents for GERD, cough, hematuria  Outpatient Medications Prior to Visit  Medication Sig Dispense Refill   Ascorbic Acid (VITAMIN C) 500 MG CAPS Take 1 capsule by mouth daily     cholecalciferol (VITAMIN D) 1000 UNITS tablet Take 2,000 Units by mouth daily.     Multiple Vitamins-Minerals (MULTIVITAMIN ADULTS 50+) TABS      benzonatate (TESSALON) 200 MG capsule Take 1 capsule (200 mg total) by mouth 3 (three) times daily as needed for cough. (Patient not taking: Reported on 07/13/2022) 30 capsule 1   famotidine (PEPCID) 20 MG tablet One after supper (Patient not taking: Reported on 07/13/2022) 30 tablet 11   pantoprazole (PROTONIX) 40 MG tablet Take 1 tablet (40 mg total) by mouth daily. Take 30-60 min before first meal of the day (Patient not taking: Reported on 07/13/2022) 30 tablet 2   No facility-administered medications prior to visit.    ROS: Review of Systems  Constitutional:  Negative for activity change, appetite change, chills, fatigue and unexpected weight change.  HENT:  Negative for congestion, mouth sores and sinus pressure.   Eyes:  Negative for visual disturbance.  Respiratory:  Positive for cough. Negative for chest tightness.   Gastrointestinal:  Negative for abdominal pain and nausea.  Genitourinary:  Negative for difficulty urinating, frequency and vaginal pain.  Musculoskeletal:  Negative for back pain and gait problem.  Skin:  Negative for pallor and rash.  Neurological:  Negative for dizziness, tremors, weakness, numbness and headaches.  Psychiatric/Behavioral:  Negative for confusion and sleep disturbance.     Objective:  BP 108/60 (BP Location: Left Arm)   Pulse 63   Temp 99.3 F (37.4 C) (Oral)   Ht '5\' 5"'$  (1.651 m)   Wt 92 lb (41.7 kg)   SpO2 97%   BMI 15.31 kg/m   BP Readings from Last 3  Encounters:  07/13/22 108/60  04/02/22 94/68  01/07/22 122/70    Wt Readings from Last 3 Encounters:  07/13/22 92 lb (41.7 kg)  04/02/22 91 lb 6.4 oz (41.5 kg)  01/07/22 93 lb (42.2 kg)    Physical Exam Constitutional:      General: She is not in acute distress.    Appearance: Normal appearance. [name].  HENT:     Head: Normocephalic.     Right Ear: External ear normal.     Left Ear: External ear normal.     Nose: Nose normal.  Eyes:     General:        Right eye: No discharge.        Left eye: No discharge.     Conjunctiva/sclera: Conjunctivae normal.     Pupils: Pupils are equal, round, and reactive to light.  Neck:     Thyroid: No thyromegaly.     Vascular: No JVD.     Trachea: No tracheal deviation.  Cardiovascular:     Rate and Rhythm: Normal rate and regular rhythm.     Heart sounds: Normal heart sounds.  Pulmonary:     Effort: No respiratory distress.     Breath sounds: No stridor. No wheezing.  Abdominal:     General: Bowel sounds are normal. There is no distension.     Palpations: Abdomen is soft. There is no mass.     Tenderness: There is no  abdominal tenderness. There is no guarding or rebound.  Musculoskeletal:        General: No tenderness.     Cervical back: Normal range of motion and neck supple. No rigidity.  Lymphadenopathy:     Cervical: No cervical adenopathy.  Skin:    Findings: No erythema or rash.  Neurological:     Cranial Nerves: No cranial nerve deficit.     Motor: No abnormal muscle tone.     Coordination: Coordination normal.     Deep Tendon Reflexes: Reflexes normal.  Psychiatric:        Behavior: Behavior normal.        Thought Content: Thought content normal.        Judgment: Judgment normal.     Lab Results  Component Value Date   WBC 8.9 01/07/2022   HGB 13.5 01/07/2022   HCT 41.0 01/07/2022   PLT 328.0 01/07/2022   GLUCOSE 81 01/07/2022   CHOL 185 01/07/2022   TRIG 59.0 01/07/2022   HDL 89.60  01/07/2022   LDLDIRECT 107.6 08/15/2012   LDLCALC 84 01/07/2022   ALT 19 01/07/2022   AST 47 (H) 01/07/2022   NA 139 01/07/2022   K 4.2 01/07/2022   CL 100 01/07/2022   CREATININE 0.61 01/07/2022   BUN 10 01/07/2022   CO2 29 01/07/2022   TSH 2.08 01/07/2022    US Abdomen Limited RUQ (LIVER/GB)  Result Date: 01/19/2022 CLINICAL DATA:  Elevated liver enzymes EXAM: ULTRASOUND ABDOMEN LIMITED RIGHT UPPER QUADRANT COMPARISON:  Abdomen ultrasound July 19, 2008 FINDINGS: Gallbladder: No gallstones or wall thickening visualized. No sonographic Murphy sign noted by sonographer. Common bile duct: Diameter: 1.9 mm Liver: No focal lesion identified. Mild diffuse increased echotexture is noted. Portal vein is patent on color Doppler imaging with normal direction of blood flow towards the liver. Other: None. IMPRESSION: Mild diffuse increased echotexture of the liver, nonspecific but can be seen in fatty infiltration of liver. Electronically Signed   By: Abelardo Diesel M.D.   On: 01/19/2022 11:52    Assessment & Plan:   Problem List Items Addressed This Visit     Cough    Pt saw Dr Melvyn Novas Overall better       Relevant Orders   Comprehensive metabolic panel   Hematuria    One time gross hematuria in 12/2021 Will resch w/Urology - Dr Claudia Desanctis      Relevant Orders   Ambulatory referral to Urology   Comprehensive metabolic panel   Urinalysis   Upper airway cough syndrome    Dr Melvyn Novas Overall better          No orders of the defined types were placed in this encounter.     Follow-up: Return in about 6 months (around 01/12/2023) for a follow-up visit.  Walker Kehr, MD

## 2022-07-13 NOTE — Assessment & Plan Note (Addendum)
One time gross hematuria in 12/2021 Will resch w/Urology - Dr Claudia Desanctis

## 2022-07-14 LAB — URINALYSIS
Bilirubin Urine: NEGATIVE
Hgb urine dipstick: NEGATIVE
Ketones, ur: NEGATIVE
Leukocytes,Ua: NEGATIVE
Nitrite: NEGATIVE
Specific Gravity, Urine: 1.02 (ref 1.000–1.030)
Total Protein, Urine: NEGATIVE
Urine Glucose: NEGATIVE
Urobilinogen, UA: 0.2 (ref 0.0–1.0)
pH: 6 (ref 5.0–8.0)

## 2022-08-25 NOTE — Progress Notes (Unsigned)
NEW PATIENT Date of Service/Encounter:  08/27/22 Referring provider: Cassandria Anger, MD Primary care provider: Cassandria Anger, MD  Subjective:  [name] is a 76 y.o. female with a PMHx of atherosclerosis, osteopenia, chronic fatigue, GERD presenting today for evaluation of chronic cough. History obtained from: chart review and patient.   Chronic cough: March 22, 2020-swallowed a pill the wrong way and has been coughing since. She has had multiple evaluations for cough including imaging (see below) She has cough with shortness of breath. Cough feels wet in her chest, but seldom has productive sputum. When she does produce mucus it is usually white and foamy. Cough occurs intermittently every day, and same intensity during day and night.  Worse when she is active. Wakes her up from sleep when she is wheezing which is a newer symptoms and started last week.  This has occurred 3 or 4 times since last week. Feels mucus in chest. Cough worsens when bends over., also causes SOB. Walking up stairs makes her feel SOB. Denies PND, nasal congestion or runny nose.  All symptoms are in her chest. Not currently taking any medications.  She was taking pantoprazole and famotidine which helped cough but made her wheeze more so she stopped it.  Her husband has been hospitalized recently so she has not taken her own health as seriously.  She has been evaluated by 2 pulmonologist. She has not seen GI.  Never seen a speech therapist. Never diagnosed with asthma or used inhalers/nebulizers. Does not consider herself to be someone with allergy. She is not a smoker, and has no second hand smoker. 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     Chart Review: Patient is followed by Dr. Melvyn Novas for upper airway cough syndrome.  Last visit 04/02/2022. Chest x-ray 04/04/2022-Emphysema and chronic interstitial coarsening. No focal consolidation,  pleural effusion, pneumothorax. The cardiac silhouette is within normal limits. No acute osseous pathology. Osteopenia. CT chest 06/28/2020: Normal Instructed to start pantoprazole 40 mg daily and Pepcid 20 mg nightly. Barium swallow 05/09/20: "IMPRESSION: 1. Mild barium retention in the vallecula. Barium tablet became transiently lodged in the vallecula, with normal clearance of the tablet into the esophagus and stomach after swallowing thick barium. No laryngeal penetration or tracheobronchial aspiration. Consider speech/swallow pathology consultation. 2. Mild esophageal dysmotility, favor presbyesophagus. 3. No hiatal hernia. No gastroesophageal reflux elicited. No evidence of esophageal mass or stricture."  Other allergy screening: Asthma: no Rhino conjunctivitis: no Food allergy: no Medication allergy: no Hymenoptera allergy: no Urticaria: no Eczema:no History of recurrent infections suggestive of immunodeficency: no   Past Medical History: Past Medical History:  Diagnosis Date   Adenomatous colon polyp    Atrophic vaginitis    Benign neoplasm of colon 01/18/2008   DIVERTICULOSIS, MILD 10/26/2007   colonoscopy 10/26/07   H pylori ulcer    HEMORRHOIDS, INTERNAL 01/18/2008   OSTEOPENIA 10/2018   T score 2.1 FRAX 14% / 6%   Skin disorder    NOT PARAPSORIASIS--ETIOLOGY UNCLEAR   Ulcer 01/31/06   STOMACH ULCER   Medication List:  Current Outpatient Medications  Medication Sig Dispense Refill   Ascorbic Acid (VITAMIN C) 500 MG CAPS Take 1 capsule by mouth daily     cholecalciferol (VITAMIN D) 1000 UNITS tablet Take 2,000 Units by mouth daily.     Multiple Vitamins-Minerals (MULTIVITAMIN ADULTS 50+) TABS      albuterol (VENTOLIN HFA) 108 (90 Base) MCG/ACT inhaler Inhale  2 puffs into the lungs every 6 (six) hours as needed for wheezing or shortness of breath. 8 g 2   budesonide-formoterol (SYMBICORT) 80-4.5 MCG/ACT inhaler Inhale 2 puffs into the lungs in the morning and at  bedtime. 1 each 3   famotidine (PEPCID) 20 MG tablet SMARTSIG:1 Tablet(s) By Mouth Every Evening (Patient not taking: Reported on 08/27/2022)     No current facility-administered medications for this visit.   Known Allergies:  Allergies  Allergen Reactions   Fosamax [Alendronate Sodium] Other (See Comments)    Abdominal pain   Zanaflex [Tizanidine]     Did not feel good   Past Surgical History: Past Surgical History:  Procedure Laterality Date   APPENDECTOMY  1963   COLONOSCOPY W/ BIOPSIES     multiple   ESOPHAGOGASTRODUODENOSCOPY  03/30/2006   GANGLION CYST EXCISION  1972   Right   Gastric Ulcer and GI Bleed (hospitalization)  5/07   LYMPH NODE BIOPSY  2001   Benign from the groin-Inflammatory changes due to her skin condition.   s/p eradication of H. Pylori     confirmed by urea breath teast   SKIN CANCER EXCISION     removed from the forehead   [place]     Family History: Family History  Problem Relation Age of Onset   Breast cancer Mother        Age 83's   Heart disease Mother    Heart disease Father    Breast cancer Maternal Aunt        Age 6's   Heart disease Maternal Grandmother    Colon cancer Maternal Grandfather    Breast cancer Cousin        maternal   Colon cancer Cousin    Colon cancer Maternal Uncle    Breast cancer Cousin    Breast cancer Cousin    Social History: Tommy lives in a house built 25 years ago, carpet in the bedroom, gas eating, central AC, 2 indoor cats, no cockroaches, using dust mite protection on the bedding and pillows, no smoke exposure in the car.  She is retired Optometrist.  + HEPA filter in the home.  [place]   ROS:  All other systems negative except as noted per HPI.  Objective:  Blood pressure 132/62, pulse (!) 102, temperature (!) 97 F (36.1 C), temperature source Temporal, resp. rate 17, height '5\' 5"'$  (1.651 m), weight 89 lb (40.4 kg), SpO2 98 %. Body mass  index is 14.81 kg/m. Physical Exam:  General Appearance:  Alert, cooperative, no distress, appears stated age  Head:  Normocephalic, without obvious abnormality, atraumatic  Eyes:  Conjunctiva clear, EOM's intact  Nose: Nares normal, normal mucosa and no visible anterior polyps  Throat: Lips, tongue normal; teeth and gums normal, normal posterior oropharynx  Neck: Supple, symmetrical  Lungs:   clear to auscultation bilaterally, Respirations unlabored, no coughing  Heart:  regular rate and rhythm and no murmur, Appears well perfused  Extremities: No edema  Skin: Skin color, texture, turgor normal, no rashes or lesions on visualized portions of skin  Neurologic: No gross deficits     Diagnostics: Spirometry:  Tracings reviewed. Her effort: It was hard to get consistent efforts and there is a question as to whether this reflects a maximal maneuver. FVC: 1.12L (pre), 1.19L  (post) FEV1: 0.89L, 43% predicted (pre), 0.90L, 43% predicted (post) FEV1/FVC ratio: 0.62 (pre), 0.76 (post) Interpretation:  possible obstruction and restriction  with out significant bronchodilator response-severe restriction, clinically unsure if feels improvement  Skin Testing:  Deferred - patient does not feel necessary. She has no history of allergies and denies rhinitis/conjunctivitis symptoms .   Assessment and Plan  Chronic cough:  Etiology of chronic cough is broad. Common considerations include asthma, COPD, allergic rhinitis, nonallergic rhinitis, infections, reflux (GERD/LPR), neurogenic and/or habitual cough.    The history and physical examination suggest this cough is multifactorial and potentially attributed to GERD and oral dysphagia as well as other lung etiologies-- imaging showing interstitial thickening and emphysema and spirometry today showing restriction. We will try inhaled steroids in the meantime, but have recommended follow-up with pulmonary. Have also recommended speech consultation, but  she does not want to pursue this.  - your lung testing today showed restriction and did not show improvement after bronchodilator - you have an abnormal swallow study from 2021 with no follow-up from speech therapy - you have a cxr from 2023 showing concern for emphysema and chronic interstitial coarsening  We will trial the therapy as below:   - Controller Inhaler: Start trial of Symbicort 80 mcg 2 puffs twice a day; This Should Be Used Everyday - Rinse mouth out after use - Rescue Inhaler:trial of  Albuterol (Proair/Ventolin) 2 puffs . Use  every 4-6 hours as needed for chest tightness, wheezing, or coughing.  Can also use 15 minutes prior to exercise if you have symptoms with activity. - Asthma is not controlled if:  - Symptoms are occurring >2 times a week OR  - >2 times a month nighttime awakenings  - You are requiring systemic steroids (prednisone/steroid injections) more than once per year  - Your require hospitalization for your asthma.  - Please call the clinic to schedule a follow up if these symptoms arise  - would recommend speech therapy consultation - would recommend follow-up with pulmonary based on today's findings and your previous imaging  Follow-up in 4 to 6 weeks, sooner if needed.   This note in its entirety was forwarded to the Provider who requested this consultation.  Thank you for your kind referral. I appreciate the opportunity to take part in Keeya's care. Please do not hesitate to contact me with questions.  Sincerely,  Sigurd Sos, MD [facility]

## 2022-08-27 ENCOUNTER — Encounter: Payer: Self-pay | Admitting: Internal Medicine

## 2022-08-27 ENCOUNTER — Ambulatory Visit (INDEPENDENT_AMBULATORY_CARE_PROVIDER_SITE_OTHER): Payer: PPO | Admitting: Internal Medicine

## 2022-08-27 VITALS — BP 132/62 | HR 102 | Temp 97.0°F | Resp 17 | Ht 65.0 in | Wt 89.0 lb

## 2022-08-27 DIAGNOSIS — R053 Chronic cough: Secondary | ICD-10-CM | POA: Diagnosis not present

## 2022-08-27 DIAGNOSIS — J984 Other disorders of lung: Secondary | ICD-10-CM | POA: Diagnosis not present

## 2022-08-27 MED ORDER — ALBUTEROL SULFATE HFA 108 (90 BASE) MCG/ACT IN AERS: 2.0000 | INHALATION_SPRAY | Freq: Four times a day (QID) | RESPIRATORY_TRACT | 2 refills | Status: DC | PRN

## 2022-08-27 MED ORDER — BUDESONIDE-FORMOTEROL FUMARATE 80-4.5 MCG/ACT IN AERO: 2.0000 | INHALATION_SPRAY | Freq: Two times a day (BID) | RESPIRATORY_TRACT | 3 refills | Status: DC

## 2022-08-27 NOTE — Patient Instructions (Addendum)
Chronic cough: - your lung testing today showed restriction and did not show improvement after bronchodilator - you have an abnormal swallow study from 2021 with no follow-up from speech therapy - you have a cxr from 2023 showing concern for emphysema and chronic interstitial coarsening  We will trial the therapy as below:   - Controller Inhaler: Start trial of Symbicort 80 mcg 2 puffs twice a day; This Should Be Used Everyday - Rinse mouth out after use - Rescue Inhaler:trial of  Albuterol (Proair/Ventolin) 2 puffs . Use  every 4-6 hours as needed for chest tightness, wheezing, or coughing.  Can also use 15 minutes prior to exercise if you have symptoms with activity. - Asthma is not controlled if:  - Symptoms are occurring >2 times a week OR  - >2 times a month nighttime awakenings  - You are requiring systemic steroids (prednisone/steroid injections) more than once per year  - Your require hospitalization for your asthma.  - Please call the clinic to schedule a follow up if these symptoms arise  - would recommend speech therapy consultation - would recommend follow-up with pulmonary based on today's findings and your previous imaging  Follow-up in 4 to 6 weeks, sooner if needed.

## 2022-08-28 DIAGNOSIS — H2513 Age-related nuclear cataract, bilateral: Secondary | ICD-10-CM | POA: Diagnosis not present

## 2022-08-28 DIAGNOSIS — H5203 Hypermetropia, bilateral: Secondary | ICD-10-CM | POA: Diagnosis not present

## 2022-09-02 DIAGNOSIS — R519 Headache, unspecified: Secondary | ICD-10-CM | POA: Diagnosis not present

## 2022-09-02 DIAGNOSIS — J069 Acute upper respiratory infection, unspecified: Secondary | ICD-10-CM | POA: Diagnosis not present

## 2022-09-03 NOTE — Progress Notes (Signed)
Hey Dr Simona Huh,   I receive the CC'd Chart. Did you want to do a referral for this patient?  I see at the bottom of your note you mentioned the following: (would recommend speech therapy consultation)   Thanks

## 2022-09-18 ENCOUNTER — Encounter: Payer: Self-pay | Admitting: Internal Medicine

## 2022-09-18 ENCOUNTER — Ambulatory Visit (INDEPENDENT_AMBULATORY_CARE_PROVIDER_SITE_OTHER): Payer: PPO | Admitting: Internal Medicine

## 2022-09-18 VITALS — BP 124/68 | HR 93 | Temp 97.9°F | Ht 65.0 in | Wt 88.6 lb

## 2022-09-18 DIAGNOSIS — F439 Reaction to severe stress, unspecified: Secondary | ICD-10-CM | POA: Insufficient documentation

## 2022-09-18 DIAGNOSIS — R053 Chronic cough: Secondary | ICD-10-CM

## 2022-09-18 DIAGNOSIS — R634 Abnormal weight loss: Secondary | ICD-10-CM | POA: Diagnosis not present

## 2022-09-18 MED ORDER — AZITHROMYCIN 250 MG PO TABS: ORAL_TABLET | ORAL | 0 refills | Status: DC

## 2022-09-18 MED ORDER — CLARITIN-D 12 HOUR 5-120 MG PO TB12: 1.0000 | ORAL_TABLET | Freq: Two times a day (BID) | ORAL | 3 refills | Status: DC | PRN

## 2022-09-18 MED ORDER — PROMETHAZINE-DM 6.25-15 MG/5ML PO SYRP: 5.0000 mL | ORAL_SOLUTION | Freq: Four times a day (QID) | ORAL | 0 refills | Status: DC | PRN

## 2022-09-18 MED ORDER — METHYLPREDNISOLONE 4 MG PO TBPK: ORAL_TABLET | ORAL | 0 refills | Status: DC

## 2022-09-18 NOTE — Assessment & Plan Note (Signed)
F/u w/Dr Simona Huh, Dr Neldon Mc, Dr Melvyn Novas Re-start Pepcid Sudafed prn Medrol pack prn Prometh cough syr prn

## 2022-09-18 NOTE — Assessment & Plan Note (Signed)
Husband is sick 76 yo at the [facility] now Discussed

## 2022-09-18 NOTE — Assessment & Plan Note (Signed)
Increase calorie intake

## 2022-09-18 NOTE — Progress Notes (Signed)
Subjective:  Patient ID: [name], female    DOB: [birth year]  Age: 76 y.o. MRN: [id number]  CC: Cough   HPI [name] presents for cough and SOB since Nov 2023, better now. C/o mucus a lot of it, yellow at times.  The pt saw Dr Simona Huh (Allergy) - given Symbicort  Outpatient Medications Prior to Visit  Medication Sig Dispense Refill   Ascorbic Acid (VITAMIN C) 500 MG CAPS Take 1 capsule by mouth daily     cholecalciferol (VITAMIN D) 1000 UNITS tablet Take 2,000 Units by mouth daily.     famotidine (PEPCID) 20 MG tablet      Multiple Vitamins-Minerals (MULTIVITAMIN ADULTS 50+) TABS      albuterol (VENTOLIN HFA) 108 (90 Base) MCG/ACT inhaler Inhale 2 puffs into the lungs every 6 (six) hours as needed for wheezing or shortness of breath. (Patient not taking: Reported on 09/18/2022) 8 g 2   budesonide-formoterol (SYMBICORT) 80-4.5 MCG/ACT inhaler Inhale 2 puffs into the lungs in the morning and at bedtime. (Patient not taking: Reported on 09/18/2022) 1 each 3   No facility-administered medications prior to visit.    ROS: Review of Systems  Constitutional:  Negative for activity change, appetite change, chills, fatigue and unexpected weight change.  HENT:  Negative for congestion, mouth sores and sinus pressure.   Eyes:  Negative for visual disturbance.  Respiratory:  Positive for cough, shortness of breath and wheezing. Negative for chest tightness.   Gastrointestinal:  Negative for abdominal pain and nausea.  Genitourinary:  Negative for difficulty urinating, frequency and vaginal pain.  Musculoskeletal:  Negative for back pain and gait problem.  Skin:  Negative for pallor and rash.  Neurological:  Negative for dizziness, tremors, weakness, numbness and headaches.  Psychiatric/Behavioral:  Negative for confusion, sleep disturbance and suicidal ideas.     Objective:  BP 124/68 (BP Location: Left Arm, Patient Position: Sitting, Cuff Size: Normal)   Pulse 93   Temp  97.9 F (36.6 C) (Oral)   Ht '5\' 5"'$  (1.651 m)   Wt 88 lb 9.6 oz (40.2 kg)   SpO2 99%   BMI 14.74 kg/m   BP Readings from Last 3 Encounters:  09/18/22 124/68  08/27/22 132/62  07/13/22 108/60    Wt Readings from Last 3 Encounters:  09/18/22 88 lb 9.6 oz (40.2 kg)  08/27/22 89 lb (40.4 kg)  07/13/22 92 lb (41.7 kg)    Physical Exam Constitutional:      General: She is not in acute distress.    Appearance: Normal appearance. [name].  HENT:     Head: Normocephalic.     Right Ear: External ear normal.     Left Ear: External ear normal.     Nose: Nose normal.  Eyes:     General:        Right eye: No discharge.        Left eye: No discharge.     Conjunctiva/sclera: Conjunctivae normal.     Pupils: Pupils are equal, round, and reactive to light.  Neck:     Thyroid: No thyromegaly.     Vascular: No JVD.     Trachea: No tracheal deviation.  Cardiovascular:     Rate and Rhythm: Normal rate and regular rhythm.     Heart sounds: Normal heart sounds.  Pulmonary:     Effort: No respiratory distress.     Breath sounds: No stridor. No wheezing.  Abdominal:     General: Bowel sounds  are normal. There is no distension.     Palpations: Abdomen is soft. There is no mass.     Tenderness: There is no abdominal tenderness. There is no guarding or rebound.  Musculoskeletal:        General: No tenderness.     Cervical back: Normal range of motion and neck supple. No rigidity.  Lymphadenopathy:     Cervical: No cervical adenopathy.  Skin:    Findings: No erythema or rash.  Neurological:     Cranial Nerves: No cranial nerve deficit.     Motor: No abnormal muscle tone.     Coordination: Coordination normal.     Deep Tendon Reflexes: Reflexes normal.  Psychiatric:        Behavior: Behavior normal.        Thought Content: Thought content normal.        Judgment: Judgment normal.   thin  Lab Results  Component Value Date   WBC 8.9 01/07/2022   HGB 13.5 01/07/2022    HCT 41.0 01/07/2022   PLT 328.0 01/07/2022   GLUCOSE 99 07/13/2022   CHOL 185 01/07/2022   TRIG 59.0 01/07/2022   HDL 89.60 01/07/2022   LDLDIRECT 107.6 08/15/2012   LDLCALC 84 01/07/2022   ALT 16 07/13/2022   AST 42 (H) 07/13/2022   NA 141 07/13/2022   K 4.1 07/13/2022   CL 104 07/13/2022   CREATININE 0.73 07/13/2022   BUN 15 07/13/2022   CO2 30 07/13/2022   TSH 2.08 01/07/2022    US Abdomen Limited RUQ (LIVER/GB)  Result Date: 01/19/2022 CLINICAL DATA:  Elevated liver enzymes EXAM: ULTRASOUND ABDOMEN LIMITED RIGHT UPPER QUADRANT COMPARISON:  Abdomen ultrasound July 19, 2008 FINDINGS: Gallbladder: No gallstones or wall thickening visualized. No sonographic Murphy sign noted by sonographer. Common bile duct: Diameter: 1.9 mm Liver: No focal lesion identified. Mild diffuse increased echotexture is noted. Portal vein is patent on color Doppler imaging with normal direction of blood flow towards the liver. Other: None. IMPRESSION: Mild diffuse increased echotexture of the liver, nonspecific but can be seen in fatty infiltration of liver. Electronically Signed   By: Abelardo Diesel M.D.   On: 01/19/2022 11:52    Assessment & Plan:   Problem List Items Addressed This Visit     Weight loss    Increase calorie intake      Stress at home    Husband is sick 25 yo at the [facility] now Discussed      Chronic cough - Primary    F/u w/Dr Simona Huh, Dr Neldon Mc, Dr Melvyn Novas Re-start Pepcid Sudafed prn Medrol pack prn Prometh cough syr prn         Meds ordered this encounter  Medications   loratadine-pseudoephedrine (CLARITIN-D 12 HOUR) 5-120 MG tablet    Sig: Take 1 tablet by mouth 2 (two) times daily as needed for allergies.    Dispense:  60 tablet    Refill:  3   methylPREDNISolone (MEDROL DOSEPAK) 4 MG TBPK tablet    Sig: As directed    Dispense:  21 tablet    Refill:  0   promethazine-dextromethorphan (PROMETHAZINE-DM) 6.25-15 MG/5ML syrup    Sig: Take 5 mLs by mouth 4  (four) times daily as needed for cough.    Dispense:  240 mL    Refill:  0   azithromycin (ZITHROMAX Z-PAK) 250 MG tablet    Sig: As directed    Dispense:  6 tablet    Refill:  0  Follow-up: Return in about 3 months (around 12/18/2022) for a follow-up visit.  Walker Kehr, MD

## 2022-10-08 ENCOUNTER — Ambulatory Visit: Payer: PPO | Admitting: Internal Medicine

## 2022-10-30 ENCOUNTER — Other Ambulatory Visit: Payer: Self-pay | Admitting: Internal Medicine

## 2022-10-30 DIAGNOSIS — Z1231 Encounter for screening mammogram for malignant neoplasm of breast: Secondary | ICD-10-CM

## 2022-11-10 ENCOUNTER — Ambulatory Visit
Admission: RE | Admit: 2022-11-10 | Discharge: 2022-11-10 | Disposition: A | Discharge status: Home / Self Care | Payer: PPO | Source: Ambulatory Visit | Attending: Internal Medicine | Admitting: Internal Medicine

## 2022-11-10 DIAGNOSIS — Z1231 Encounter for screening mammogram for malignant neoplasm of breast: Secondary | ICD-10-CM

## 2022-11-12 ENCOUNTER — Encounter: Payer: Self-pay | Admitting: Internal Medicine

## 2022-11-13 ENCOUNTER — Other Ambulatory Visit: Payer: Self-pay | Admitting: Internal Medicine

## 2022-11-13 MED ORDER — PROMETHAZINE-DM 6.25-15 MG/5ML PO SYRP: 5.0000 mL | ORAL_SOLUTION | Freq: Four times a day (QID) | ORAL | 0 refills | Status: DC | PRN

## 2022-12-02 ENCOUNTER — Ambulatory Visit: Payer: PPO | Admitting: Dermatology

## 2022-12-25 ENCOUNTER — Encounter: Payer: Self-pay | Admitting: Internal Medicine

## 2022-12-25 ENCOUNTER — Other Ambulatory Visit: Payer: Self-pay | Admitting: Internal Medicine

## 2022-12-25 MED ORDER — CLARITIN-D 12 HOUR 5-120 MG PO TB12: 1.0000 | ORAL_TABLET | Freq: Two times a day (BID) | ORAL | 3 refills | Status: DC | PRN

## 2022-12-25 MED ORDER — FAMOTIDINE 20 MG PO TABS: ORAL_TABLET | ORAL | 5 refills | Status: DC

## 2022-12-25 MED ORDER — AZITHROMYCIN 250 MG PO TABS: ORAL_TABLET | ORAL | 0 refills | Status: DC

## 2022-12-25 MED ORDER — PROMETHAZINE-DM 6.25-15 MG/5ML PO SYRP: 5.0000 mL | ORAL_SOLUTION | Freq: Four times a day (QID) | ORAL | 0 refills | Status: DC | PRN

## 2022-12-25 MED ORDER — ALBUTEROL SULFATE HFA 108 (90 BASE) MCG/ACT IN AERS: 2.0000 | INHALATION_SPRAY | Freq: Four times a day (QID) | RESPIRATORY_TRACT | 2 refills | Status: DC | PRN

## 2022-12-25 MED ORDER — METHYLPREDNISOLONE 4 MG PO TBPK: ORAL_TABLET | ORAL | 0 refills | Status: DC

## 2022-12-29 ENCOUNTER — Encounter: Payer: Self-pay | Admitting: Internal Medicine

## 2022-12-31 DIAGNOSIS — J209 Acute bronchitis, unspecified: Secondary | ICD-10-CM | POA: Diagnosis not present

## 2022-12-31 DIAGNOSIS — R5383 Other fatigue: Secondary | ICD-10-CM | POA: Diagnosis not present

## 2022-12-31 DIAGNOSIS — J189 Pneumonia, unspecified organism: Secondary | ICD-10-CM | POA: Diagnosis not present

## 2022-12-31 DIAGNOSIS — R519 Headache, unspecified: Secondary | ICD-10-CM | POA: Diagnosis not present

## 2022-12-31 DIAGNOSIS — R0600 Dyspnea, unspecified: Secondary | ICD-10-CM | POA: Diagnosis not present

## 2022-12-31 DIAGNOSIS — R051 Acute cough: Secondary | ICD-10-CM | POA: Diagnosis not present

## 2023-01-12 ENCOUNTER — Encounter: Payer: Self-pay | Admitting: Internal Medicine

## 2023-01-12 ENCOUNTER — Ambulatory Visit (INDEPENDENT_AMBULATORY_CARE_PROVIDER_SITE_OTHER): Payer: PPO | Admitting: Internal Medicine

## 2023-01-12 VITALS — BP 90/60 | HR 96 | Temp 97.7°F | Ht 65.0 in | Wt 88.6 lb

## 2023-01-12 DIAGNOSIS — F439 Reaction to severe stress, unspecified: Secondary | ICD-10-CM | POA: Diagnosis not present

## 2023-01-12 DIAGNOSIS — M8580 Other specified disorders of bone density and structure, unspecified site: Secondary | ICD-10-CM

## 2023-01-12 DIAGNOSIS — J189 Pneumonia, unspecified organism: Secondary | ICD-10-CM | POA: Diagnosis not present

## 2023-01-12 DIAGNOSIS — I2510 Atherosclerotic heart disease of native coronary artery without angina pectoris: Secondary | ICD-10-CM | POA: Diagnosis not present

## 2023-01-12 DIAGNOSIS — I2583 Coronary atherosclerosis due to lipid rich plaque: Secondary | ICD-10-CM | POA: Diagnosis not present

## 2023-01-12 NOTE — Assessment & Plan Note (Signed)
a recent CAP per UC 2 wks ago Pt took Augmentin and Doxy, Albuterol

## 2023-01-12 NOTE — Progress Notes (Signed)
Subjective:  Patient ID: [name], female    DOB: 02/02/1946  Age: 77 y.o. MRN: [id number]  CC: Follow-up (Pt states that she is still tired from getting over pneumonia )   HPI [name] presents for a recent CAP per UC 2 wks ago Pt took Augmentin and Doxy, Albuterol F/u on cough, stress  Outpatient Medications Prior to Visit  Medication Sig Dispense Refill   albuterol (VENTOLIN HFA) 108 (90 Base) MCG/ACT inhaler Inhale 2 puffs into the lungs every 6 (six) hours as needed for wheezing or shortness of breath. 8 g 2   Ascorbic Acid (VITAMIN C) 500 MG CAPS Take 1 capsule by mouth daily     azithromycin (ZITHROMAX Z-PAK) 250 MG tablet As directed 6 tablet 0   cholecalciferol (VITAMIN D) 1000 UNITS tablet Take 2,000 Units by mouth daily.     famotidine (PEPCID) 20 MG tablet 1 daily 30 tablet 5   loratadine-pseudoephedrine (CLARITIN-D 12 HOUR) 5-120 MG tablet Take 1 tablet by mouth 2 (two) times daily as needed for allergies. 60 tablet 3   methylPREDNISolone (MEDROL DOSEPAK) 4 MG TBPK tablet As directed 21 tablet 0   Multiple Vitamins-Minerals (MULTIVITAMIN ADULTS 50+) TABS      promethazine-dextromethorphan (PROMETHAZINE-DM) 6.25-15 MG/5ML syrup Take 5 mLs by mouth 4 (four) times daily as needed for cough. 240 mL 0   No facility-administered medications prior to visit.    ROS: Review of Systems  Constitutional:  Negative for activity change, appetite change, chills, fatigue and unexpected weight change.  HENT:  Negative for congestion, mouth sores and sinus pressure.   Eyes:  Negative for visual disturbance.  Respiratory:  Negative for cough and chest tightness.   Gastrointestinal:  Negative for abdominal pain and nausea.  Genitourinary:  Negative for difficulty urinating, frequency and vaginal pain.  Musculoskeletal:  Negative for back pain and gait problem.  Skin:  Negative for pallor and rash.  Neurological:  Negative for dizziness, tremors, weakness, numbness and  headaches.  Psychiatric/Behavioral:  Negative for confusion and sleep disturbance.     Objective:  BP 90/60   Pulse 96   Temp 97.7 F (36.5 C) (Oral)   Ht  (1.651 m)   Wt 88 lb 9.6 oz (40.2 kg)   SpO2 99%   BMI 14.74 kg/m   BP Readings from Last 3 Encounters:  01/12/23 90/60  09/18/22 124/68  08/27/22 132/62    Wt Readings from Last 3 Encounters:  01/12/23 88 lb 9.6 oz (40.2 kg)  09/18/22 88 lb 9.6 oz (40.2 kg)  08/27/22 89 lb (40.4 kg)    Physical Exam Constitutional:      General: She is not in acute distress.    Appearance: Normal appearance. [name].  HENT:     Head: Normocephalic.     Right Ear: External ear normal.     Left Ear: External ear normal.     Nose: Nose normal.  Eyes:     General:        Right eye: No discharge.        Left eye: No discharge.     Conjunctiva/sclera: Conjunctivae normal.     Pupils: Pupils are equal, round, and reactive to light.  Neck:     Thyroid: No thyromegaly.     Vascular: No JVD.     Trachea: No tracheal deviation.  Cardiovascular:     Rate and Rhythm: Normal rate and regular rhythm.     Heart sounds: Normal heart  sounds.  Pulmonary:     Effort: No respiratory distress.     Breath sounds: No stridor. No wheezing.  Abdominal:     General: Bowel sounds are normal. There is no distension.     Palpations: Abdomen is soft. There is no mass.     Tenderness: There is no abdominal tenderness. There is no guarding or rebound.  Musculoskeletal:        General: No tenderness.     Cervical back: Normal range of motion and neck supple. No rigidity.  Lymphadenopathy:     Cervical: No cervical adenopathy.  Skin:    Findings: No erythema or rash.  Neurological:     Cranial Nerves: No cranial nerve deficit.     Motor: No abnormal muscle tone.     Coordination: Coordination normal.     Deep Tendon Reflexes: Reflexes normal.  Psychiatric:        Behavior: Behavior normal.        Thought Content: Thought  content normal.        Judgment: Judgment normal.   thin  Lab Results  Component Value Date   WBC 8.9 01/07/2022   HGB 13.5 01/07/2022   HCT 41.0 01/07/2022   PLT 328.0 01/07/2022   GLUCOSE 99 07/13/2022   CHOL 185 01/07/2022   TRIG 59.0 01/07/2022   HDL 89.60 01/07/2022   LDLDIRECT 107.6 08/15/2012   LDLCALC 84 01/07/2022   ALT 16 07/13/2022   AST 42 (H) 07/13/2022   NA 141 07/13/2022   K 4.1 07/13/2022   CL 104 07/13/2022   CREATININE 0.73 07/13/2022   BUN 15 07/13/2022   CO2 30 07/13/2022   TSH 2.08 01/07/2022    MM 3D SCREEN BREAST BILATERAL  Result Date: 11/13/2022 CLINICAL DATA:  Screening. EXAM: DIGITAL SCREENING BILATERAL MAMMOGRAM WITH TOMOSYNTHESIS AND CAD TECHNIQUE: Bilateral screening digital craniocaudal and mediolateral oblique mammograms were obtained. Bilateral screening digital breast tomosynthesis was performed. The images were evaluated with computer-aided detection. COMPARISON:  Previous exam(s). ACR Breast Density Category d: The breasts are extremely dense, which lowers the sensitivity of mammography. FINDINGS: There are no findings suspicious for malignancy. IMPRESSION: No mammographic evidence of malignancy. A result letter of this screening mammogram will be mailed directly to the patient. RECOMMENDATION: Screening mammogram in one year. (Code:SM-B-01Y) BI-RADS CATEGORY  1: Negative. Electronically Signed   By: Baird Lyons M.D.   On: 11/13/2022 10:40    Assessment & Plan:   Problem List Items Addressed This Visit       Cardiovascular and Mediastinum   Coronary atherosclerosis    On fish oil.  The patient has no interest in using statins  We discussed fish oil use and cough.  There was no connection of the past for the patient. Previously she was not interested in using statins        Respiratory   CAP (community acquired pneumonia)    a recent CAP per UC 2 wks ago Pt took Augmentin and Doxy, Albuterol        Musculoskeletal and Integument    Osteopenia    Chronic Vit D, core exercises,        Other   Stress at home - Primary    Husband w/a bladder cancer; he is in a memory care         No orders of the defined types were placed in this encounter.     Follow-up: Return in about 4 months (around 05/14/2023) for a follow-up visit.  Alex  Karter Haire, MD

## 2023-01-12 NOTE — Assessment & Plan Note (Signed)
Husband w/a bladder cancer; he is in a memory care

## 2023-01-12 NOTE — Assessment & Plan Note (Signed)
On fish oil.  The patient has no interest in using statins  We discussed fish oil use and cough.  There was no connection of the past for the patient. Previously she was not interested in using statins

## 2023-01-12 NOTE — Assessment & Plan Note (Signed)
Chronic Vit D, core exercises,

## 2023-03-11 ENCOUNTER — Ambulatory Visit (INDEPENDENT_AMBULATORY_CARE_PROVIDER_SITE_OTHER): Payer: PPO

## 2023-03-11 VITALS — Ht 65.0 in | Wt 88.0 lb

## 2023-03-11 DIAGNOSIS — Z0000 Encounter for general adult medical examination without abnormal findings: Secondary | ICD-10-CM

## 2023-03-11 NOTE — Progress Notes (Signed)
Subjective:   [name] is a 77 y.o. female who presents for Medicare Annual (Subsequent) preventive examination.  Visit Complete: Virtual  I connected with  [name] on 03/11/23 by a audio enabled telemedicine application and verified that I am speaking with the correct person using two identifiers.  Patient Location: Home  Provider Location: Home Office  I discussed the limitations of evaluation and management by telemedicine. The patient expressed understanding and agreed to proceed.  Patient Medicare AWV questionnaire was completed by the patient on ; I have confirmed that all information answered by patient is correct and no changes since this date.  Review of Systems     Cardiac Risk Factors include: advanced age (>81men, >91 women)     Objective:    Today's Vitals   03/11/23 0859  Weight: 88 lb (39.9 kg)  Height: 5\' 5"  (1.651 m)   Body mass index is 14.64 kg/m.     03/11/2023    9:05 AM 07/21/2019    9:46 AM 11/24/2015    1:44 PM  Advanced Directives  Does Patient Have a Medical Advance Directive? Yes Yes Yes  Type of Estate agent of [place];Living will Healthcare Power of [place];Living will   Copy of Healthcare Power of Attorney in Chart? No - copy requested No - copy requested     Current Medications (verified) Outpatient Encounter Medications as of 03/11/2023  Medication Sig   albuterol (VENTOLIN HFA) 108 (90 Base) MCG/ACT inhaler Inhale 2 puffs into the lungs every 6 (six) hours as needed for wheezing or shortness of breath.   Ascorbic Acid (VITAMIN C) 500 MG CAPS Take 1 capsule by mouth daily   azithromycin (ZITHROMAX Z-PAK) 250 MG tablet As directed   cholecalciferol (VITAMIN D) 1000 UNITS tablet Take 2,000 Units by mouth daily.   famotidine (PEPCID) 20 MG tablet 1 daily   loratadine-pseudoephedrine (CLARITIN-D 12 HOUR) 5-120 MG tablet Take 1 tablet by mouth 2 (two) times daily as needed for allergies.    methylPREDNISolone (MEDROL DOSEPAK) 4 MG TBPK tablet As directed   Multiple Vitamins-Minerals (MULTIVITAMIN ADULTS 50+) TABS    promethazine-dextromethorphan (PROMETHAZINE-DM) 6.25-15 MG/5ML syrup Take 5 mLs by mouth 4 (four) times daily as needed for cough.   No facility-administered encounter medications on file as of 03/11/2023.    Allergies (verified) Fosamax [alendronate sodium] and Zanaflex [tizanidine]   History: Past Medical History:  Diagnosis Date   Adenomatous colon polyp    Atrophic vaginitis    Benign neoplasm of colon 01/18/2008   DIVERTICULOSIS, MILD 10/26/2007   colonoscopy 10/26/07   H pylori ulcer    HEMORRHOIDS, INTERNAL 01/18/2008   OSTEOPENIA 10/2018   T score 2.1 FRAX 14% / 6%   Skin disorder    NOT PARAPSORIASIS--ETIOLOGY UNCLEAR   Ulcer 01/31/06   STOMACH ULCER   Past Surgical History:  Procedure Laterality Date   APPENDECTOMY  1963   COLONOSCOPY W/ BIOPSIES     multiple   ESOPHAGOGASTRODUODENOSCOPY  03/30/2006   GANGLION CYST EXCISION  1972   Right   Gastric Ulcer and GI Bleed (hospitalization)  5/07   LYMPH NODE BIOPSY  2001   Benign from the groin-Inflammatory changes due to her skin condition.   s/p eradication of H. Pylori     confirmed by urea breath teast   SKIN CANCER EXCISION     removed from the forehead   TONSILLECTOMY  1955   TUBAL LIGATION     Family History  Problem Relation  Age of Onset   Breast cancer Mother        Age 30's   Heart disease Mother    Heart disease Father    Breast cancer Maternal Aunt        Age 84's   Heart disease Maternal Grandmother    Colon cancer Maternal Grandfather    Breast cancer Cousin        maternal   Colon cancer Cousin    Colon cancer Maternal Uncle    Breast cancer Cousin    Breast cancer Cousin    Social History   Socioeconomic History   Marital status: Married    Spouse name: Not on file   Number of children: Not on file   Years of education: Not on file   Highest education level:  Not on file  Occupational History   Occupation: Accountant  Tobacco Use   Smoking status: Never   Smokeless tobacco: Never  Vaping Use   Vaping Use: Never used  Substance and Sexual Activity   Alcohol use: Yes    Alcohol/week: 7.0 standard drinks of alcohol    Types: 7 Glasses of wine per week   Drug use: No   Sexual activity: Not Currently    Birth control/protection: Surgical, Post-menopausal    Comment: BTL-1st intercourse 77 yo-Fewer than 5 partners  Other Topics Concern   Not on file  Social History Narrative   Married without children. She is a Systems analyst. Working part-time. 3 coffees daily.   Regular exercise-yes   01/09/2015   Social Determinants of Health   Financial Resource Strain: Low Risk  (03/11/2023)   Overall Financial Resource Strain (CARDIA)    Difficulty of Paying Living Expenses: Not hard at all  Food Insecurity: No Food Insecurity (03/11/2023)   Hunger Vital Sign    Worried About Running Out of Food in the Last Year: Never true    Ran Out of Food in the Last Year: Never true  Transportation Needs: No Transportation Needs (03/11/2023)   PRAPARE - Administrator, Civil Service (Medical): No    Lack of Transportation (Non-Medical): No  Physical Activity: Sufficiently Active (03/11/2023)   Exercise Vital Sign    Days of Exercise per Week: 7 days    Minutes of Exercise per Session: 60 min  Stress: No Stress Concern Present (03/11/2023)   [employer] of Occupational Health - Occupational Stress Questionnaire    Feeling of Stress : Not at all  Social Connections: Moderately Integrated (03/11/2023)   Social Connection and Isolation Panel [NHANES]    Frequency of Communication with Friends and Family: More than three times a week    Frequency of Social Gatherings with Friends and Family: More than three times a week    Attends Religious Services: More than 4 times per year    Active Member of [employer] or Organizations: Yes    Attends  Banker Meetings: More than 4 times per year    Marital Status: Widowed    Tobacco Counseling Counseling given: Not Answered   Clinical Intake:  Pre-visit preparation completed: No  Pain : No/denies pain  Activities of Daily Living    03/11/2023    9:05 AM  In your present state of health, do you have any difficulty performing the following activities:  Hearing? 0  Vision? 0  Difficulty concentrating or making decisions? 0  Walking or climbing stairs? 0  Dressing or bathing? 0  Doing errands, shopping? 0  Preparing Food  and eating ? N  Using the Toilet? N  In the past six months, have you accidently leaked urine? N  Do you have problems with loss of bowel control? N  Managing your Medications? N  Managing your Finances? N  Housekeeping or managing your Housekeeping? N    Patient Care Team: Plotnikov, Georgina Quint, MD as PCP - General Fontaine, Nadyne Coombes, MD (Inactive) as Consulting Physician (Gynecology) Iva Boop, MD as Consulting Physician (Gastroenterology) Venancio Poisson, MD as Consulting Physician (Dermatology) Hunsucker, Lesia Sago, MD as Consulting Physician (Pulmonary Disease)  Indicate any recent Medical Services you may have received from other than [facility] providers in the past year (date may be approximate).     Assessment:   This is a routine wellness examination for [facility].  Hearing/Vision screen Hearing Screening - Comments:: Denies hearing difficulties   Vision Screening - Comments:: Wears reading glasses - up to date with routine eye exams with  [facility]  Dietary issues and exercise activities discussed:     Goals Addressed               This Visit's Progress     No current goals (pt-stated)         Depression Screen    03/11/2023    9:03 AM 01/12/2023    9:28 AM 09/18/2022    8:47 AM 07/13/2022    2:16 PM 01/07/2022    8:26 AM 09/26/2020    8:13 AM 07/21/2019    9:39 AM  PHQ 2/9 Scores  PHQ - 2 Score 0 2 1 1  0 0  1  PHQ- 9 Score 0 6  2       Fall Risk    03/11/2023    9:05 AM 09/18/2022    8:47 AM 07/13/2022    2:15 PM 01/07/2022    8:26 AM 09/26/2020    8:13 AM  Fall Risk   Falls in the past year? 0 0 1 0 0  Comment   PT STATES SHE JUST TRIPPED OVER HER CAT    Number falls in past yr: 0 0 0  0  Injury with Fall? 0 0 0  0  Risk for fall due to : No Fall Risks No Fall Risks Other (Comment)    Risk for fall due to: Comment   TRIPPED OVER HER CAT    Follow up Falls prevention discussed Falls evaluation completed       MEDICARE RISK AT HOME:  Medicare Risk at Home - 03/11/23 0910     Any stairs in or around the home? Yes    If so, are there any without handrails? No    Home free of loose throw rugs in walkways, pet beds, electrical cords, etc? Yes    Adequate lighting in your home to reduce risk of falls? Yes    Life alert? No    Use of a cane, walker or w/c? No    Grab bars in the bathroom? No    Shower chair or bench in shower? Yes    Elevated toilet seat or a handicapped toilet? Yes             TIMED UP AND GO:  Was the test performed?  No    Cognitive Function:        03/11/2023    9:06 AM  6CIT Screen  What Year? 0 points  What month? 0 points  What time? 0 points  Count back from 20 0 points  Months in reverse 0 points  Repeat phrase 0 points  Total Score 0 points    Immunizations Immunization History  Administered Date(s) Administered   COVID-19, mRNA, vaccine(Comirnaty)12 years and older 06/22/2022   Fluad Quad(high Dose 65+) 06/06/2019, 06/20/2020, 06/23/2021, 07/04/2022   Influenza Split 07/16/2011, 07/05/2012   Influenza, High Dose Seasonal PF 06/16/2016, 06/29/2017, 06/27/2018   Influenza,inj,Quad PF,6+ Mos 06/20/2013, 06/21/2014, 06/25/2015   PFIZER(Purple Top)SARS-COV-2 Vaccination 10/16/2019, 11/06/2019, 07/04/2020, 06/09/2021   Pneumococcal Conjugate-13 08/14/2013   Pneumococcal Polysaccharide-23 05/07/2011   Tdap 05/07/2011   Zoster, Live  02/16/2008    TDAP status: Due, Education has been provided regarding the importance of this vaccine. Advised may receive this vaccine at local pharmacy or Health Dept. Aware to provide a copy of the vaccination record if obtained from local pharmacy or Health Dept. Verbalized acceptance and understanding.  Flu Vaccine status: Up to date  Pneumococcal vaccine status: Up to date  Covid-19 vaccine status: Completed vaccines  Qualifies for Shingles Vaccine? Yes   Zostavax completed No   Shingrix Completed?: No.    Education has been provided regarding the importance of this vaccine. Patient has been advised to call insurance company to determine out of pocket expense if they have not yet received this vaccine. Advised may also receive vaccine at local pharmacy or Health Dept. Verbalized acceptance and understanding.  Screening Tests Health Maintenance  Topic Date Due   Zoster Vaccines- Shingrix (1 of 2) Never done   DTaP/Tdap/Td (2 - Td or Tdap) 05/06/2021   COVID-19 Vaccine (6 - 2023-24 season) 03/27/2023 (Originally 08/17/2022)   Colonoscopy  01/12/2024 (Originally 02/06/2020)   INFLUENZA VACCINE  04/22/2023   Medicare Annual Wellness (AWV)  03/10/2024   Pneumonia Vaccine 15+ Years old  Completed   DEXA SCAN  Completed   Hepatitis C Screening  Completed   HPV VACCINES  Aged Out   PAP SMEAR-Modifier  Discontinued    Health Maintenance  Health Maintenance Due  Topic Date Due   Zoster Vaccines- Shingrix (1 of 2) Never done   DTaP/Tdap/Td (2 - Td or Tdap) 05/06/2021    Colorectal cancer screening: Type of screening: Colonoscopy. Completed 5//18/16. Repeat every 5 years  Mammogram status: No longer required due to Age.  Bone Density status: Completed 03/18/21. Results reflect: Bone density results: OSTEOPOROSIS. Repeat every   years.  Lung Cancer Screening: (Low Dose CT Chest recommended if Age 74-80 years, 20 pack-year currently smoking OR have quit w/in 15years.) does not  qualify.     Additional Screening:  Hepatitis C Screening: does qualify; Completed 12/02/15  Vision Screening: Recommended annual ophthalmology exams for early detection of glaucoma and other disorders of the eye. Is the patient up to date with their annual eye exam?  Yes  Who is the provider or what is the name of the office in which the patient attends annual eye exams? [facility] If pt is not established with a provider, would they like to be referred to a provider to establish care? No .   Dental Screening: Recommended annual dental exams for proper oral hygiene   Community Resource Referral / Chronic Care Management:  CRR required this visit?  No   CCM required this visit?  No     Plan:     I have personally reviewed and noted the following in the patient's chart:   Medical and social history Use of alcohol, tobacco or illicit drugs  Current medications and supplements including opioid prescriptions. Patient is not currently taking opioid  prescriptions. Functional ability and status Nutritional status Physical activity Advanced directives List of other physicians Hospitalizations, surgeries, and ER visits in previous 12 months Vitals Screenings to include cognitive, depression, and falls Referrals and appointments  In addition, I have reviewed and discussed with patient certain preventive protocols, quality metrics, and best practice recommendations. A written personalized care plan for preventive services as well as general preventive health recommendations were provided to patient.     Tillie Rung, LPN   [id number]   After Visit Summary: (MyChart) Due to this being a telephonic visit, the after visit summary with patients personalized plan was offered to patient via MyChart   Nurse Notes: None

## 2023-03-11 NOTE — Patient Instructions (Addendum)
[name] , Thank you for taking time to come for your Medicare Wellness Visit. I appreciate your ongoing commitment to your health goals. Please review the following plan we discussed and let me know if I can assist you in the future.   These are the goals we discussed:  Goals       No current goals (pt-stated)        This is a list of the screening recommended for you and due dates:  Health Maintenance  Topic Date Due   Zoster (Shingles) Vaccine (1 of 2) Never done   DTaP/Tdap/Td vaccine (2 - Td or Tdap) 05/06/2021   COVID-19 Vaccine (6 - 2023-24 season) 03/27/2023*   Colon Cancer Screening  01/12/2024*   Flu Shot  04/22/2023   Medicare Annual Wellness Visit  03/10/2024   Pneumonia Vaccine  Completed   DEXA scan (bone density measurement)  Completed   Hepatitis C Screening  Completed   HPV Vaccine  Aged Out   Pap Smear  Discontinued  *Topic was postponed. The date shown is not the original due date.    Advanced directives: Please bring a copy of your health care power of attorney and living will to the office to be added to your chart at your convenience.   Conditions/risks identified: None  Next appointment: Follow up in one year for your annual wellness visit    Preventive Care 65 Years and Older, Female Preventive care refers to lifestyle choices and visits with your health care provider that can promote health and wellness. What does preventive care include? A yearly physical exam. This is also called an annual well check. Dental exams once or twice a year. Routine eye exams. Ask your health care provider how often you should have your eyes checked. Personal lifestyle choices, including: Daily care of your teeth and gums. Regular physical activity. Eating a healthy diet. Avoiding tobacco and drug use. Limiting alcohol use. Practicing safe sex. Taking low-dose aspirin every day. Taking vitamin and mineral supplements as recommended by your health care  provider. What happens during an annual well check? The services and screenings done by your health care provider during your annual well check will depend on your age, overall health, lifestyle risk factors, and family history of disease. Counseling  Your health care provider may ask you questions about your: Alcohol use. Tobacco use. Drug use. Emotional well-being. Home and relationship well-being. Sexual activity. Eating habits. History of falls. Memory and ability to understand (cognition). Work and work Astronomer. Reproductive health. Screening  You may have the following tests or measurements: Height, weight, and BMI. Blood pressure. Lipid and cholesterol levels. These may be checked every 5 years, or more frequently if you are over 25 years old. Skin check. Lung cancer screening. You may have this screening every year starting at age 22 if you have a 30-pack-year history of smoking and currently smoke or have quit within the past 15 years. Fecal occult blood test (FOBT) of the stool. You may have this test every year starting at age 66. Flexible sigmoidoscopy or colonoscopy. You may have a sigmoidoscopy every 5 years or a colonoscopy every 10 years starting at age 40. Hepatitis C blood test. Hepatitis B blood test. Sexually transmitted disease (STD) testing. Diabetes screening. This is done by checking your blood sugar (glucose) after you have not eaten for a while (fasting). You may have this done every 1-3 years. Bone density scan. This is done to screen for osteoporosis. You may have  this done starting at age 58. Mammogram. This may be done every 1-2 years. Talk to your health care provider about how often you should have regular mammograms. Talk with your health care provider about your test results, treatment options, and if necessary, the need for more tests. Vaccines  Your health care provider may recommend certain vaccines, such as: Influenza vaccine. This is  recommended every year. Tetanus, diphtheria, and acellular pertussis (Tdap, Td) vaccine. You may need a Td booster every 10 years. Zoster vaccine. You may need this after age 61. Pneumococcal 13-valent conjugate (PCV13) vaccine. One dose is recommended after age 23. Pneumococcal polysaccharide (PPSV23) vaccine. One dose is recommended after age 51. Talk to your health care provider about which screenings and vaccines you need and how often you need them. This information is not intended to replace advice given to you by your health care provider. Make sure you discuss any questions you have with your health care provider. Document Released: 10/04/2015 Document Revised: 05/27/2016 Document Reviewed: 07/09/2015 Elsevier Interactive Patient Education  2017 [place] Prevention in the Home Falls can cause injuries. They can happen to people of all ages. There are many things you can do to make your home safe and to help prevent falls. What can I do on the outside of my home? Regularly fix the edges of walkways and driveways and fix any cracks. Remove anything that might make you trip as you walk through a door, such as a raised step or threshold. Trim any bushes or trees on the path to your home. Use bright outdoor lighting. Clear any walking paths of anything that might make someone trip, such as rocks or tools. Regularly check to see if handrails are loose or broken. Make sure that both sides of any steps have handrails. Any raised decks and porches should have guardrails on the edges. Have any leaves, snow, or ice cleared regularly. Use sand or salt on walking paths during winter. Clean up any spills in your garage right away. This includes oil or grease spills. What can I do in the bathroom? Use night lights. Install grab bars by the toilet and in the tub and shower. Do not use towel bars as grab bars. Use non-skid mats or decals in the tub or shower. If you need to sit down in  the shower, use a plastic, non-slip stool. Keep the floor dry. Clean up any water that spills on the floor as soon as it happens. Remove soap buildup in the tub or shower regularly. Attach bath mats securely with double-sided non-slip rug tape. Do not have throw rugs and other things on the floor that can make you trip. What can I do in the bedroom? Use night lights. Make sure that you have a light by your bed that is easy to reach. Do not use any sheets or blankets that are too big for your bed. They should not hang down onto the floor. Have a firm chair that has side arms. You can use this for support while you get dressed. Do not have throw rugs and other things on the floor that can make you trip. What can I do in the kitchen? Clean up any spills right away. Avoid walking on wet floors. Keep items that you use a lot in easy-to-reach places. If you need to reach something above you, use a strong step stool that has a grab bar. Keep electrical cords out of the way. Do not use floor polish or  wax that makes floors slippery. If you must use wax, use non-skid floor wax. Do not have throw rugs and other things on the floor that can make you trip. What can I do with my stairs? Do not leave any items on the stairs. Make sure that there are handrails on both sides of the stairs and use them. Fix handrails that are broken or loose. Make sure that handrails are as long as the stairways. Check any carpeting to make sure that it is firmly attached to the stairs. Fix any carpet that is loose or worn. Avoid having throw rugs at the top or bottom of the stairs. If you do have throw rugs, attach them to the floor with carpet tape. Make sure that you have a light switch at the top of the stairs and the bottom of the stairs. If you do not have them, ask someone to add them for you. What else can I do to help prevent falls? Wear shoes that: Do not have high heels. Have rubber bottoms. Are comfortable  and fit you well. Are closed at the toe. Do not wear sandals. If you use a stepladder: Make sure that it is fully opened. Do not climb a closed stepladder. Make sure that both sides of the stepladder are locked into place. Ask someone to hold it for you, if possible. Clearly mark and make sure that you can see: Any grab bars or handrails. First and last steps. Where the edge of each step is. Use tools that help you move around (mobility aids) if they are needed. These include: Canes. Walkers. Scooters. Crutches. Turn on the lights when you go into a dark area. Replace any light bulbs as soon as they burn out. Set up your furniture so you have a clear path. Avoid moving your furniture around. If any of your floors are uneven, fix them. If there are any pets around you, be aware of where they are. Review your medicines with your doctor. Some medicines can make you feel dizzy. This can increase your chance of falling. Ask your doctor what other things that you can do to help prevent falls. This information is not intended to replace advice given to you by your health care provider. Make sure you discuss any questions you have with your health care provider. Document Released: 07/04/2009 Document Revised: 02/13/2016 Document Reviewed: 10/12/2014 Elsevier Interactive Patient Education  2017 [employer].

## 2023-03-17 ENCOUNTER — Encounter: Payer: Self-pay | Admitting: Internal Medicine

## 2023-04-04 DIAGNOSIS — S63501A Unspecified sprain of right wrist, initial encounter: Secondary | ICD-10-CM | POA: Diagnosis not present

## 2023-04-21 ENCOUNTER — Encounter (INDEPENDENT_AMBULATORY_CARE_PROVIDER_SITE_OTHER): Payer: Self-pay

## 2023-05-12 ENCOUNTER — Encounter: Payer: Self-pay | Admitting: Internal Medicine

## 2023-05-12 ENCOUNTER — Ambulatory Visit (INDEPENDENT_AMBULATORY_CARE_PROVIDER_SITE_OTHER): Payer: PPO | Admitting: Internal Medicine

## 2023-05-12 VITALS — BP 112/60 | HR 67 | Temp 98.2°F | Ht 65.0 in | Wt 87.0 lb

## 2023-05-12 DIAGNOSIS — F4321 Adjustment disorder with depressed mood: Secondary | ICD-10-CM | POA: Diagnosis not present

## 2023-05-12 DIAGNOSIS — L853 Xerosis cutis: Secondary | ICD-10-CM | POA: Diagnosis not present

## 2023-05-12 DIAGNOSIS — R053 Chronic cough: Secondary | ICD-10-CM | POA: Diagnosis not present

## 2023-05-12 DIAGNOSIS — F4320 Adjustment disorder, unspecified: Secondary | ICD-10-CM | POA: Insufficient documentation

## 2023-05-12 NOTE — Assessment & Plan Note (Signed)
Use Aquaphore

## 2023-05-12 NOTE — Progress Notes (Signed)
Subjective:  Patient ID: [name], female    DOB: Feb 01, 1946  Age: 77 y.o. MRN: [id number]  CC: Follow-up ( f/u)   HPI [name] presents for a f/u of her chronic cough  Husband died in 2023/01/26-she is grieving C/o dry skin  Outpatient Medications Prior to Visit  Medication Sig Dispense Refill   albuterol (VENTOLIN HFA) 108 (90 Base) MCG/ACT inhaler Inhale 2 puffs into the lungs every 6 (six) hours as needed for wheezing or shortness of breath. 8 g 2   Ascorbic Acid (VITAMIN C) 500 MG CAPS Take 1 capsule by mouth daily     cholecalciferol (VITAMIN D) 1000 UNITS tablet Take 2,000 Units by mouth daily.     famotidine (PEPCID) 20 MG tablet 1 daily 30 tablet 5   loratadine-pseudoephedrine (CLARITIN-D 12 HOUR) 5-120 MG tablet Take 1 tablet by mouth 2 (two) times daily as needed for allergies. 60 tablet 3   Multiple Vitamins-Minerals (MULTIVITAMIN ADULTS 50+) TABS      methylPREDNISolone (MEDROL DOSEPAK) 4 MG TBPK tablet As directed 21 tablet 0   promethazine-dextromethorphan (PROMETHAZINE-DM) 6.25-15 MG/5ML syrup Take 5 mLs by mouth 4 (four) times daily as needed for cough. 240 mL 0   azithromycin (ZITHROMAX Z-PAK) 250 MG tablet As directed (Patient not taking: Reported on 05/12/2023) 6 tablet 0   No facility-administered medications prior to visit.    ROS: Review of Systems  Constitutional:  Negative for activity change, appetite change, chills, fatigue and unexpected weight change.  HENT:  Negative for congestion, mouth sores and sinus pressure.   Eyes:  Negative for visual disturbance.  Respiratory:  Negative for cough and chest tightness.   Gastrointestinal:  Negative for abdominal pain and nausea.  Genitourinary:  Negative for difficulty urinating, frequency and vaginal pain.  Musculoskeletal:  Negative for back pain and gait problem.  Skin:  Negative for pallor and rash.  Neurological:  Negative for dizziness, tremors, weakness, numbness and headaches.   Psychiatric/Behavioral:  Negative for confusion and sleep disturbance.     Objective:  BP 112/60 (BP Location: Left Arm, Patient Position: Sitting, Cuff Size: Large)   Pulse 67   Temp 98.2 F (36.8 C) (Oral)   Ht 5\' 5"  (1.651 m)   Wt 87 lb (39.5 kg)   SpO2 97%   BMI 14.48 kg/m   BP Readings from Last 3 Encounters:  05/12/23 112/60  01/12/23 90/60  09/18/22 124/68    Wt Readings from Last 3 Encounters:  05/12/23 87 lb (39.5 kg)  03/11/23 88 lb (39.9 kg)  01/12/23 88 lb 9.6 oz (40.2 kg)    Physical Exam Constitutional:      General: She is not in acute distress.    Appearance: [name].  HENT:     Head: Normocephalic.     Right Ear: External ear normal.     Left Ear: External ear normal.     Nose: Nose normal.  Eyes:     General:        Right eye: No discharge.        Left eye: No discharge.     Conjunctiva/sclera: Conjunctivae normal.     Pupils: Pupils are equal, round, and reactive to light.  Neck:     Thyroid: No thyromegaly.     Vascular: No JVD.     Trachea: No tracheal deviation.  Cardiovascular:     Rate and Rhythm: Normal rate and regular rhythm.     Heart sounds: Normal heart  sounds.  Pulmonary:     Effort: No respiratory distress.     Breath sounds: No stridor. No wheezing.  Abdominal:     General: Bowel sounds are normal. There is no distension.     Palpations: Abdomen is soft. There is no mass.     Tenderness: There is no abdominal tenderness. There is no guarding or rebound.  Musculoskeletal:        General: No tenderness.     Cervical back: Normal range of motion and neck supple. No rigidity.  Lymphadenopathy:     Cervical: No cervical adenopathy.  Skin:    Findings: No erythema or rash.  Neurological:     Mental Status: She is oriented to person, place, and time.     Cranial Nerves: No cranial nerve deficit.     Motor: No abnormal muscle tone.     Coordination: Coordination normal.     Deep Tendon Reflexes: Reflexes  normal.  Psychiatric:        Behavior: Behavior normal.        Thought Content: Thought content normal.        Judgment: Judgment normal.      A total time of 30 minutes was spent preparing to see the patient, reviewing tests, x-rays, operative reports and other medical records.  Also, obtaining history and performing comprehensive physical exam.  Additionally, counseling the patient regarding the above listed issues - grief...  Lab Results  Component Value Date   WBC 8.9 01/07/2022   HGB 13.5 01/07/2022   HCT 41.0 01/07/2022   PLT 328.0 01/07/2022   GLUCOSE 99 07/13/2022   CHOL 185 01/07/2022   TRIG 59.0 01/07/2022   HDL 89.60 01/07/2022   LDLDIRECT 107.6 08/15/2012   LDLCALC 84 01/07/2022   ALT 16 07/13/2022   AST 42 (H) 07/13/2022   NA 141 07/13/2022   K 4.1 07/13/2022   CL 104 07/13/2022   CREATININE 0.73 07/13/2022   BUN 15 07/13/2022   CO2 30 07/13/2022   TSH 2.08 01/07/2022    MM 3D SCREEN BREAST BILATERAL  Result Date: 11/13/2022 CLINICAL DATA:  Screening. EXAM: DIGITAL SCREENING BILATERAL MAMMOGRAM WITH TOMOSYNTHESIS AND CAD TECHNIQUE: Bilateral screening digital craniocaudal and mediolateral oblique mammograms were obtained. Bilateral screening digital breast tomosynthesis was performed. The images were evaluated with computer-aided detection. COMPARISON:  Previous exam(s). ACR Breast Density Category d: The breasts are extremely dense, which lowers the sensitivity of mammography. FINDINGS: There are no findings suspicious for malignancy. IMPRESSION: No mammographic evidence of malignancy. A result letter of this screening mammogram will be mailed directly to the patient. RECOMMENDATION: Screening mammogram in one year. (Code:SM-B-01Y) BI-RADS CATEGORY  1: Negative. Electronically Signed   By: Baird Lyons M.D.   On: 11/13/2022 10:40    Assessment & Plan:   Problem List Items Addressed This Visit     Chronic cough    Doing well       Grief reaction - Primary     Husband died in 05-05-2024Discussed      Dry skin    Use Aquaphore         No orders of the defined types were placed in this encounter.     Follow-up: Return in about 6 months (around 11/12/2023) for a follow-up visit.  Sonda Primes, MD

## 2023-05-12 NOTE — Assessment & Plan Note (Signed)
Doing well 

## 2023-05-12 NOTE — Assessment & Plan Note (Addendum)
Husband died in May 2024 Discussed

## 2023-08-10 DIAGNOSIS — M25561 Pain in right knee: Secondary | ICD-10-CM | POA: Diagnosis not present

## 2023-08-23 ENCOUNTER — Ambulatory Visit: Payer: PPO | Admitting: Internal Medicine

## 2023-08-24 ENCOUNTER — Encounter: Payer: Self-pay | Admitting: Internal Medicine

## 2023-08-24 ENCOUNTER — Ambulatory Visit (INDEPENDENT_AMBULATORY_CARE_PROVIDER_SITE_OTHER): Payer: PPO | Admitting: Internal Medicine

## 2023-08-24 VITALS — BP 118/68 | HR 64 | Temp 98.0°F | Ht 65.0 in | Wt 88.0 lb

## 2023-08-24 DIAGNOSIS — L853 Xerosis cutis: Secondary | ICD-10-CM | POA: Diagnosis not present

## 2023-08-24 DIAGNOSIS — R053 Chronic cough: Secondary | ICD-10-CM | POA: Diagnosis not present

## 2023-08-24 DIAGNOSIS — L299 Pruritus, unspecified: Secondary | ICD-10-CM

## 2023-08-24 LAB — URINALYSIS
Bilirubin Urine: NEGATIVE
Hgb urine dipstick: NEGATIVE
Ketones, ur: NEGATIVE
Leukocytes,Ua: NEGATIVE
Nitrite: NEGATIVE
Specific Gravity, Urine: <=1.005 — AB (ref 1.000–1.030)
Total Protein, Urine: NEGATIVE
Urine Glucose: NEGATIVE
Urobilinogen, UA: 0.2 (ref 0.0–1.0)
pH: 6 (ref 5.0–8.0)

## 2023-08-24 LAB — COMPREHENSIVE METABOLIC PANEL
ALT: 23 U/L (ref 0–35)
AST: 49 U/L — ABNORMAL HIGH (ref 0–37)
Albumin: 4.4 g/dL (ref 3.5–5.2)
Alkaline Phosphatase: 85 U/L (ref 39–117)
BUN: 16 mg/dL (ref 6–23)
CO2: 33 meq/L — ABNORMAL HIGH (ref 19–32)
Calcium: 9.5 mg/dL (ref 8.4–10.5)
Chloride: 101 meq/L (ref 96–112)
Creatinine, Ser: 0.64 mg/dL (ref 0.40–1.20)
GFR: 84.96 mL/min (ref >60.00)
Glucose, Bld: 104 mg/dL — ABNORMAL HIGH (ref 70–99)
Potassium: 4.1 meq/L (ref 3.5–5.1)
Sodium: 138 meq/L (ref 135–145)
Total Bilirubin: 0.6 mg/dL (ref 0.2–1.2)
Total Protein: 6.9 g/dL (ref 6.0–8.3)

## 2023-08-24 LAB — TSH: TSH: 1.62 u[IU]/mL (ref 0.35–5.50)

## 2023-08-24 LAB — CBC WITH DIFFERENTIAL/PLATELET
Basophils Absolute: 0 10*3/uL (ref 0.0–0.1)
Basophils Relative: 0.6 % (ref 0.0–3.0)
Eosinophils Absolute: 0.4 10*3/uL (ref 0.0–0.7)
Eosinophils Relative: 6.2 % — ABNORMAL HIGH (ref 0.0–5.0)
HCT: 42.7 % (ref 36.0–46.0)
Hemoglobin: 14.1 g/dL (ref 12.0–15.0)
Lymphocytes Relative: 21.5 % (ref 12.0–46.0)
Lymphs Abs: 1.4 10*3/uL (ref 0.7–4.0)
MCHC: 33 g/dL (ref 30.0–36.0)
MCV: 95.1 fL (ref 78.0–100.0)
Monocytes Absolute: 0.5 10*3/uL (ref 0.1–1.0)
Monocytes Relative: 8 % (ref 3.0–12.0)
Neutro Abs: 4 10*3/uL (ref 1.4–7.7)
Neutrophils Relative %: 63.7 % (ref 43.0–77.0)
Platelets: 334 10*3/uL (ref 150.0–400.0)
RBC: 4.49 Mil/uL (ref 3.87–5.11)
RDW: 12.5 % (ref 11.5–15.5)
WBC: 6.3 10*3/uL (ref 4.0–10.5)

## 2023-08-24 NOTE — Progress Notes (Addendum)
Subjective:  Patient ID: [name], female    DOB: 11-04-1945  Age: 77 y.o. MRN: [id number]  CC: Medical Management of Chronic Issues (Itchy spots on scalp for about 2 months )   HPI PAYSLEIGH FRYMIER presents for itching x 1-2 months - areas of itching, no sweating  Outpatient Medications Prior to Visit  Medication Sig Dispense Refill   albuterol (VENTOLIN HFA) 108 (90 Base) MCG/ACT inhaler Inhale 2 puffs into the lungs every 6 (six) hours as needed for wheezing or shortness of breath. 8 g 2   Ascorbic Acid (VITAMIN C) 500 MG CAPS Take 1 capsule by mouth daily     cholecalciferol (VITAMIN D) 1000 UNITS tablet Take 2,000 Units by mouth daily.     Multiple Vitamins-Minerals (MULTIVITAMIN ADULTS 50+) TABS      famotidine (PEPCID) 20 MG tablet 1 daily 30 tablet 5   loratadine-pseudoephedrine (CLARITIN-D 12 HOUR) 5-120 MG tablet Take 1 tablet by mouth 2 (two) times daily as needed for allergies. 60 tablet 3   No facility-administered medications prior to visit.    ROS: Review of Systems  Constitutional:  Negative for activity change, appetite change, chills, fatigue and unexpected weight change.  HENT:  Negative for congestion, mouth sores and sinus pressure.   Eyes:  Negative for visual disturbance.  Respiratory:  Positive for cough. Negative for chest tightness and shortness of breath.   Gastrointestinal:  Negative for abdominal pain and nausea.  Genitourinary:  Negative for difficulty urinating, frequency and vaginal pain.  Musculoskeletal:  Negative for back pain and gait problem.  Skin:  Positive for rash. Negative for pallor.  Neurological:  Negative for dizziness, tremors, weakness, numbness and headaches.  Hematological:  Does not bruise/bleed easily.  Psychiatric/Behavioral:  Negative for confusion and sleep disturbance. The patient is not nervous/anxious.     Objective:  BP 118/68 (BP Location: Right Arm, Patient Position: Sitting, Cuff Size: Normal)   Pulse 64    Temp 98 F (36.7 C) (Oral)   Ht 5\' 5"  (1.651 m)   Wt 88 lb (39.9 kg)   SpO2 97%   BMI 14.64 kg/m   BP Readings from Last 3 Encounters:  08/24/23 118/68  05/12/23 112/60  01/12/23 90/60    Wt Readings from Last 3 Encounters:  08/24/23 88 lb (39.9 kg)  05/12/23 87 lb (39.5 kg)  03/11/23 88 lb (39.9 kg)    Physical Exam Constitutional:      General: She is not in acute distress.    Appearance: [name].  HENT:     Head: Normocephalic.     Right Ear: External ear normal.     Left Ear: External ear normal.     Nose: Nose normal.  Eyes:     General:        Right eye: No discharge.        Left eye: No discharge.     Conjunctiva/sclera: Conjunctivae normal.     Pupils: Pupils are equal, round, and reactive to light.  Neck:     Thyroid: No thyromegaly.     Vascular: No JVD.     Trachea: No tracheal deviation.  Cardiovascular:     Rate and Rhythm: Normal rate and regular rhythm.     Heart sounds: Normal heart sounds.  Pulmonary:     Effort: No respiratory distress.     Breath sounds: No stridor. No wheezing.  Abdominal:     General: Bowel sounds are normal. There is no distension.  Palpations: Abdomen is soft. There is no mass.     Tenderness: There is no abdominal tenderness. There is no guarding or rebound.  Musculoskeletal:        General: No tenderness.     Cervical back: Normal range of motion and neck supple. No rigidity.     Right lower leg: No edema.     Left lower leg: No edema.  Lymphadenopathy:     Cervical: No cervical adenopathy.  Skin:    Findings: No erythema or rash.  Neurological:     Mental Status: She is oriented to person, place, and time.     Cranial Nerves: No cranial nerve deficit.     Motor: No abnormal muscle tone.     Coordination: Coordination normal.     Gait: Gait normal.     Deep Tendon Reflexes: Reflexes normal.  Psychiatric:        Behavior: Behavior normal.        Thought Content: Thought content normal.         Judgment: Judgment normal.     Lab Results  Component Value Date   WBC 8.9 01/07/2022   HGB 13.5 01/07/2022   HCT 41.0 01/07/2022   PLT 328.0 01/07/2022   GLUCOSE 99 07/13/2022   CHOL 185 01/07/2022   TRIG 59.0 01/07/2022   HDL 89.60 01/07/2022   LDLDIRECT 107.6 08/15/2012   LDLCALC 84 01/07/2022   ALT 16 07/13/2022   AST 42 (H) 07/13/2022   NA 141 07/13/2022   K 4.1 07/13/2022   CL 104 07/13/2022   CREATININE 0.73 07/13/2022   BUN 15 07/13/2022   CO2 30 07/13/2022   TSH 2.08 01/07/2022    MM 3D SCREEN BREAST BILATERAL  Result Date: 11/13/2022 CLINICAL DATA:  Screening. EXAM: DIGITAL SCREENING BILATERAL MAMMOGRAM WITH TOMOSYNTHESIS AND CAD TECHNIQUE: Bilateral screening digital craniocaudal and mediolateral oblique mammograms were obtained. Bilateral screening digital breast tomosynthesis was performed. The images were evaluated with computer-aided detection. COMPARISON:  Previous exam(s). ACR Breast Density Category d: The breasts are extremely dense, which lowers the sensitivity of mammography. FINDINGS: There are no findings suspicious for malignancy. IMPRESSION: No mammographic evidence of malignancy. A result letter of this screening mammogram will be mailed directly to the patient. RECOMMENDATION: Screening mammogram in one year. (Code:SM-B-01Y) BI-RADS CATEGORY  1: Negative. Electronically Signed   By: Baird Lyons M.D.   On: 11/13/2022 10:40    Assessment & Plan:   Problem List Items Addressed This Visit     Chronic cough    Doing well; no change       Dry skin    Start Claritin 10 mg  day , use [facility] Planning to move      Relevant Orders   Comprehensive metabolic panel   CBC with Differential/Platelet   TSH   Urinalysis   Pruritus - Primary    ?etiology Dry skin, stress Labs Start Claritin 10 mg  day , use [facility] Planning to move      Relevant Orders   Comprehensive metabolic panel   CBC with Differential/Platelet   TSH   Urinalysis       No orders of the defined types were placed in this encounter.     Follow-up: Return in about 3 months (around 11/22/2023) for a follow-up visit.

## 2023-08-24 NOTE — Assessment & Plan Note (Signed)
?  etiology Dry skin, stress Labs Start Claritin 10 mg  day , use [facility] Planning to move

## 2023-08-24 NOTE — Assessment & Plan Note (Signed)
 Doing well no change.

## 2023-08-24 NOTE — Assessment & Plan Note (Signed)
Start Claritin 10 mg  day , use [facility] Planning to move

## 2023-08-29 ENCOUNTER — Encounter: Payer: Self-pay | Admitting: Internal Medicine

## 2023-09-02 NOTE — Addendum Note (Signed)
Addended by: Tresa Garter on: 09/02/2023 07:38 AM   Modules accepted: Level of Service

## 2023-09-03 DIAGNOSIS — H2513 Age-related nuclear cataract, bilateral: Secondary | ICD-10-CM | POA: Diagnosis not present

## 2023-09-03 DIAGNOSIS — H524 Presbyopia: Secondary | ICD-10-CM | POA: Diagnosis not present

## 2023-11-15 ENCOUNTER — Ambulatory Visit: Payer: PPO | Admitting: Internal Medicine

## 2023-11-16 ENCOUNTER — Encounter: Payer: Self-pay | Admitting: Internal Medicine

## 2023-11-16 ENCOUNTER — Ambulatory Visit (INDEPENDENT_AMBULATORY_CARE_PROVIDER_SITE_OTHER): Payer: PPO | Admitting: Internal Medicine

## 2023-11-16 VITALS — BP 110/70 | HR 64 | Temp 98.0°F | Ht 65.0 in | Wt 90.0 lb

## 2023-11-16 DIAGNOSIS — K2970 Gastritis, unspecified, without bleeding: Secondary | ICD-10-CM

## 2023-11-16 DIAGNOSIS — M8580 Other specified disorders of bone density and structure, unspecified site: Secondary | ICD-10-CM

## 2023-11-16 DIAGNOSIS — R053 Chronic cough: Secondary | ICD-10-CM | POA: Diagnosis not present

## 2023-11-16 DIAGNOSIS — I2583 Coronary atherosclerosis due to lipid rich plaque: Secondary | ICD-10-CM | POA: Diagnosis not present

## 2023-11-16 DIAGNOSIS — K2990 Gastroduodenitis, unspecified, without bleeding: Secondary | ICD-10-CM

## 2023-11-16 NOTE — Assessment & Plan Note (Signed)
 Chronic Vit D, core exercises Other options discussed

## 2023-11-16 NOTE — Progress Notes (Signed)
 Subjective:  Patient ID: TENNILLE [name], female    DOB: Jul 25, 1946  Age: 78 y.o. MRN: [id number]  CC: Medical Management of Chronic Issues (6 mnth f/u)   HPI [name] presents for wheezing, wt loss,  Pt moved to [place]   Outpatient Medications Prior to Visit  Medication Sig Dispense Refill   Ascorbic Acid (VITAMIN C) 500 MG CAPS Take 1 capsule by mouth daily     cholecalciferol (VITAMIN D) 1000 UNITS tablet Take 2,000 Units by mouth daily.     Multiple Vitamins-Minerals (MULTIVITAMIN ADULTS 50+) TABS      albuterol (VENTOLIN HFA) 108 (90 Base) MCG/ACT inhaler Inhale 2 puffs into the lungs every 6 (six) hours as needed for wheezing or shortness of breath. 8 g 2   No facility-administered medications prior to visit.    ROS: Review of Systems  Constitutional:  Negative for activity change, appetite change, chills, fatigue and unexpected weight change.  HENT:  Negative for congestion, mouth sores and sinus pressure.   Eyes:  Negative for visual disturbance.  Respiratory:  Negative for cough and chest tightness.   Cardiovascular:  Negative for palpitations and leg swelling.  Gastrointestinal:  Negative for abdominal pain and nausea.  Genitourinary:  Negative for difficulty urinating, frequency and vaginal pain.  Musculoskeletal:  Negative for back pain and gait problem.  Skin:  Negative for pallor and rash.  Neurological:  Negative for dizziness, tremors, weakness, numbness and headaches.  Psychiatric/Behavioral:  Negative for confusion and sleep disturbance. The patient is not nervous/anxious.     Objective:  BP 110/70   Pulse 64   Temp 98 F (36.7 C) (Oral)   Ht 5\' 5"  (1.651 m)   Wt 90 lb (40.8 kg)   SpO2 96%   BMI 14.98 kg/m   BP Readings from Last 3 Encounters:  11/16/23 110/70  08/24/23 118/68  05/12/23 112/60    Wt Readings from Last 3 Encounters:  11/16/23 90 lb (40.8 kg)  08/24/23 88 lb (39.9 kg)  05/12/23 87 lb (39.5 kg)    Physical  Exam Constitutional:      General: She is not in acute distress.    Appearance: Normal appearance. [name].  HENT:     Head: Normocephalic.     Right Ear: External ear normal.     Left Ear: External ear normal.     Nose: Nose normal.  Eyes:     General:        Right eye: No discharge.        Left eye: No discharge.     Conjunctiva/sclera: Conjunctivae normal.     Pupils: Pupils are equal, round, and reactive to light.  Neck:     Thyroid: No thyromegaly.     Vascular: No JVD.     Trachea: No tracheal deviation.  Cardiovascular:     Rate and Rhythm: Normal rate and regular rhythm.     Heart sounds: Normal heart sounds.  Pulmonary:     Effort: No respiratory distress.     Breath sounds: No stridor. No wheezing.  Abdominal:     General: Bowel sounds are normal. There is no distension.     Palpations: Abdomen is soft. There is no mass.     Tenderness: There is no abdominal tenderness. There is no guarding or rebound.  Musculoskeletal:        General: No tenderness.     Cervical back: Normal range of motion and neck supple. No rigidity.  Lymphadenopathy:     Cervical: No cervical adenopathy.  Skin:    Findings: No erythema or rash.  Neurological:     Cranial Nerves: No cranial nerve deficit.     Motor: No abnormal muscle tone.     Coordination: Coordination normal.     Deep Tendon Reflexes: Reflexes normal.  Psychiatric:        Behavior: Behavior normal.        Thought Content: Thought content normal.        Judgment: Judgment normal.   Thin Cervical adenopathy B  Lab Results  Component Value Date   WBC 6.3 08/24/2023   HGB 14.1 08/24/2023   HCT 42.7 08/24/2023   PLT 334.0 08/24/2023   GLUCOSE 104 (H) 08/24/2023   CHOL 185 01/07/2022   TRIG 59.0 01/07/2022   HDL 89.60 01/07/2022   LDLDIRECT 107.6 08/15/2012   LDLCALC 84 01/07/2022   ALT 23 08/24/2023   AST 49 (H) 08/24/2023   NA 138 08/24/2023   K 4.1 08/24/2023   CL 101 08/24/2023    CREATININE 0.64 08/24/2023   BUN 16 08/24/2023   CO2 33 (H) 08/24/2023   TSH 1.62 08/24/2023    MM 3D SCREEN BREAST BILATERAL Result Date: 11/13/2022 CLINICAL DATA:  Screening. EXAM: DIGITAL SCREENING BILATERAL MAMMOGRAM WITH TOMOSYNTHESIS AND CAD TECHNIQUE: Bilateral screening digital craniocaudal and mediolateral oblique mammograms were obtained. Bilateral screening digital breast tomosynthesis was performed. The images were evaluated with computer-aided detection. COMPARISON:  Previous exam(s). ACR Breast Density Category d: The breasts are extremely dense, which lowers the sensitivity of mammography. FINDINGS: There are no findings suspicious for malignancy. IMPRESSION: No mammographic evidence of malignancy. A result letter of this screening mammogram will be mailed directly to the patient. RECOMMENDATION: Screening mammogram in one year. (Code:SM-B-01Y) BI-RADS CATEGORY  1: Negative. Electronically Signed   By: Baird Lyons M.D.   On: 11/13/2022 10:40    Assessment & Plan:   Problem List Items Addressed This Visit     Gastritis and gastroduodenitis   Osteopenia   Chronic Vit D, core exercises Other options discussed      Chronic cough - Primary   Chronic wheezing at night; no change (S/p pill aspiration - 2021. Chronic wheezing at night; no change) We can repeat CXR or CT, get neck CT - not interested CBC, CMET      Relevant Orders   CBC with Differential/Platelet   Coronary atherosclerosis   Off fish oil.  The patient has no interest in using statins  There was no connection of the past for the patient. Not interested in using statins      Relevant Orders   Comprehensive metabolic panel   CBC with Differential/Platelet      No orders of the defined types were placed in this encounter.     Follow-up: Return in about 6 months (around 05/15/2024) for Wellness Exam.  Sonda Primes, MD

## 2023-11-16 NOTE — Assessment & Plan Note (Addendum)
 Off fish oil.  The patient has no interest in using statins  There was no connection of the past for the patient. Not interested in using statins

## 2023-11-16 NOTE — Assessment & Plan Note (Addendum)
 Chronic wheezing at night; no change (S/p pill aspiration - 2021. Chronic wheezing at night; no change) We can repeat CXR or CT, get neck CT - not interested CBC, CMET

## 2023-12-06 ENCOUNTER — Other Ambulatory Visit: Payer: Self-pay | Admitting: Internal Medicine

## 2023-12-06 DIAGNOSIS — Z1231 Encounter for screening mammogram for malignant neoplasm of breast: Secondary | ICD-10-CM

## 2023-12-21 ENCOUNTER — Ambulatory Visit
Admission: RE | Admit: 2023-12-21 | Discharge: 2023-12-21 | Disposition: A | Discharge status: Home / Self Care | Source: Ambulatory Visit | Attending: Internal Medicine | Admitting: Internal Medicine

## 2023-12-21 DIAGNOSIS — Z1231 Encounter for screening mammogram for malignant neoplasm of breast: Secondary | ICD-10-CM

## 2024-03-16 ENCOUNTER — Ambulatory Visit: Payer: PPO

## 2024-03-16 VITALS — Ht 65.0 in | Wt 90.0 lb

## 2024-03-16 DIAGNOSIS — Z0000 Encounter for general adult medical examination without abnormal findings: Secondary | ICD-10-CM

## 2024-03-16 NOTE — Patient Instructions (Signed)
 [name] , Thank you for taking time out of your busy schedule to complete your Annual Wellness Visit with me. I enjoyed our conversation and look forward to speaking with you again next year. I, as well as your care team,  appreciate your ongoing commitment to your health goals. Please review the following plan we discussed and let me know if I can assist you in the future. Your Game plan/ To Do List    Follow up Visits: Next Medicare AWV with our clinical staff: 03/22/2025.   Have you seen your provider in the last 6 months (3 months if uncontrolled diabetes)? Yes Next Office Visit with your provider: 05/16/2024.  Clinician Recommendations:  Aim for 30 minutes of exercise or brisk walking, 6-8 glasses of water, and 5 servings of fruits and vegetables each day. You are due for a tetanus vaccine and can get that done at any local pharmacy.  Keep up the good work.      This is a list of the screening recommended for you and due dates:  Health Maintenance  Topic Date Due   Pap Smear  08/04/2015   Colon Cancer Screening  02/06/2020   DTaP/Tdap/Td vaccine (2 - Td or Tdap) 05/06/2021   COVID-19 Vaccine (7 - 2024-25 season) 07/14/2023   Medicare Annual Wellness Visit  03/10/2024   Flu Shot  04/21/2024   Pneumococcal Vaccine for age over 81  Completed   DEXA scan (bone density measurement)  Completed   Hepatitis C Screening  Completed   Zoster (Shingles) Vaccine  Completed   Hepatitis B Vaccine  Aged Out   HPV Vaccine  Aged Out   Meningitis B Vaccine  Aged Out    Advanced directives: (Copy Requested) Please bring a copy of your health care power of attorney and living will to the office to be added to your chart at your convenience. You can mail to [facility] [address] [place], [place] [address] or email to [email or web address] Advance Care Planning is important because it:  [x]  Makes sure you receive the medical care that is consistent with your values,  goals, and preferences  [x]  It provides guidance to your family and loved ones and reduces their decisional burden about whether or not they are making the right decisions based on your wishes.  Follow the link provided in your after visit summary or read over the paperwork we have mailed to you to help you started getting your Advance Directives in place. If you need assistance in completing these, please reach out to us  so that we can help you!  See attachments for Preventive Care and Fall Prevention Tips.

## 2024-03-16 NOTE — Progress Notes (Addendum)
 Subjective:   [name] is a 78 y.o. who presents for a Medicare Wellness preventive visit.  As a reminder, Annual Wellness Visits don't include a physical exam, and some assessments may be limited, especially if this visit is performed virtually. We may recommend an in-person follow-up visit with your provider if needed.  Visit Complete: Virtual I connected with  [name] on 03/16/24 by a audio enabled telemedicine application and verified that I am speaking with the correct person using two identifiers.  Patient Location: Home  Provider Location: Office/Clinic  I discussed the limitations of evaluation and management by telemedicine. The patient expressed understanding and agreed to proceed.  Vital Signs: Because this visit was a virtual/telehealth visit, some criteria may be missing or patient reported. Any vitals not documented were not able to be obtained and vitals that have been documented are patient reported.  VideoDeclined- This patient declined Librarian, academic. Therefore the visit was completed with audio only.  Persons Participating in Visit: Patient.  AWV Questionnaire: Yes: Patient Medicare AWV questionnaire was completed by the patient on 03/13/2024; I have confirmed that all information answered by patient is correct and no changes since this date.  Cardiac Risk Factors include: Other (see comment);advanced age (>60men, >75 women), Risk factor comments: Coronary atherosclerosis     Objective:    Today's Vitals   03/16/24 1036  Weight: 90 lb (40.8 kg)  Height: 5' 5 (1.651 m)   Body mass index is 14.98 kg/m.     03/16/2024   10:48 AM 03/11/2023    9:05 AM 07/21/2019    9:46 AM 11/24/2015    1:44 PM  Advanced Directives  Does Patient Have a Medical Advance Directive? Yes Yes Yes Yes   Type of Estate agent of [place];Living will Healthcare Power of [place];Living will Healthcare Power of  [place];Living will   Copy of Healthcare Power of Attorney in Chart? No - copy requested No - copy requested No - copy requested      Data saved with a previous flowsheet row definition    Current Medications (verified) Outpatient Encounter Medications as of 03/16/2024  Medication Sig   Ascorbic Acid (VITAMIN C) 500 MG CAPS Take 1 capsule by mouth daily   cholecalciferol (VITAMIN D ) 1000 UNITS tablet Take 2,000 Units by mouth daily.   Multiple Vitamins-Minerals (MULTIVITAMIN ADULTS 50+) TABS    No facility-administered encounter medications on file as of 03/16/2024.    Allergies (verified) Fosamax [alendronate sodium] and Zanaflex  [tizanidine ]   History: Past Medical History:  Diagnosis Date   Adenomatous colon polyp    Atrophic vaginitis    Benign neoplasm of colon 01/18/2008   DIVERTICULOSIS, MILD 10/26/2007   colonoscopy 10/26/07   H pylori ulcer    HEMORRHOIDS, INTERNAL 01/18/2008   OSTEOPENIA 10/2018   T score 2.1 FRAX 14% / 6%   Skin disorder    NOT PARAPSORIASIS--ETIOLOGY UNCLEAR   Ulcer 01/31/06   STOMACH ULCER   Past Surgical History:  Procedure Laterality Date   APPENDECTOMY  1963   COLONOSCOPY W/ BIOPSIES     multiple   ESOPHAGOGASTRODUODENOSCOPY  03/30/2006   GANGLION CYST EXCISION  1972   Right   Gastric Ulcer and GI Bleed (hospitalization)  5/07   LYMPH NODE BIOPSY  2001   Benign from the groin-Inflammatory changes due to her skin condition.   s/p eradication of H. Pylori     confirmed by urea breath teast   SKIN CANCER EXCISION  removed from the forehead   TONSILLECTOMY  1955   TUBAL LIGATION     Family History  Problem Relation Age of Onset   Breast cancer Mother        Age 71's   Heart disease Mother    Heart disease Father    Breast cancer Maternal Aunt        Age 8's   Heart disease Maternal Grandmother    Colon cancer Maternal Grandfather    Breast cancer Cousin        maternal   Colon cancer Cousin    Colon cancer Maternal Uncle     Breast cancer Cousin    Breast cancer Cousin    Social History   Socioeconomic History   Marital status: Widowed    Spouse name: Not on file   Number of children: Not on file   Years of education: Not on file   Highest education level: Bachelor's degree (e.g., BA, AB, BS)  Occupational History   Occupation: Accountant  Tobacco Use   Smoking status: Never   Smokeless tobacco: Never  Vaping Use   Vaping status: Never Used  Substance and Sexual Activity   Alcohol use: Yes    Alcohol/week: 7.0 standard drinks of alcohol    Types: 7 Glasses of wine per week   Drug use: No   Sexual activity: Not Currently    Birth control/protection: Surgical, Post-menopausal    Comment: BTL-1st intercourse 78 yo-Fewer than 5 partners  Other Topics Concern   Not on file  Social History Narrative   Married without children. She is a Systems analyst. Working part-time. 3 coffees daily.   Regular exercise-yes   01/09/2015      Lives in a retirement home/ [place]-2025   Social Drivers of Health   Financial Resource Strain: Low Risk  (03/13/2024)   Overall Financial Resource Strain (CARDIA)    Difficulty of Paying Living Expenses: Not hard at all  Food Insecurity: No Food Insecurity (03/13/2024)   Hunger Vital Sign    Worried About Running Out of Food in the Last Year: Never true    Ran Out of Food in the Last Year: Never true  Transportation Needs: No Transportation Needs (03/13/2024)   PRAPARE - Administrator, Civil Service (Medical): No    Lack of Transportation (Non-Medical): No  Physical Activity: Sufficiently Active (03/13/2024)   Exercise Vital Sign    Days of Exercise per Week: 7 days    Minutes of Exercise per Session: 40 min  Stress: No Stress Concern Present (03/13/2024)   [employer] of Occupational Health - Occupational Stress Questionnaire    Feeling of Stress: Only a little  Social Connections: Moderately Isolated (03/13/2024)   Social Connection  and Isolation Panel    Frequency of Communication with Friends and Family: Once a week    Frequency of Social Gatherings with Friends and Family: Once a week    Attends Religious Services: More than 4 times per year    Active Member of [employer] or Organizations: Yes    Attends Banker Meetings: 1 to 4 times per year    Marital Status: Widowed    Tobacco Counseling Counseling given: Not Answered    Clinical Intake:  Pre-visit preparation completed: Yes  Pain : No/denies pain     BMI - recorded: 14.98 Nutritional Status: BMI <19  Underweight Nutritional Risks: None Diabetes: No  No results found for: HGBA1C   How often do you need  to have someone help you when you read instructions, pamphlets, or other written materials from your doctor or pharmacy?: 1 - Never  Interpreter Needed?: No  Information entered by :: Trameka Dorough, RMA   Activities of Daily Living     03/16/2024   10:37 AM  In your present state of health, do you have any difficulty performing the following activities:  Hearing? 0  Vision? 0  Difficulty concentrating or making decisions? 0  Walking or climbing stairs? 0  Dressing or bathing? 0  Doing errands, shopping? 0  Preparing Food and eating ? N  Using the Toilet? N  In the past six months, have you accidently leaked urine? N  Do you have problems with loss of bowel control? N  Managing your Medications? N  Managing your Finances? N  Housekeeping or managing your Housekeeping? N    Patient Care Team: Plotnikov, Karlynn GAILS, MD as PCP - General Fontaine, Evalene SQUIBB, MD (Inactive) as Consulting Physician (Gynecology) Avram Lupita BRAVO, MD as Consulting Physician (Gastroenterology) Cary Doffing, MD as Consulting Physician (Dermatology) Hunsucker, Donnice SAUNDERS, MD as Consulting Physician (Pulmonary Disease)  I have updated your Care Teams any recent Medical Services you may have received from other providers in the past year.      Assessment:   This is a routine wellness examination for [facility].  Hearing/Vision screen Hearing Screening - Comments:: Denies hearing difficulties   Vision Screening - Comments:: Wears eyeglasses for reading/ Dr. Waylan   Goals Addressed             This Visit's Progress    Patient Stated       Maintain her health/2025       Depression Screen     03/16/2024   10:52 AM 11/16/2023    8:44 AM 08/24/2023    9:43 AM 05/12/2023    9:05 AM 03/11/2023    9:03 AM 01/12/2023    9:28 AM 09/18/2022    8:47 AM  PHQ 2/9 Scores  PHQ - 2 Score 1 0 0 0 0 2 1  PHQ- 9 Score 1    0 6     Fall Risk     03/16/2024   10:49 AM 11/16/2023    8:44 AM 08/24/2023    9:43 AM 05/12/2023    9:05 AM 03/11/2023    9:05 AM  Fall Risk   Falls in the past year? 0 0 0 0 0  Number falls in past yr: 0 0 0 0 0  Injury with Fall? 0 0 0 0 0  Risk for fall due to :  No Fall Risks No Fall Risks No Fall Risks No Fall Risks  Follow up Falls evaluation completed;Falls prevention discussed Falls evaluation completed Falls evaluation completed Falls evaluation completed Falls prevention discussed    MEDICARE RISK AT HOME:  Medicare Risk at Home Any stairs in or around the home?: No If so, are there any without handrails?: No Home free of loose throw rugs in walkways, pet beds, electrical cords, etc?: Yes Adequate lighting in your home to reduce risk of falls?: Yes Life alert?: Yes (has one but does not wear it) Use of a cane, walker or w/c?: No Grab bars in the bathroom?: Yes Shower chair or bench in shower?: Yes Elevated toilet seat or a handicapped toilet?: Yes  TIMED UP AND GO:  Was the test performed?  No  Cognitive Function: Declined/Normal: No cognitive concerns noted by patient or family. Patient alert, oriented, able to  answer questions appropriately and recall recent events. No signs of memory loss or confusion.        03/11/2023    9:06 AM  6CIT Screen  What Year? 0 points  What month? 0  points  What time? 0 points  Count back from 20 0 points  Months in reverse 0 points  Repeat phrase 0 points  Total Score 0 points    Immunizations Immunization History  Administered Date(s) Administered   Fluad Quad(high Dose 65+) 06/06/2019, 06/20/2020, 06/23/2021, 07/04/2022, 06/29/2023   Influenza Split 07/16/2011, 07/05/2012   Influenza, High Dose Seasonal PF 06/16/2016, 06/29/2017, 06/27/2018   Influenza,inj,Quad PF,6+ Mos 06/20/2013, 06/21/2014, 06/25/2015   PFIZER(Purple Top)SARS-COV-2 Vaccination 10/16/2019, 11/06/2019, 07/04/2020, 06/09/2021   Pfizer Covid-19 Vaccine Bivalent Booster 85yrs & up 05/19/2023   Pfizer(Comirnaty)Fall Seasonal Vaccine 12 years and older 06/22/2022   Pneumococcal Conjugate-13 08/14/2013   Pneumococcal Polysaccharide-23 05/07/2011   Tdap 05/07/2011   Zoster Recombinant(Shingrix ) 10/24/2021, 02/05/2022   Zoster, Live 02/16/2008    Screening Tests Health Maintenance  Topic Date Due   Cervical Cancer Screening (Pap smear)  08/04/2015   DTaP/Tdap/Td (2 - Td or Tdap) 05/06/2021   COVID-19 Vaccine (7 - 2024-25 season) 07/14/2023   Medicare Annual Wellness (AWV)  03/10/2024   INFLUENZA VACCINE  04/21/2024   Pneumococcal Vaccine: 50+ Years  Completed   DEXA SCAN  Completed   Hepatitis C Screening  Completed   Zoster Vaccines- Shingrix   Completed   Hepatitis B Vaccines  Aged Out   HPV VACCINES  Aged Out   Meningococcal B Vaccine  Aged Out   Colonoscopy  Discontinued    Health Maintenance  Health Maintenance Due  Topic Date Due   Cervical Cancer Screening (Pap smear)  08/04/2015   DTaP/Tdap/Td (2 - Td or Tdap) 05/06/2021   COVID-19 Vaccine (7 - 2024-25 season) 07/14/2023   Medicare Annual Wellness (AWV)  03/10/2024   Health Maintenance Items Addressed: See Nurse Notes at the end of this note  Additional Screening:  Vision Screening: Recommended annual ophthalmology exams for early detection of glaucoma and other disorders of the  eye. Would you like a referral to an eye doctor? No    Dental Screening: Recommended annual dental exams for proper oral hygiene  Community Resource Referral / Chronic Care Management: CRR required this visit?  No   CCM required this visit?  No   Plan:    I have personally reviewed and noted the following in the patient's chart:   Medical and social history Use of alcohol, tobacco or illicit drugs  Current medications and supplements including opioid prescriptions. Patient is not currently taking opioid prescriptions. Functional ability and status Nutritional status Physical activity Advanced directives List of other physicians Hospitalizations, surgeries, and ER visits in previous 12 months Vitals Screenings to include cognitive, depression, and falls Referrals and appointments  In addition, I have reviewed and discussed with patient certain preventive protocols, quality metrics, and best practice recommendations. A written personalized care plan for preventive services as well as general preventive health recommendations were provided to patient.   Ian Cavey L Dmonte Maher, CMA   03/16/2024   After Visit Summary: (MyChart) Due to this being a telephonic visit, the after visit summary with patients personalized plan was offered to patient via MyChart   Notes: Patient is due for a Tdap.  She declines any other pap smears-  Per chart- She is comfortable with discontinuing screening based on age criteria and no significantly abnormal Pap smear history. Patient is up  to date on all other health maintenance with no concerns to address today.  Medical screening examination/treatment/procedure(s) were performed by non-physician practitioner and as supervising physician I was immediately available for consultation/collaboration.  I agree with above. Karlynn Noel, MD

## 2024-05-11 ENCOUNTER — Telehealth: Payer: Self-pay | Admitting: Internal Medicine

## 2024-05-11 NOTE — Telephone Encounter (Signed)
 Spoke with patient and advise her she can fast for labs. She understood with no further questions

## 2024-05-11 NOTE — Telephone Encounter (Signed)
 Copied from CRM [id number]. Topic: General - Other >> May 11, 2024  1:58 PM Jasmin G wrote: Reason for CRM: Pt would like to know if she needs to fast for upcoming appt on Aug 26th at 8:30 a.m. Please call her at [phone] let her know as I could not find info on this. It's okay to leave a message.

## 2024-05-13 DIAGNOSIS — M940 Chondrocostal junction syndrome [Tietze]: Secondary | ICD-10-CM | POA: Diagnosis not present

## 2024-05-13 DIAGNOSIS — R079 Chest pain, unspecified: Secondary | ICD-10-CM | POA: Diagnosis not present

## 2024-05-13 DIAGNOSIS — J90 Pleural effusion, not elsewhere classified: Secondary | ICD-10-CM | POA: Diagnosis not present

## 2024-05-13 DIAGNOSIS — J159 Unspecified bacterial pneumonia: Secondary | ICD-10-CM | POA: Diagnosis not present

## 2024-05-16 ENCOUNTER — Ambulatory Visit: Payer: PPO | Admitting: Internal Medicine

## 2024-05-16 ENCOUNTER — Encounter: Payer: Self-pay | Admitting: Internal Medicine

## 2024-05-16 VITALS — BP 117/75 | HR 90 | Temp 97.8°F | Ht 65.0 in | Wt 91.0 lb

## 2024-05-16 DIAGNOSIS — R21 Rash and other nonspecific skin eruption: Secondary | ICD-10-CM | POA: Diagnosis not present

## 2024-05-16 DIAGNOSIS — R748 Abnormal levels of other serum enzymes: Secondary | ICD-10-CM | POA: Diagnosis not present

## 2024-05-16 DIAGNOSIS — M8580 Other specified disorders of bone density and structure, unspecified site: Secondary | ICD-10-CM | POA: Diagnosis not present

## 2024-05-16 DIAGNOSIS — J189 Pneumonia, unspecified organism: Secondary | ICD-10-CM | POA: Diagnosis not present

## 2024-05-16 NOTE — Assessment & Plan Note (Signed)
 Monitor LFTs

## 2024-05-16 NOTE — Progress Notes (Signed)
 Subjective:  Patient ID: [name], female    DOB: [birth year]  Age: 78 y.o. MRN: [id number]  CC: Medical Management of Chronic Issues (6 MNTH F/U)   HPI TYMIKA GRILLI presents for R CAP (CXR at [facility] 05/13/24) On Prednisone, Z pack, Augmentin  Outpatient Medications Prior to Visit  Medication Sig Dispense Refill   amoxicillin-clavulanate (AUGMENTIN) 875-125 MG tablet Take 1 tablet by mouth 2 (two) times daily.     cholecalciferol (VITAMIN D ) 1000 UNITS tablet Take 2,000 Units by mouth daily.     Multiple Vitamins-Minerals (MULTIVITAMIN ADULTS 50+) TABS      methocarbamol (ROBAXIN) 500 MG tablet Take by mouth.     Ascorbic Acid (VITAMIN C) 500 MG CAPS Take 1 capsule by mouth daily     No facility-administered medications prior to visit.    ROS: Review of Systems  Constitutional:  Negative for activity change, appetite change, chills, fatigue and unexpected weight change.  HENT:  Negative for congestion, mouth sores and sinus pressure.   Eyes:  Negative for visual disturbance.  Respiratory:  Positive for cough. Negative for chest tightness.   Gastrointestinal:  Negative for abdominal pain and nausea.  Genitourinary:  Negative for difficulty urinating, frequency and vaginal pain.  Musculoskeletal:  Positive for back pain. Negative for gait problem.  Skin:  Positive for rash. Negative for pallor.  Neurological:  Negative for dizziness, tremors, weakness, numbness and headaches.  Psychiatric/Behavioral:  Negative for confusion and sleep disturbance.     Objective:  BP 117/75   Pulse 90   Temp 97.8 F (36.6 C) (Oral)   Ht 5' 5 (1.651 m)   Wt 91 lb (41.3 kg)   SpO2 96%   BMI 15.14 kg/m   BP Readings from Last 3 Encounters:  05/16/24 117/75  11/16/23 110/70  08/24/23 118/68    Wt Readings from Last 3 Encounters:  05/16/24 91 lb (41.3 kg)  03/16/24 90 lb (40.8 kg)  11/16/23 90 lb (40.8 kg)    Physical Exam Constitutional:      General: She is not in acute  distress.    Appearance: Normal appearance. [name].  HENT:     Head: Normocephalic.     Right Ear: External ear normal.     Left Ear: External ear normal.     Nose: Nose normal.  Eyes:     General:        Right eye: No discharge.        Left eye: No discharge.     Conjunctiva/sclera: Conjunctivae normal.     Pupils: Pupils are equal, round, and reactive to light.  Neck:     Thyroid : No thyromegaly.     Vascular: No JVD.     Trachea: No tracheal deviation.  Cardiovascular:     Rate and Rhythm: Normal rate and regular rhythm.     Heart sounds: Normal heart sounds.  Pulmonary:     Effort: No respiratory distress.     Breath sounds: No stridor. No wheezing.  Abdominal:     General: Bowel sounds are normal. There is no distension.     Palpations: Abdomen is soft. There is no mass.     Tenderness: There is no abdominal tenderness. There is no guarding or rebound.  Musculoskeletal:        General: No tenderness.     Cervical back: Normal range of motion and neck supple. No rigidity.  Lymphadenopathy:     Cervical: No cervical adenopathy.  Skin:  Findings: No erythema or rash.  Neurological:     Mental Status: She is oriented to person, place, and time.     Cranial Nerves: No cranial nerve deficit.     Motor: No abnormal muscle tone.     Coordination: Coordination normal.     Deep Tendon Reflexes: Reflexes normal.  Psychiatric:        Behavior: Behavior normal.        Thought Content: Thought content normal.        Judgment: Judgment normal.     Lab Results  Component Value Date   WBC 6.3 08/24/2023   HGB 14.1 08/24/2023   HCT 42.7 08/24/2023   PLT 334.0 08/24/2023   GLUCOSE 104 (H) 08/24/2023   CHOL 185 01/07/2022   TRIG 59.0 01/07/2022   HDL 89.60 01/07/2022   LDLDIRECT 107.6 08/15/2012   LDLCALC 84 01/07/2022   ALT 23 08/24/2023   AST 49 (H) 08/24/2023   NA 138 08/24/2023   K 4.1 08/24/2023   CL 101 08/24/2023   CREATININE 0.64 08/24/2023    BUN 16 08/24/2023   CO2 33 (H) 08/24/2023   TSH 1.62 08/24/2023    MM 3D SCREENING MAMMOGRAM BILATERAL BREAST Result Date: 12/23/2023 CLINICAL DATA:  Screening. EXAM: DIGITAL SCREENING BILATERAL MAMMOGRAM WITH TOMOSYNTHESIS AND CAD TECHNIQUE: Bilateral screening digital craniocaudal and mediolateral oblique mammograms were obtained. Bilateral screening digital breast tomosynthesis was performed. The images were evaluated with computer-aided detection. COMPARISON:  Previous exam(s). ACR Breast Density Category d: The breasts are extremely dense, which lowers the sensitivity of mammography. FINDINGS: There are no findings suspicious for malignancy. IMPRESSION: No mammographic evidence of malignancy. A result letter of this screening mammogram will be mailed directly to the patient. RECOMMENDATION: Screening mammogram in one year. (Code:SM-B-01Y) BI-RADS CATEGORY  1: Negative. Electronically Signed   By: Inocente Ast M.D.   On: 12/23/2023 13:33    Assessment & Plan:   Problem List Items Addressed This Visit     CAP (community acquired pneumonia) - Primary   R CAP (CXR at [facility] 05/13/24) On Prednisone, Z pack, Augmentin Pleuritic CP Take Align one a day for 2 weeks CXR in 6 wks      Relevant Medications   amoxicillin-clavulanate (AUGMENTIN) 875-125 MG tablet   Other Relevant Orders   DG Chest 2 View   Elevated liver enzymes   Monitor LFTs      Osteopenia   Chronic Vit D, core exercises Other options discussed      Rash   D/c Augmentin if rash is worse Benadryl prn         No orders of the defined types were placed in this encounter.     Follow-up: Return in about 6 weeks (around 06/27/2024) for a follow-up visit.  Marolyn Noel, MD

## 2024-05-16 NOTE — Assessment & Plan Note (Signed)
 D/c Augmentin if rash is worse Benadryl prn

## 2024-05-16 NOTE — Assessment & Plan Note (Addendum)
 R CAP (CXR at [facility] 05/13/24) On Prednisone, Z pack, Augmentin Pleuritic CP Take Align one a day for 2 weeks CXR in 6 wks

## 2024-05-16 NOTE — Patient Instructions (Signed)
 Take Align one a day for 2 weeks

## 2024-05-16 NOTE — Assessment & Plan Note (Signed)
 Chronic Vit D, core exercises Other options discussed

## 2024-05-27 ENCOUNTER — Emergency Department (HOSPITAL_BASED_OUTPATIENT_CLINIC_OR_DEPARTMENT_OTHER)

## 2024-05-27 ENCOUNTER — Telehealth: Payer: Self-pay | Admitting: Pulmonary Disease

## 2024-05-27 ENCOUNTER — Emergency Department (HOSPITAL_BASED_OUTPATIENT_CLINIC_OR_DEPARTMENT_OTHER)
Admission: EM | Admit: 2024-05-27 | Discharge: 2024-05-27 | Disposition: A | Discharge status: Home / Self Care | Attending: Emergency Medicine | Admitting: Emergency Medicine

## 2024-05-27 DIAGNOSIS — R079 Chest pain, unspecified: Secondary | ICD-10-CM | POA: Diagnosis not present

## 2024-05-27 DIAGNOSIS — J90 Pleural effusion, not elsewhere classified: Secondary | ICD-10-CM | POA: Insufficient documentation

## 2024-05-27 DIAGNOSIS — R109 Unspecified abdominal pain: Secondary | ICD-10-CM | POA: Diagnosis not present

## 2024-05-27 DIAGNOSIS — R0602 Shortness of breath: Secondary | ICD-10-CM | POA: Diagnosis not present

## 2024-05-27 DIAGNOSIS — J189 Pneumonia, unspecified organism: Secondary | ICD-10-CM | POA: Diagnosis not present

## 2024-05-27 DIAGNOSIS — R739 Hyperglycemia, unspecified: Secondary | ICD-10-CM | POA: Diagnosis not present

## 2024-05-27 DIAGNOSIS — J9811 Atelectasis: Secondary | ICD-10-CM

## 2024-05-27 DIAGNOSIS — J9809 Other diseases of bronchus, not elsewhere classified: Secondary | ICD-10-CM | POA: Diagnosis not present

## 2024-05-27 DIAGNOSIS — M546 Pain in thoracic spine: Secondary | ICD-10-CM | POA: Diagnosis present

## 2024-05-27 DIAGNOSIS — R918 Other nonspecific abnormal finding of lung field: Secondary | ICD-10-CM | POA: Diagnosis not present

## 2024-05-27 DIAGNOSIS — R9431 Abnormal electrocardiogram [ECG] [EKG]: Secondary | ICD-10-CM | POA: Diagnosis not present

## 2024-05-27 DIAGNOSIS — R0600 Dyspnea, unspecified: Secondary | ICD-10-CM | POA: Diagnosis not present

## 2024-05-27 LAB — BASIC METABOLIC PANEL WITH GFR
Anion gap: 14 (ref 5–15)
BUN: 15 mg/dL (ref 8–23)
CO2: 25 mmol/L (ref 22–32)
Calcium: 10.2 mg/dL (ref 8.9–10.3)
Chloride: 101 mmol/L (ref 98–111)
Creatinine, Ser: 0.73 mg/dL (ref 0.44–1.00)
GFR, Estimated: >60 mL/min
Glucose, Bld: 225 mg/dL — ABNORMAL HIGH (ref 70–99)
Potassium: 4.4 mmol/L (ref 3.5–5.1)
Sodium: 139 mmol/L (ref 135–145)

## 2024-05-27 LAB — CBC
HCT: 43.8 % (ref 36.0–46.0)
Hemoglobin: 14.7 g/dL (ref 12.0–15.0)
MCH: 30.8 pg (ref 26.0–34.0)
MCHC: 33.6 g/dL (ref 30.0–36.0)
MCV: 91.8 fL (ref 80.0–100.0)
Platelets: 357 10*3/uL (ref 150–400)
RBC: 4.77 MIL/uL (ref 3.87–5.11)
RDW: 11.8 % (ref 11.5–15.5)
WBC: 8 10*3/uL (ref 4.0–10.5)
nRBC: 0 % (ref 0.0–0.2)

## 2024-05-27 LAB — TROPONIN T, HIGH SENSITIVITY
Troponin T High Sensitivity: <15 ng/L (ref 0–19)
Troponin T High Sensitivity: <15 ng/L (ref 0–19)

## 2024-05-27 LAB — PRO BRAIN NATRIURETIC PEPTIDE: Pro Brain Natriuretic Peptide: 89.6 pg/mL (ref <300.0)

## 2024-05-27 MED ORDER — IOHEXOL 350 MG/ML SOLN
75.0000 mL | Freq: Once | INTRAVENOUS | Status: AC | PRN
  Administered 2024-05-27: 75 mL via INTRAVENOUS

## 2024-05-27 NOTE — Discharge Instructions (Addendum)
 You were seen in the ER today for evaluation of your symptoms. Your Chest CT was abnormal. Dr. Kassie with Pulmonology would like you to come to the office at 1030 on Thursday, September 11th. You will get a chest x-ray at that time and you will have you appointment later. Dr. Kassie will be working you in that day, so you may have to wait. For your flutter valve, make sure you are using it 10 times for one session, and having 5 sessions a day. You can use the incentive spirometer throughout the day while awake as well. I have included additional information into the discharge paperwork for you to review. If you have any concerns, new or worsening symptoms, please return to the ER for re-evaluation.   **please make sure you follow up with your PCP on your elevated blood sugar.   Contact a health care provider if: You feel pain or discomfort after using any chest physical therapy techniques. You are not able to cough up any mucus after chest physical therapy sessions. You have a fever. You make whistling sounds when you breathe (wheeze). Get help right away if: You have trouble breathing. You cough up blood. You have severe pain after using any chest physical therapy techniques. These symptoms may be an emergency. Get help right away. Call 911. Do not wait to see if the symptoms will go away. Do not drive yourself to the hospital.

## 2024-05-27 NOTE — ED Provider Notes (Signed)
 [place] EMERGENCY DEPARTMENT AT [facility] Provider Note   CSN: [id number] Arrival date & time: 05/27/24  1307     Patient presents with: Chest Pain   [name] is a 78 y.o. female.  {Add pertinent medical, surgical, social history, OB history to HPI:32947}  Chest Pain      Prior to Admission medications   Medication Sig Start Date End Date Taking? Authorizing Provider  amoxicillin-clavulanate (AUGMENTIN) 875-125 MG tablet Take 1 tablet by mouth 2 (two) times daily. 05/13/24   [provider]  cholecalciferol (VITAMIN D ) 1000 UNITS tablet Take 2,000 Units by mouth daily.    [provider]  Multiple Vitamins-Minerals (MULTIVITAMIN ADULTS 50+) TABS     [provider]    Allergies: Fosamax [alendronate sodium] and Zanaflex  [tizanidine ]    Review of Systems  Cardiovascular:  Positive for chest pain.    Updated Vital Signs BP 121/81 (BP Location: Right Arm)   Pulse 78   Temp 97.9 F (36.6 C)   Resp 16   SpO2 97%   Physical Exam  (all labs ordered are listed, but only abnormal results are displayed) Labs Reviewed  BASIC METABOLIC PANEL WITH GFR - Abnormal; Notable for the following components:      Result Value   Glucose, Bld 225 (*)    All other components within normal limits  CBC  PRO BRAIN NATRIURETIC PEPTIDE  TROPONIN T, HIGH SENSITIVITY  TROPONIN T, HIGH SENSITIVITY    EKG: EKG Interpretation Date/Time:  Saturday May 27 2024 13:13:54 EDT Ventricular Rate:  99 PR Interval:  122 QRS Duration:  84 QT Interval:  338 QTC Calculation: 433 R Axis:   158  Text Interpretation: Normal sinus rhythm Right axis deviation Right ventricular hypertrophy Abnormal ECG When compared with ECG of 24-Nov-2015 13:38, PREVIOUS ECG IS PRESENT No significant change since last tracing Confirmed by Roselyn Dunnings [phone]) on 05/27/2024 2:43:09 PM  Radiology: CT Angio Chest PE W and/or Wo Contrast Result Date:  05/27/2024 CLINICAL DATA:  Chest pain, short of breath, recent pneumonia, abnormal chest x-ray EXAM: CT ANGIOGRAPHY CHEST CT ABDOMEN AND PELVIS WITH CONTRAST TECHNIQUE: Multidetector CT imaging of the chest was performed using the standard protocol during bolus administration of intravenous contrast. Multiplanar CT image reconstructions and MIPs were obtained to evaluate the vascular anatomy. Multidetector CT imaging of the abdomen and pelvis was performed using the standard protocol during bolus administration of intravenous contrast. RADIATION DOSE REDUCTION: This exam was performed according to the departmental dose-optimization program which includes automated exposure control, adjustment of the mA and/or kV according to patient size and/or use of iterative reconstruction technique. CONTRAST:  75mL OMNIPAQUE  IOHEXOL  350 MG/ML SOLN COMPARISON:  06/24/2020, 05/27/2024 FINDINGS: CTA CHEST FINDINGS Cardiovascular: This is a technically adequate evaluation of the pulmonary vasculature. No filling defects or pulmonary emboli. The heart is unremarkable without pericardial effusion. No evidence of thoracic aortic aneurysm or dissection. Mediastinum/Nodes: No enlarged mediastinal, hilar, or axillary lymph nodes. Thyroid  gland, trachea, and esophagus demonstrate no significant findings. Lungs/Pleura: There is complete occlusion of the bronchus intermedius as well as the right middle and right lower lobe bronchi, with complete collapse of the right middle and right lower lobes. There is a possible gas fluid level within the obstructed bronchus suggesting aspiration or mucoid impaction, though central obstructing mass cannot be entirely excluded. Follow-up after aggressive pulmonary toilet recommended. There is a small free-flowing right pleural effusion. No pneumothorax. There are scattered ground-glass opacities throughout the right upper lobe which  may be inflammatory or infectious. The left chest is clear.  Musculoskeletal: No acute or destructive bony abnormalities. Reconstructed images demonstrate no additional findings. Review of the MIP images confirms the above findings. CT ABDOMEN and PELVIS FINDINGS Hepatobiliary: No focal liver abnormality is seen. No gallstones, gallbladder wall thickening, or biliary dilatation. Pancreas: Unremarkable. No pancreatic ductal dilatation or surrounding inflammatory changes. Spleen: Normal in size without focal abnormality. Adrenals/Urinary Tract: Adrenal glands are unremarkable. Kidneys are normal, without renal calculi, focal lesion, or hydronephrosis. Bladder is decompressed, limiting its evaluation. Stomach/Bowel: No bowel obstruction or ileus. No bowel wall thickening or inflammatory change. Vascular/Lymphatic: No significant vascular findings are present. No enlarged abdominal or pelvic lymph nodes. Reproductive: Uterus is atrophic.  No adnexal masses. Other: No free fluid or free intraperitoneal gas. No abdominal wall hernia. Musculoskeletal: No acute or destructive bony abnormalities. Right sided L5 spondylolysis without spondylolisthesis. Reconstructed images demonstrate no additional findings. Review of the MIP images confirms the above findings. IMPRESSION: Chest: 1. No evidence of pulmonary embolus. 2. Complete occlusion of the bronchus intermedius as well as the right middle and right lower lobe bronchi. This may reflect aspiration or mucoid impaction, though central obstructing lesion cannot be excluded. Follow-up imaging after aggressive pulmonary toilet may be useful. 3. Scattered ground-glass opacities within the aerated right upper lobe, which may reflect inflammatory or infectious etiology. 4. Small free-flowing right pleural effusion. Abdomen/pelvis: 1. No acute intra-abdominal or intrapelvic process. Electronically Signed   By: Ozell Daring M.D.   On: 05/27/2024 15:13   CT ABDOMEN PELVIS W CONTRAST Result Date: 05/27/2024 CLINICAL DATA:  Chest pain, short  of breath, recent pneumonia, abnormal chest x-ray EXAM: CT ANGIOGRAPHY CHEST CT ABDOMEN AND PELVIS WITH CONTRAST TECHNIQUE: Multidetector CT imaging of the chest was performed using the standard protocol during bolus administration of intravenous contrast. Multiplanar CT image reconstructions and MIPs were obtained to evaluate the vascular anatomy. Multidetector CT imaging of the abdomen and pelvis was performed using the standard protocol during bolus administration of intravenous contrast. RADIATION DOSE REDUCTION: This exam was performed according to the departmental dose-optimization program which includes automated exposure control, adjustment of the mA and/or kV according to patient size and/or use of iterative reconstruction technique. CONTRAST:  75mL OMNIPAQUE  IOHEXOL  350 MG/ML SOLN COMPARISON:  06/24/2020, 05/27/2024 FINDINGS: CTA CHEST FINDINGS Cardiovascular: This is a technically adequate evaluation of the pulmonary vasculature. No filling defects or pulmonary emboli. The heart is unremarkable without pericardial effusion. No evidence of thoracic aortic aneurysm or dissection. Mediastinum/Nodes: No enlarged mediastinal, hilar, or axillary lymph nodes. Thyroid  gland, trachea, and esophagus demonstrate no significant findings. Lungs/Pleura: There is complete occlusion of the bronchus intermedius as well as the right middle and right lower lobe bronchi, with complete collapse of the right middle and right lower lobes. There is a possible gas fluid level within the obstructed bronchus suggesting aspiration or mucoid impaction, though central obstructing mass cannot be entirely excluded. Follow-up after aggressive pulmonary toilet recommended. There is a small free-flowing right pleural effusion. No pneumothorax. There are scattered ground-glass opacities throughout the right upper lobe which may be inflammatory or infectious. The left chest is clear. Musculoskeletal: No acute or destructive bony  abnormalities. Reconstructed images demonstrate no additional findings. Review of the MIP images confirms the above findings. CT ABDOMEN and PELVIS FINDINGS Hepatobiliary: No focal liver abnormality is seen. No gallstones, gallbladder wall thickening, or biliary dilatation. Pancreas: Unremarkable. No pancreatic ductal dilatation or surrounding inflammatory changes. Spleen: Normal in size without focal abnormality. Adrenals/Urinary  Tract: Adrenal glands are unremarkable. Kidneys are normal, without renal calculi, focal lesion, or hydronephrosis. Bladder is decompressed, limiting its evaluation. Stomach/Bowel: No bowel obstruction or ileus. No bowel wall thickening or inflammatory change. Vascular/Lymphatic: No significant vascular findings are present. No enlarged abdominal or pelvic lymph nodes. Reproductive: Uterus is atrophic.  No adnexal masses. Other: No free fluid or free intraperitoneal gas. No abdominal wall hernia. Musculoskeletal: No acute or destructive bony abnormalities. Right sided L5 spondylolysis without spondylolisthesis. Reconstructed images demonstrate no additional findings. Review of the MIP images confirms the above findings. IMPRESSION: Chest: 1. No evidence of pulmonary embolus. 2. Complete occlusion of the bronchus intermedius as well as the right middle and right lower lobe bronchi. This may reflect aspiration or mucoid impaction, though central obstructing lesion cannot be excluded. Follow-up imaging after aggressive pulmonary toilet may be useful. 3. Scattered ground-glass opacities within the aerated right upper lobe, which may reflect inflammatory or infectious etiology. 4. Small free-flowing right pleural effusion. Abdomen/pelvis: 1. No acute intra-abdominal or intrapelvic process. Electronically Signed   By: Ozell Daring M.D.   On: 05/27/2024 15:13   DG Chest Port 1 View Result Date: 05/27/2024 CLINICAL DATA:  Chest pain and shortness of breath. Recent pneumonia. EXAM: PORTABLE  CHEST 1 VIEW COMPARISON:  04/02/2022.  CT chest 06/24/2020. FINDINGS: Trachea is deviated slightly to the right as is the heart, which is at the upper limits of normal in size to mildly enlarged. Right lower lobe collapse/consolidation. There may be right middle lobe involvement as well. Small partially loculated right pleural effusion. Interstitial prominence in the right upper lobe. Streaky scarring in the left lower lobe. No left pleural fluid. IMPRESSION: 1. Collapse/consolidation in the right lower lobe and possibly right middle lobe, with a small to moderate partially loculated right pleural effusion. Recommend CT chest with contrast in further evaluation, as findings are concerning for malignancy. 2. Interstitial prominence in the right upper lobe with rightward deviation of the trachea. This can be further assessed with chest CT recommended above. Electronically Signed   By: Newell Eke M.D.   On: 05/27/2024 14:06    {Document cardiac monitor, telemetry assessment procedure when appropriate:32947} Procedures   Medications Ordered in the ED  iohexol  (OMNIPAQUE ) 350 MG/ML injection 75 mL (75 mLs Intravenous Contrast Given 05/27/24 1449)    Clinical Course as of 05/27/24 1711  Sat May 27, 2024  1701 Dr Kassie from pulmonology consulted. Feels that she won't get a bronch until Monday.  [RP]    Clinical Course User Index [RP] Yolande Lamar BROCKS, MD   {Click here for ABCD2, HEART and other calculators REFRESH Note before signing:1}                              Medical Decision Making Amount and/or Complexity of Data Reviewed Labs: ordered. Radiology: ordered.  Risk Prescription drug management.   ***  {Document critical care time when appropriate  Document review of labs and clinical decision tools ie [id number], etc  Document your independent review of radiology images and any outside records  Document your discussion with family members, caretakers and with consultants   Document social determinants of health affecting pt's care  Document your decision making why or why not admission, treatments were needed:32947:::1}   Final diagnoses:  None    ED Discharge Orders     None

## 2024-05-27 NOTE — ED Triage Notes (Signed)
 C/o CP and exertional SHOB x 2 week. States was dx w/ PNA at [facility] and prescribed Antibiotics.  Was told today at [facility] that she had a pleural effusion.

## 2024-05-27 NOTE — ED Notes (Signed)
 RN reviewed discharge instructions with pt. Pt verbalized understanding and had no further questions. VSS upon discharge.

## 2024-05-27 NOTE — ED Notes (Signed)
 Troponin drawn, specimen to lab; sunquest down due to power failure

## 2024-05-27 NOTE — Telephone Encounter (Signed)
 [place] [facility] Telephone Encounter  [facility] contacted PCCM team regarding chest imaging concerning for occlusion of bronchus intermedius in hemodynamically stable patient. Will need pulmonary toilet. Patient prefers to be discharged home and managed outpatient.  Staff, please schedule patient for 10:30 AM CXR at [facility] location and book her in 11:15 AM slot. But I can see her after she gets CXR completed.

## 2024-05-27 NOTE — ED Notes (Signed)
 Instructed pt on incentive spirometer and flutter valve. Pt gave good effort.Both devices were completed x 10.

## 2024-05-29 NOTE — Telephone Encounter (Signed)
 Can you book

## 2024-06-01 ENCOUNTER — Encounter (HOSPITAL_BASED_OUTPATIENT_CLINIC_OR_DEPARTMENT_OTHER): Payer: Self-pay | Admitting: Pulmonary Disease

## 2024-06-01 ENCOUNTER — Ambulatory Visit (HOSPITAL_BASED_OUTPATIENT_CLINIC_OR_DEPARTMENT_OTHER): Admitting: Pulmonary Disease

## 2024-06-01 ENCOUNTER — Ambulatory Visit (HOSPITAL_BASED_OUTPATIENT_CLINIC_OR_DEPARTMENT_OTHER)

## 2024-06-01 ENCOUNTER — Other Ambulatory Visit (HOSPITAL_BASED_OUTPATIENT_CLINIC_OR_DEPARTMENT_OTHER): Payer: Self-pay

## 2024-06-01 ENCOUNTER — Encounter: Payer: Self-pay | Admitting: Pulmonary Disease

## 2024-06-01 VITALS — BP 112/71 | HR 84 | Ht 65.0 in | Wt 90.4 lb

## 2024-06-01 DIAGNOSIS — J9811 Atelectasis: Secondary | ICD-10-CM | POA: Diagnosis not present

## 2024-06-01 DIAGNOSIS — R918 Other nonspecific abnormal finding of lung field: Secondary | ICD-10-CM | POA: Diagnosis not present

## 2024-06-01 NOTE — Patient Instructions (Addendum)
 Please arrive at 12:30 PM at [facility] Scheduled for bronchoscopy at 1:30 PM with Dr. Kassie  Nothing to eat after midnight. No breakfast. Sips of water is OK

## 2024-06-01 NOTE — Progress Notes (Signed)
 Please schedule the following:  Provider performing procedure:Jane Kassie, MD Diagnosis: Atelectasis Which side if for nodule / mass? Right Procedure: Flexible bronchoscopy  Has patient been spoken to by Provider and given informed consent? Yes Anesthesia: General Do you need Fluro? No Duration of procedure: 30 min Date: 9/12 Alternate Date: None  Time: PM Location: [place] Does patient have OSA? No DM? No Or Latex allergy? No Medication Restriction/ Anticoagulate/Antiplatelet: None Pre-op Labs Ordered:determined by Anesthesia Imaging request: None  (If, SuperDimension CT Chest, please have STAT courier sent to ENDO)

## 2024-06-01 NOTE — Progress Notes (Signed)
 Subjective:   PATIENT ID: [name] GENDER: female DOB: 07-Feb-1946, MRN: [id number]  Chief Complaint  Patient presents with   Follow-up    Atelectasis    Reason for Visit: ED follow-up.   78 year old female never smoker with CAD who presents for ED follow-up. Previously seen by Dr. Annella and Dr. Darlean for aspiration of vitamin C tablet two years ago and havent really felt the same. Has a sensation of foreign object. Swallow test with mild barium retention transiently with normal clearance after swallowing thick barium. Mild esophageal dysmotility.   She was seen in the ED. She was using the inventive spirometer and flutter valve multiple times since then. CXR demonstrates persistent atelectasis.  I have personally reviewed patient's past medical/family/social history, allergies, current medications.  Past Medical History:  Diagnosis Date   Adenomatous colon polyp    Atrophic vaginitis    Benign neoplasm of colon 01/18/2008   DIVERTICULOSIS, MILD 10/26/2007   colonoscopy 10/26/07   H pylori ulcer    HEMORRHOIDS, INTERNAL 01/18/2008   OSTEOPENIA 10/2018   T score 2.1 FRAX 14% / 6%   Skin disorder    NOT PARAPSORIASIS--ETIOLOGY UNCLEAR   Ulcer 01/31/06   STOMACH ULCER     Family History  Problem Relation Age of Onset   Breast cancer Mother        Age 17's   Heart disease Mother    Heart disease Father    Breast cancer Maternal Aunt        Age 80's   Heart disease Maternal Grandmother    Colon cancer Maternal Grandfather    Breast cancer Cousin        maternal   Colon cancer Cousin    Colon cancer Maternal Uncle    Breast cancer Cousin    Breast cancer Cousin      Social History   Occupational History   Occupation: Airline pilot  Tobacco Use   Smoking status: Never   Smokeless tobacco: Never  Vaping Use   Vaping status: Never Used  Substance and Sexual Activity   Alcohol use: Yes    Alcohol/week: 7.0 standard drinks of alcohol    Types: 7 Glasses of  wine per week   Drug use: No   Sexual activity: Not Currently    Birth control/protection: Surgical, Post-menopausal    Comment: BTL-1st intercourse 78 yo-Fewer than 5 partners    Allergies  Allergen Reactions   Fosamax [Alendronate Sodium] Other (See Comments)    Abdominal pain   Zanaflex  [Tizanidine ]     Did not feel good     Outpatient Medications Prior to Visit  Medication Sig Dispense Refill   Multiple Vitamins-Minerals (MULTIVITAMIN ADULTS 50+) TABS      cholecalciferol (VITAMIN D ) 1000 UNITS tablet Take 2,000 Units by mouth daily. (Patient not taking: Reported on 06/01/2024)     amoxicillin-clavulanate (AUGMENTIN) 875-125 MG tablet Take 1 tablet by mouth 2 (two) times daily.     No facility-administered medications prior to visit.    Review of Systems  Constitutional:  Negative for chills, diaphoresis, fever, malaise/fatigue and weight loss.  HENT:  Negative for congestion.   Respiratory:  Negative for cough, hemoptysis, sputum production, shortness of breath and wheezing.   Cardiovascular:  Negative for chest pain, palpitations and leg swelling.     Objective:   Vitals:   06/01/24 1057  BP: 112/71  Pulse: 84  SpO2: 96%  Weight: 90 lb 6.4 oz (41 kg)  Height: 5'  5 (1.651 m)   SpO2: 96 %  Physical Exam: General: Well-appearing, no acute distress HENT: [place], AT Eyes: EOMI, no scleral icterus Respiratory: Clear to auscultation bilaterally.  No crackles, wheezing or rales Cardiovascular: RRR, -M/R/G, no JVD Extremities:-Edema,-tenderness Neuro: AAO x4, CNII-XII grossly intact Psych: Normal mood, normal affect  Data Reviewed:  Imaging: CTA Chest 05/27/24 - Significant for complete occlusion of the bronchus intermedius in RML and RLL with collapse of RML RLL CXR 06/01/24 - Atelectasis     Assessment & Plan:   Discussion: HPI  Please arrive at 12:30 PM at [facility] Scheduled for bronchoscopy at 1:30 PM with Dr. Kassie  Nothing to eat after midnight.  No breakfast. Sips of water is OK  Health Maintenance Immunization History  Administered Date(s) Administered   Fluad Quad(high Dose 65+) 06/06/2019, 06/20/2020, 06/23/2021, 07/04/2022, 06/29/2023   INFLUENZA, HIGH DOSE SEASONAL PF 06/16/2016, 06/29/2017, 06/27/2018   Influenza Split 07/16/2011, 07/05/2012   Influenza,inj,Quad PF,6+ Mos 06/20/2013, 06/21/2014, 06/25/2015   PFIZER(Purple Top)SARS-COV-2 Vaccination 10/16/2019, 11/06/2019, 07/04/2020, 06/09/2021   Pfizer Covid-19 Vaccine Bivalent Booster 47yrs & up 05/19/2023   Pfizer(Comirnaty)Fall Seasonal Vaccine 12 years and older 06/22/2022   Pneumococcal Conjugate-13 08/14/2013   Pneumococcal Polysaccharide-23 05/07/2011   Tdap 05/07/2011   Zoster Recombinant(Shingrix ) 10/24/2021, 02/05/2022   Zoster, Live 02/16/2008    Orders Placed This Encounter  Procedures   Procedural/ Surgical Case Request: BRONCHOSCOPY, FLEXIBLE    Standing Status:   Future    Expiration Date:   06/01/2025    Pre-op diagnosis:   Atelectasis  No orders of the defined types were placed in this encounter.   No follow-ups on file.  I have spent a total time of 45-minutes on the day of the appointment reviewing prior documentation, coordinating care and discussing medical diagnosis and plan with the patient/family. Imaging, labs and tests included in this note have been reviewed and interpreted independently by me.  Mercedez Boule Slater Kassie, MD [facility] 06/01/2024 12:49 PM

## 2024-06-02 ENCOUNTER — Encounter (HOSPITAL_COMMUNITY): Payer: Self-pay | Admitting: Pulmonary Disease

## 2024-06-02 ENCOUNTER — Ambulatory Visit (HOSPITAL_COMMUNITY)

## 2024-06-02 ENCOUNTER — Ambulatory Visit (HOSPITAL_COMMUNITY): Admitting: Anesthesiology

## 2024-06-02 ENCOUNTER — Other Ambulatory Visit: Payer: Self-pay

## 2024-06-02 ENCOUNTER — Encounter (HOSPITAL_COMMUNITY)
Admission: RE | Disposition: A | Discharge status: Home / Self Care | Payer: Self-pay | Source: Home / Self Care | Attending: Pulmonary Disease

## 2024-06-02 ENCOUNTER — Ambulatory Visit (HOSPITAL_COMMUNITY)
Admission: RE | Admit: 2024-06-02 | Discharge: 2024-06-02 | Disposition: A | Discharge status: Home / Self Care | Attending: Pulmonary Disease | Admitting: Pulmonary Disease

## 2024-06-02 DIAGNOSIS — Z48813 Encounter for surgical aftercare following surgery on the respiratory system: Secondary | ICD-10-CM | POA: Diagnosis not present

## 2024-06-02 DIAGNOSIS — J9811 Atelectasis: Secondary | ICD-10-CM

## 2024-06-02 DIAGNOSIS — J189 Pneumonia, unspecified organism: Secondary | ICD-10-CM | POA: Diagnosis not present

## 2024-06-02 DIAGNOSIS — R918 Other nonspecific abnormal finding of lung field: Secondary | ICD-10-CM | POA: Diagnosis not present

## 2024-06-02 HISTORY — PX: BRONCHIAL BIOPSY: SHX5109

## 2024-06-02 HISTORY — PX: BRONCHIAL WASHINGS: SHX5105

## 2024-06-02 HISTORY — PX: FLEXIBLE BRONCHOSCOPY: SHX5094

## 2024-06-02 HISTORY — PX: BRONCHIAL BRUSHINGS: SHX5108

## 2024-06-02 LAB — BODY FLUID CELL COUNT WITH DIFFERENTIAL

## 2024-06-02 SURGERY — BRONCHOSCOPY, FLEXIBLE
Anesthesia: General | Laterality: Right

## 2024-06-02 MED ORDER — OXYCODONE HCL 5 MG/5ML PO SOLN: 5.0000 mg | Freq: Once | ORAL | Status: DC | PRN

## 2024-06-02 MED ORDER — PROPOFOL 1000 MG/100ML IV EMUL
INTRAVENOUS | Status: AC
Start: 2024-06-02 — End: 2024-06-02
  Filled 2024-06-02: qty 100

## 2024-06-02 MED ORDER — CHLORHEXIDINE GLUCONATE 0.12 % MT SOLN
15.0000 mL | Freq: Once | OROMUCOSAL | Status: AC
  Administered 2024-06-02: 15 mL via OROMUCOSAL

## 2024-06-02 MED ORDER — DEXMEDETOMIDINE HCL IN NACL 80 MCG/20ML IV SOLN
INTRAVENOUS | Status: AC
Start: 2024-06-02 — End: 2024-06-02
  Filled 2024-06-02: qty 20

## 2024-06-02 MED ORDER — LACTATED RINGERS IV SOLN: INTRAVENOUS | Status: DC | PRN

## 2024-06-02 MED ORDER — FENTANYL CITRATE (PF) 100 MCG/2ML IJ SOLN
INTRAMUSCULAR | Status: DC | PRN
  Administered 2024-06-02: 50 ug via INTRAVENOUS

## 2024-06-02 MED ORDER — SODIUM CHLORIDE 0.9 % IV SOLN: INTRAVENOUS | Status: DC

## 2024-06-02 MED ORDER — PROPOFOL 10 MG/ML IV BOLUS
INTRAVENOUS | Status: DC | PRN
Start: 2024-06-02 — End: 2024-06-02
  Administered 2024-06-02: 150 ug/kg/min via INTRAVENOUS
  Administered 2024-06-02: 100 mg via INTRAVENOUS

## 2024-06-02 MED ORDER — LIDOCAINE 2% (20 MG/ML) 5 ML SYRINGE
INTRAMUSCULAR | Status: DC | PRN
  Administered 2024-06-02: 20 mg via INTRAVENOUS

## 2024-06-02 MED ORDER — ROCURONIUM BROMIDE 10 MG/ML (PF) SYRINGE
PREFILLED_SYRINGE | INTRAVENOUS | Status: DC | PRN
  Administered 2024-06-02: 60 mg via INTRAVENOUS

## 2024-06-02 MED ORDER — FENTANYL CITRATE (PF) 100 MCG/2ML IJ SOLN: 25.0000 ug | INTRAMUSCULAR | Status: DC | PRN

## 2024-06-02 MED ORDER — OXYCODONE HCL 5 MG PO TABS: 5.0000 mg | ORAL_TABLET | Freq: Once | ORAL | Status: DC | PRN

## 2024-06-02 MED ORDER — ACETAMINOPHEN 500 MG PO TABS
1000.0000 mg | ORAL_TABLET | Freq: Once | ORAL | Status: AC
Start: 2024-06-02 — End: 2024-06-02
  Administered 2024-06-02: 1000 mg via ORAL

## 2024-06-02 MED ORDER — MEPERIDINE HCL 50 MG/ML IJ SOLN: 6.2500 mg | INTRAMUSCULAR | Status: DC | PRN

## 2024-06-02 MED ORDER — ONDANSETRON HCL 4 MG/2ML IJ SOLN
INTRAMUSCULAR | Status: DC | PRN
  Administered 2024-06-02: 4 mg via INTRAVENOUS

## 2024-06-02 MED ORDER — CHLORHEXIDINE GLUCONATE 0.12 % MT SOLN
OROMUCOSAL | Status: AC
  Filled 2024-06-02: qty 15

## 2024-06-02 MED ORDER — MIDAZOLAM HCL 2 MG/2ML IJ SOLN: 0.5000 mg | Freq: Once | INTRAMUSCULAR | Status: DC | PRN

## 2024-06-02 MED ORDER — FENTANYL CITRATE (PF) 100 MCG/2ML IJ SOLN
INTRAMUSCULAR | Status: AC
  Filled 2024-06-02: qty 2

## 2024-06-02 MED ORDER — PROPOFOL 10 MG/ML IV BOLUS
INTRAVENOUS | Status: AC
  Filled 2024-06-02: qty 20

## 2024-06-02 MED ORDER — SUGAMMADEX SODIUM 200 MG/2ML IV SOLN
INTRAVENOUS | Status: DC | PRN
  Administered 2024-06-02: 160 mg via INTRAVENOUS

## 2024-06-02 NOTE — Anesthesia Preprocedure Evaluation (Addendum)
 Anesthesia Evaluation  Patient identified by MRN, date of birth, ID band Patient awake    Reviewed: Allergy & Precautions, NPO status , Patient's Chart, lab work & pertinent test results  History of Anesthesia Complications Negative for: history of anesthetic complications  Airway Mallampati: I  TM Distance: >3 FB Neck ROM: Full   Comment: Chronic hoarseness Dental  (+) Dental Advisory Given, Caps   Pulmonary pneumonia CTA Chest 05/27/24 - Significant for complete occlusion of the bronchus intermedius in RML and RLL with collapse of RML RLL: post pneumonia   breath sounds clear to auscultation       Cardiovascular negative cardio ROS  Rhythm:Regular Rate:Normal     Neuro/Psych negative neurological ROS     GI/Hepatic negative GI ROS, Neg liver ROS,,,  Endo/Other  negative endocrine ROS    Renal/GU negative Renal ROS     Musculoskeletal   Abdominal   Peds  Hematology Hb 14.7, plt 357k   Anesthesia Other Findings   Reproductive/Obstetrics                              Anesthesia Physical Anesthesia Plan  ASA: 2  Anesthesia Plan: General   Post-op Pain Management: Tylenol  PO (pre-op)*   Induction: Intravenous  PONV Risk Score and Plan: 3 and Ondansetron , Dexamethasone and Treatment may vary due to age or medical condition  Airway Management Planned: Oral ETT  Additional Equipment: None  Intra-op Plan:   Post-operative Plan: Extubation in OR  Informed Consent: I have reviewed the patients History and Physical, chart, labs and discussed the procedure including the risks, benefits and alternatives for the proposed anesthesia with the patient or authorized representative who has indicated his/her understanding and acceptance.     Dental advisory given  Plan Discussed with: CRNA and Surgeon  Anesthesia Plan Comments:          Anesthesia Quick Evaluation

## 2024-06-02 NOTE — Op Note (Signed)
 [facility] Cardiopulmonary Patient Name: [name] Procedure Date: 06/02/2024 MRN: [id number] Attending MD: Slater Staff , MD, [id number] Date of Birth: May 23, 1946 CSN: [id number] Age: 78 Admit Type: Outpatient Ethnicity: Not Hispanic or Latino Procedure:             Bronchoscopy Indications:           Atelectasis of the right middle lobe, Atelectasis of                         the right lower lobe Providers:             Slater Staff, MD, Ozell Pouch, Haskel Chris,                         Technician Referring MD:           Medicines:              Complications:         No immediate complications Estimated Blood Loss:  Estimated blood loss was minimal. Procedure:      Pre-Anesthesia Assessment:      - A History and Physical has been performed. Patient meds and allergies       have been reviewed. The risks and benefits of the procedure and the       sedation options and risks were discussed with the patient. All       questions were answered and informed consent was obtained. Patient       identification and proposed procedure were verified prior to the       procedure by the physician in the pre-procedure area. Mental Status       Examination: alert and oriented. Airway Examination: normal       oropharyngeal airway. Respiratory Examination: clear to auscultation and       poor air movement in the right lung. CV Examination: RRR, no murmurs, no       S3 or S4. ASA Grade Assessment: I - A normal healthy patient. After       reviewing the risks and benefits, the patient was deemed in satisfactory       condition to undergo the procedure. The anesthesia plan was to use       general anesthesia. Immediately prior to administration of medications,       the patient was re-assessed for adequacy to receive sedatives. The heart       rate, respiratory rate, oxygen saturations, blood pressure, adequacy of       pulmonary ventilation, and response to care were  monitored throughout       the procedure. The physical status of the patient was re-assessed after       the procedure.      After obtaining informed consent, the bronchoscope was passed under       direct vision. Throughout the procedure, the patient's blood pressure,       pulse, and oxygen saturations were monitored continuously. the [id number]       ([id number]) Olympus bronchoscope was introduced through the mouth, via the       endotracheal tube and advanced to the tracheobronchial tree. The       procedure was accomplished with ease. Findings:      The endotracheal tube is in good position. The visualized portion of the       trachea is of normal caliber. The carina is sharp.  The tracheobronchial       tree was examined to at least the first subsegmental level. Bronchial       mucosa and anatomy are normal except for webbed scarring at the entrance       of the right middle lobe. Brushings x 3 obtained in RML for cytology.       Forcep biopsy of RML x 3 for pathology. BAL with 80 cc saline instilled       and 15 cc collected for cell count and micro. Thick secretions present       in RML. Minimal bleeding. Cold saline 20 cc with no evidence of active       bleeding. Bronchoscope withdrawn after airway suctioned and cleared. Impression:      - Atelectasis of the right middle lobe Moderate Sedation:      General anesthesia Recommendation:      - Await test results. Procedure Code(s):      --- Professional ---      [phone], Bronchoscopy, rigid or flexible, including fluoroscopic guidance,       when performed; diagnostic, with cell washing, when performed (separate       procedure) Diagnosis Code(s):      --- Professional ---      J98.11, Atelectasis CPT copyright 2022 American Medical Association. All rights reserved. The codes documented in this report are preliminary and upon coder review may  be revised to meet current compliance requirements. Slater Staff, MD 06/02/2024 3:15:40  PM This report has been signed electronically. Number of Addenda: 0 Scope In: Scope Out:

## 2024-06-02 NOTE — Anesthesia Postprocedure Evaluation (Signed)
 Anesthesia Post Note  Patient: [name]  Procedure(s) Performed: BRONCHOSCOPY, FLEXIBLE (Right) BRONCHOSCOPY, WITH BRUSH BIOPSY BRONCHOSCOPY, WITH BIOPSY IRRIGATION, BRONCHUS     Patient location during evaluation: Endoscopy Anesthesia Type: General Level of consciousness: awake and alert, oriented and patient cooperative Pain management: pain level controlled Vital Signs Assessment: post-procedure vital signs reviewed and stable Respiratory status: spontaneous breathing, nonlabored ventilation and respiratory function stable Cardiovascular status: blood pressure returned to baseline and stable Postop Assessment: no apparent nausea or vomiting and adequate PO intake Anesthetic complications: no   No notable events documented.  Last Vitals:  Vitals:   06/02/24 1520 06/02/24 1531  BP: (!) 113/49 (!) 106/52  Pulse: 73 74  Resp: 19 19  Temp:    SpO2: 100% 100%    Last Pain:  Vitals:   06/02/24 1516  TempSrc: Temporal  PainSc: 0-No pain                 Arney Mayabb,E. Rollin Kotowski

## 2024-06-02 NOTE — Transfer of Care (Signed)
 Immediate Anesthesia Transfer of Care Note  Patient: [name]  Procedure(s) Performed: Procedure(s): BRONCHOSCOPY, FLEXIBLE (Right) BRONCHOSCOPY, WITH BRUSH BIOPSY BRONCHOSCOPY, WITH BIOPSY IRRIGATION, BRONCHUS  Patient Location: PACU  Anesthesia Type:General  Level of Consciousness:  sedated, patient cooperative and responds to stimulation  Airway & Oxygen Therapy:Patient Spontanous Breathing and Patient connected to face mask oxgen  Post-op Assessment:  Report given to PACU RN and Post -op Vital signs reviewed and stable  Post vital signs:  Reviewed and stable  Last Vitals:  Vitals:   06/02/24 1244  BP: 138/70  Pulse: 79  Resp: 16  Temp: 36.4 C  SpO2: 100%    Complications: No apparent anesthesia complications

## 2024-06-03 LAB — BODY FLUID CELL COUNT WITH DIFFERENTIAL
Eos, Fluid: 1 %
Lymphs, Fluid: 12 %
Monocyte-Macrophage-Serous Fluid: 1 % — ABNORMAL LOW (ref 50–90)
Neutrophil Count, Fluid: 86 % — ABNORMAL HIGH (ref 0–25)
Total Nucleated Cell Count, Fluid: 149 uL (ref 0–1000)

## 2024-06-04 ENCOUNTER — Encounter (HOSPITAL_COMMUNITY): Payer: Self-pay | Admitting: Pulmonary Disease

## 2024-06-04 LAB — ACID FAST SMEAR (AFB, MYCOBACTERIA)
Acid Fast Smear: NEGATIVE
Source (AFB): 183518

## 2024-06-05 LAB — CYTOLOGY - NON PAP

## 2024-06-05 LAB — SURGICAL PATHOLOGY

## 2024-06-05 LAB — CULTURE, BAL-QUANTITATIVE W GRAM STAIN
Culture: NO GROWTH
Gram Stain: NONE SEEN

## 2024-06-07 ENCOUNTER — Ambulatory Visit: Payer: Self-pay | Admitting: Pulmonary Disease

## 2024-06-07 LAB — ANAEROBIC CULTURE W GRAM STAIN: Gram Stain: NONE SEEN

## 2024-06-22 ENCOUNTER — Ambulatory Visit (INDEPENDENT_AMBULATORY_CARE_PROVIDER_SITE_OTHER): Admitting: Pulmonary Disease

## 2024-06-22 ENCOUNTER — Encounter (HOSPITAL_BASED_OUTPATIENT_CLINIC_OR_DEPARTMENT_OTHER): Payer: Self-pay | Admitting: Pulmonary Disease

## 2024-06-22 ENCOUNTER — Ambulatory Visit: Payer: Self-pay | Admitting: Pulmonary Disease

## 2024-06-22 VITALS — BP 114/75 | HR 88 | Ht 65.0 in | Wt 90.0 lb

## 2024-06-22 DIAGNOSIS — J42 Unspecified chronic bronchitis: Secondary | ICD-10-CM

## 2024-06-22 DIAGNOSIS — J984 Other disorders of lung: Secondary | ICD-10-CM | POA: Diagnosis not present

## 2024-06-22 MED ORDER — AZITHROMYCIN 250 MG PO TABS: ORAL_TABLET | ORAL | 0 refills | Status: DC

## 2024-06-22 MED ORDER — PREDNISONE 10 MG PO TABS: ORAL_TABLET | ORAL | 0 refills | Status: AC

## 2024-06-22 NOTE — Progress Notes (Signed)
 Subjective:   PATIENT ID: [name] GENDER: female DOB: 1946/05/24, MRN: [id number]  Chief Complaint  Patient presents with   Shortness of Breath    Acute visit     Reason for Visit: ED follow-up.   [name] is a 78 year old female never smoker with CAD who presents for follow-up.   Initial consult Previously seen by Dr. Annella and Dr. Darlean for aspiration of vitamin C tablet two years ago and havent really felt the same. Has a sensation of foreign object. Swallow test with mild barium retention transiently with normal clearance after swallowing thick barium. Mild esophageal dysmotility. She was seen in the ED. She was using the inventive spirometer and flutter valve multiple times since then. CXR demonstrates persistent atelectasis.  06/22/24 She underwent bronchoscopy on 06/02/24 and found with narrowed entry of right middle lobe with significant scarring and decreased opening >90%. This likely is contributing to her her cough due to inability to clear mucous. Suspect related to aspiration of vitamin C pill two years ago. Scar pain is likely contributing as well. She is having worsening discomfort again.   Past Medical History:  Diagnosis Date   Adenomatous colon polyp    Atrophic vaginitis    Benign neoplasm of colon 01/18/2008   DIVERTICULOSIS, MILD 10/26/2007   colonoscopy 10/26/07   H pylori ulcer    HEMORRHOIDS, INTERNAL 01/18/2008   OSTEOPENIA 10/2018   T score 2.1 FRAX 14% / 6%   Skin disorder    NOT PARAPSORIASIS--ETIOLOGY UNCLEAR   Ulcer 01/31/06   STOMACH ULCER     Family History  Problem Relation Age of Onset   Breast cancer Mother        Age 58's   Heart disease Mother    Heart disease Father    Breast cancer Maternal Aunt        Age 31's   Heart disease Maternal Grandmother    Colon cancer Maternal Grandfather    Breast cancer Cousin        maternal   Colon cancer Cousin    Colon cancer Maternal Uncle    Breast cancer Cousin    Breast  cancer Cousin      Social History   Occupational History   Occupation: Airline pilot  Tobacco Use   Smoking status: Never   Smokeless tobacco: Never  Vaping Use   Vaping status: Never Used  Substance and Sexual Activity   Alcohol use: Yes    Alcohol/week: 7.0 standard drinks of alcohol    Types: 7 Glasses of wine per week   Drug use: No   Sexual activity: Not Currently    Birth control/protection: Surgical, Post-menopausal    Comment: BTL-1st intercourse 78 yo-Fewer than 5 partners    Allergies  Allergen Reactions   Fosamax [Alendronate Sodium] Other (See Comments)    Abdominal pain   Zanaflex  [Tizanidine ]     Did not feel good     Outpatient Medications Prior to Visit  Medication Sig Dispense Refill   cholecalciferol (VITAMIN D ) 1000 UNITS tablet Take 2,000 Units by mouth daily.     Multiple Vitamins-Minerals (MULTIVITAMIN ADULTS 50+) TABS      No facility-administered medications prior to visit.    Review of Systems  Constitutional:  Negative for chills, diaphoresis, fever, malaise/fatigue and weight loss.  HENT:  Negative for congestion.   Respiratory:  Positive for cough. Negative for hemoptysis, sputum production, shortness of breath and wheezing.   Cardiovascular:  Negative for  chest pain (chest discomfort), palpitations and leg swelling.     Objective:   Vitals:   06/22/24 1452  BP: 114/75  Pulse: 88  SpO2: 92%  Weight: 90 lb (40.8 kg)  Height: 5' 5 (1.651 m)   SpO2: 92 %  Physical Exam: General: Well-appearing, no acute distress HENT: [place], AT Eyes: EOMI, no scleral icterus Respiratory: Clear to auscultation bilaterally.  No crackles, wheezing or rales Cardiovascular: RRR, -M/R/G, no JVD Extremities:-Edema,-tenderness Neuro: AAO x4, CNII-XII grossly intact Psych: Normal mood, normal affect  Data Reviewed:  Imaging: CTA Chest 05/27/24 - Significant for complete occlusion of the bronchus intermedius in RML and RLL with collapse of RML RLL CXR  06/01/24 - Atelectasis CXR 06/02/24 - Improved aeration of right lower lobe     Assessment & Plan:   Discussion: 78 year old female never smoker with CAD who presents with prior history of foreign body aspiration (vitamin C tablet) who presents post-bronchoscopy with persistent cough.  Bronchoscopy on 06/02/24 and found with narrowed entry of right middle lobe with significant scarring and decreased opening >90%. This likely is contributing to her her cough due to inability to clear mucous. Suspect related to aspiration of vitamin C pill two years ago. Scar pain is likely contributing as well.  Chronic bronchitis of right middle lobe  Scarring secondary to aspiration Mucous retention --Azithromycin  --Prednisone ordered. Take if azithromycin  is ineffective --Declined pain medications due to side effects --Counseled on supportive management as limited management options  Health Maintenance Immunization History  Administered Date(s) Administered   Fluad Quad(high Dose 65+) 06/06/2019, 06/20/2020, 06/23/2021, 07/04/2022, 06/29/2023   INFLUENZA, HIGH DOSE SEASONAL PF 06/16/2016, 06/29/2017, 06/27/2018   Influenza Split 07/16/2011, 07/05/2012   Influenza,inj,Quad PF,6+ Mos 06/20/2013, 06/21/2014, 06/25/2015   PFIZER(Purple Top)SARS-COV-2 Vaccination 10/16/2019, 11/06/2019, 07/04/2020, 06/09/2021   Pfizer Covid-19 Vaccine Bivalent Booster 7yrs & up 05/19/2023   Pfizer(Comirnaty)Fall Seasonal Vaccine 12 years and older 06/22/2022   Pneumococcal Conjugate-13 08/14/2013   Pneumococcal Polysaccharide-23 05/07/2011   Tdap 05/07/2011   Zoster Recombinant(Shingrix ) 10/24/2021, 02/05/2022   Zoster, Live 02/16/2008    No orders of the defined types were placed in this encounter.  Meds ordered this encounter  Medications   azithromycin  (ZITHROMAX ) 250 MG tablet    Sig: Take two tablets on day 1, then one tablet daily on day 2-5.    Dispense:  6 tablet    Refill:  0   predniSONE (DELTASONE) 10  MG tablet    Sig: Take 4 tablets (40 mg total) by mouth daily with breakfast for 2 days, THEN 3 tablets (30 mg total) daily with breakfast for 2 days, THEN 2 tablets (20 mg total) daily with breakfast for 2 days, THEN 1 tablet (10 mg total) daily with breakfast for 2 days.    Dispense:  20 tablet    Refill:  0    Return for Reschedule in 3 months.  I have spent a total time of 30-minutes on the day of the appointment including chart review, data review, collecting history, coordinating care and discussing medical diagnosis and plan with the patient/family. Past medical history, allergies, medications were reviewed. Pertinent imaging, labs and tests included in this note have been reviewed and interpreted independently by me.  Vaunda Gutterman Slater Staff, MD [facility] 06/22/2024 3:22 PM

## 2024-06-22 NOTE — Patient Instructions (Signed)
  Chronic bronchitis of right middle lobe Mucous retention --Azithromycin  --Prednisone ordered. Take if azithromycin  is ineffective --Declined pain medications due to side effects

## 2024-06-22 NOTE — Telephone Encounter (Signed)
 FYI Only or Action Required?: FYI only for provider.  Patient is followed in Pulmonology for Atelectasis, last seen on 06/01/2024 by Kassie Acquanetta Bradley, MD.  Called Nurse Triage reporting Cough.  Symptoms began yesterday.  Interventions attempted: Increased fluids/rest.  Symptoms are: gradually worsening.  Triage Disposition: See HCP Within 4 Hours (Or PCP Triage)  Patient/caregiver understands and will follow disposition?: Yes       Copied from CRM #[id number]. Topic: Clinical - Red Word Triage >> Jun 22, 2024 12:59 PM Joesph PARAS wrote: Red Word that prompted transfer to Nurse Triage: Patient states having chest pains up under right breast, in chest, in shoulders and back. Reason for Disposition  [1] MILD difficulty breathing (e.g., minimal/no SOB at rest, SOB with walking, pulse < 100) AND [2] still present when not coughing  Answer Assessment - Initial Assessment Questions 1. ONSET: When did the cough begin?      X 6 days 3. SPUTUM: Describe the color of your sputum (e.g., none, dry cough; clear, white, yellow, green)     Cloudy 4. HEMOPTYSIS: Are you coughing up any blood? If Yes, ask: How much? (e.g., flecks, streaks, tablespoons, etc.)     None 5. DIFFICULTY BREATHING: Are you having difficulty breathing? If Yes, ask: How bad is it? (e.g., mild, moderate, severe)      SOB when bending over 6. FEVER: Do you have a fever? If Yes, ask: What is your temperature, how was it measured, and when did it start?     None 7. CARDIAC HISTORY: Do you have any history of heart disease? (e.g., heart attack, congestive heart failure)      None 8. LUNG HISTORY: Do you have any history of lung disease?  (e.g., pulmonary embolus, asthma, emphysema)     Restrictive Lung Disease 10. OTHER SYMPTOMS: Do you have any other symptoms? (e.g., runny nose, wheezing, chest pain)       Pain right chest under rib cage radiates around to back/shoulder blade started last  evening  Protocols used: Cough - Acute Productive-A-AH

## 2024-06-22 NOTE — Telephone Encounter (Signed)
 FYI

## 2024-06-27 ENCOUNTER — Ambulatory Visit: Admitting: Internal Medicine

## 2024-07-04 LAB — FUNGUS CULTURE WITH STAIN: Fungal Source: 188243

## 2024-07-04 LAB — FUNGUS CULTURE RESULT

## 2024-07-04 LAB — FUNGAL ORGANISM REFLEX

## 2024-07-05 ENCOUNTER — Ambulatory Visit (HOSPITAL_BASED_OUTPATIENT_CLINIC_OR_DEPARTMENT_OTHER): Admitting: Pulmonary Disease

## 2024-07-10 ENCOUNTER — Encounter: Payer: Self-pay | Admitting: Family Medicine

## 2024-07-10 ENCOUNTER — Ambulatory Visit: Payer: Self-pay | Admitting: Family Medicine

## 2024-07-10 ENCOUNTER — Ambulatory Visit (INDEPENDENT_AMBULATORY_CARE_PROVIDER_SITE_OTHER): Admitting: Family Medicine

## 2024-07-10 VITALS — BP 108/66 | HR 63 | Temp 97.8°F | Ht 65.0 in | Wt 91.8 lb

## 2024-07-10 DIAGNOSIS — Z780 Asymptomatic menopausal state: Secondary | ICD-10-CM

## 2024-07-10 DIAGNOSIS — R739 Hyperglycemia, unspecified: Secondary | ICD-10-CM

## 2024-07-10 DIAGNOSIS — R748 Abnormal levels of other serum enzymes: Secondary | ICD-10-CM

## 2024-07-10 DIAGNOSIS — M810 Age-related osteoporosis without current pathological fracture: Secondary | ICD-10-CM

## 2024-07-10 DIAGNOSIS — J9811 Atelectasis: Secondary | ICD-10-CM | POA: Diagnosis not present

## 2024-07-10 DIAGNOSIS — Z13820 Encounter for screening for osteoporosis: Secondary | ICD-10-CM | POA: Diagnosis not present

## 2024-07-10 LAB — COMPREHENSIVE METABOLIC PANEL WITH GFR
ALT: 24 U/L (ref 0–35)
AST: 46 U/L — ABNORMAL HIGH (ref 0–37)
Albumin: 4.5 g/dL (ref 3.5–5.2)
Alkaline Phosphatase: 83 U/L (ref 39–117)
BUN: 14 mg/dL (ref 6–23)
CO2: 29 meq/L (ref 19–32)
Calcium: 9.5 mg/dL (ref 8.4–10.5)
Chloride: 101 meq/L (ref 96–112)
Creatinine, Ser: 0.56 mg/dL (ref 0.40–1.20)
GFR: 87.2 mL/min (ref >60.00)
Glucose, Bld: 92 mg/dL (ref 70–99)
Potassium: 4.2 meq/L (ref 3.5–5.1)
Sodium: 138 meq/L (ref 135–145)
Total Bilirubin: 0.6 mg/dL (ref 0.2–1.2)
Total Protein: 6.9 g/dL (ref 6.0–8.3)

## 2024-07-10 LAB — HEMOGLOBIN A1C: Hgb A1c MFr Bld: 6 % (ref 4.6–6.5)

## 2024-07-10 NOTE — Assessment & Plan Note (Signed)
 I will reassess liver enzymes today.

## 2024-07-10 NOTE — Assessment & Plan Note (Signed)
 Appears secondary to bronchial scarring and mucous plugging. This set her up for a recent lower lung pneumonia and pleural effusion. Following with pulmonology.

## 2024-07-10 NOTE — Progress Notes (Signed)
 [facility]ARE [facility] [address] [place] [address] Dept: [phone] Dept Fax: [phone]  Transfer of Care Office Visit  Subjective:    Patient ID: [name], female    DOB: [birth year], 78 y.o..   MRN: [id number]  Chief Complaint  Patient presents with   Establish Care    [facility]- establish care.       History of Present Illness:  Patient is in today to establish care. [name] was born in [place], [place] and grew up on a farm in [place], [place]. 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 She worked as a Water quality scientist. She lived in [place], [place] and later [place], [place], before moving to [place]. [name] was married for 47 years, before her husband died in Feb 20, 2023. She has two step-sons (61, 58) and 4 grandchildren. She denies tobacco use. She drinks alcohol about once a week.  [name] has had a recent issue with a pulmonary infection. She was seen in August at [facility]. Her x-ray showed right lower lobe infiltrates and a small pleural effusion. Dr. Garald treated her with a Z-pack, Augmentin,a nd a course of prednisone. She was seen about 10 days later at [facility] with progressive right chest pain. Her chest CT scan showed blockage of her right middle and lower lobe bronchi with resulting atelectasis, scattered ground glass opacities of the right upper lobe, and a small right pleural effusion. She was seen by Dr. Kassie, who performed a bronchoscopy. bacterial, fungal,and Tb studies were negative. Also there was no sign of cancer. She prescribed a second course of azithromycin  and provided a prednisone prescription to take if her symptoms are not improving. By history, [name] had aspirated a Vitamin C tablet 2 years ago. It is felt this caused an area of scarring that has contributed ot her bronchial plugging.  Past Medical  History: Patient Active Problem List   Diagnosis Date Noted   Atelectasis 06/01/2024   Pruritus 08/24/2023   Restrictive lung disease 08/27/2022   Upper airway cough syndrome 04/02/2022   Elevated liver enzymes 01/07/2022   Esophageal motility disorder 06/23/2021   Coronary atherosclerosis 07/31/2019   Well adult exam 04/27/2016   Degenerative disc disease, lumbar 12/14/2013   Piriformis syndrome of left side 08/07/2013   Atrophic vaginitis    Chronic fatigue disorder 07/03/2008   Internal hemorrhoids 01/18/2008   Osteopenia 01/18/2008   Diverticulosis of colon 10/26/2007   Personal history of peptic ulcer disease 10/26/2007   Gastritis and gastroduodenitis 06/10/2007   History of colonic polyps 06/10/2007   Past Surgical History:  Procedure Laterality Date   APPENDECTOMY  1963   BRONCHIAL BIOPSY  06/02/2024   Procedure: BRONCHOSCOPY, WITH BIOPSY;  Surgeon: Kassie Acquanetta Bradley, MD;  Location: [facility];  Service: Cardiopulmonary;;   BRONCHIAL BRUSHINGS  06/02/2024   Procedure: BRONCHOSCOPY, WITH BRUSH BIOPSY;  Surgeon: Kassie Acquanetta Bradley, MD;  Location: [facility];  Service: Cardiopulmonary;;   BRONCHIAL WASHINGS  06/02/2024   Procedure: GAYNELL MANI;  Surgeon: Kassie Acquanetta Bradley, MD;  Location: [facility];  Service: Cardiopulmonary;;   COLONOSCOPY W/ BIOPSIES     multiple   ESOPHAGOGASTRODUODENOSCOPY  03/30/2006   FLEXIBLE BRONCHOSCOPY Right 06/02/2024   Procedure: ELLIOTT SIDE;  Surgeon: Kassie Acquanetta Bradley, MD;  Location: [facility];  Service: Cardiopulmonary;  Laterality: Right;   GANGLION CYST EXCISION  1972   Right   Gastric Ulcer and GI  Bleed (hospitalization)  5/07   LYMPH NODE BIOPSY  2001   Benign from the groin-Inflammatory changes due to her skin condition.   s/p eradication of H. Pylori     confirmed by urea breath teast   SKIN CANCER EXCISION     removed from the forehead   TONSILLECTOMY  1955   TUBAL LIGATION     Family History  Problem  Relation Age of Onset   Breast cancer Mother        Age 38's   Heart disease Mother    Arthritis Mother    Cancer Mother    Obesity Mother    Heart disease Father    Breast cancer Maternal Aunt        Age 32's   Heart disease Maternal Grandmother    Colon cancer Maternal Grandfather    Breast cancer Cousin        maternal   Colon cancer Cousin    Colon cancer Maternal Uncle    Breast cancer Cousin    Breast cancer Cousin    Outpatient Medications Prior to Visit  Medication Sig Dispense Refill   cholecalciferol (VITAMIN D ) 1000 UNITS tablet Take 2,000 Units by mouth daily.     Multiple Vitamins-Minerals (MULTIVITAMIN ADULTS 50+) TABS      azithromycin  (ZITHROMAX ) 250 MG tablet Take two tablets on day 1, then one tablet daily on day 2-5. 6 tablet 0   No facility-administered medications prior to visit.   Allergies  Allergen Reactions   Fosamax [Alendronate Sodium] Other (See Comments)    Abdominal pain   Zanaflex  [Tizanidine ]     Did not feel good      Objective:   Today's Vitals   07/10/24 1001  BP: 108/66  Pulse: 63  Temp: 97.8 F (36.6 C)  TempSrc: Temporal  SpO2: 100%  Weight: 91 lb 12.8 oz (41.6 kg)  Height: 5' 5 (1.651 m)   Body mass index is 15.28 kg/m.   General: Well developed, well nourished. No acute distress. Psych: Alert and oriented. Normal mood and affect.  Health Maintenance Due  Topic Date Due   DTaP/Tdap/Td (2 - Td or Tdap) 05/06/2021   Lab Results    Latest Ref Rng & Units 05/27/2024    1:13 PM 08/24/2023   11:08 AM 01/07/2022    9:21 AM  CBC  WBC 4.0 - 10.5 K/uL 8.0  6.3  8.9   Hemoglobin 12.0 - 15.0 g/dL 85.2  85.8  86.4   Hematocrit 36.0 - 46.0 % 43.8  42.7  41.0   Platelets 150 - 400 K/uL 357  334.0  328.0       Latest Ref Rng & Units 05/27/2024    1:13 PM 08/24/2023   11:08 AM 07/13/2022    3:04 PM  CMP  Glucose 70 - 99 mg/dL 774  895  99   BUN 8 - 23 mg/dL 15  16  15    Creatinine 0.44 - 1.00 mg/dL 9.26  9.35  9.26    Sodium 135 - 145 mmol/L 139  138  141   Potassium 3.5 - 5.1 mmol/L 4.4  4.1  4.1   Chloride 98 - 111 mmol/L 101  101  104   CO2 22 - 32 mmol/L 25  33  30   Calcium 8.9 - 10.3 mg/dL 89.7  9.5  9.5   Total Protein 6.0 - 8.3 g/dL  6.9  6.8   Total Bilirubin 0.2 - 1.2 mg/dL  0.6  0.4  Alkaline Phos 39 - 117 U/L  85  81   AST 0 - 37 U/L  49  42   ALT 0 - 35 U/L  23  16      Imaging: Chest x-ray (06/02/2024) IMPRESSION: Improved aeration of right lower lobe suggesting decreased atelectasis or infiltrate. No pneumothorax is noted.  CT of Abdomen and Pelvis, CTA of Chest (05/27/2024) IMPRESSION: Chest: 1. No evidence of pulmonary embolus. 2. Complete occlusion of the bronchus intermedius as well as the right middle and right lower lobe bronchi. This may reflect aspiration or mucoid impaction, though central obstructing lesion cannot be excluded. Follow-up imaging after aggressive pulmonary toilet may be useful. 3. Scattered ground-glass opacities within the aerated right upper lobe, which may reflect inflammatory or infectious etiology. 4. Small free-flowing right pleural effusion.   Abdomen/pelvis: 1. No acute intra-abdominal or intrapelvic process.  Assessment & Plan:   Problem List Items Addressed This Visit       Respiratory   Atelectasis - Primary   Appears secondary to bronchial scarring and mucous plugging. This set her up for a recent lower lung pneumonia and pleural effusion. Following with pulmonology.        Other   Elevated liver enzymes   I will reassess liver enzymes today.      Relevant Orders   Comprehensive metabolic panel with GFR   Other Visit Diagnoses       Hyperglycemia       Likely secondary to steroid use. I will check a blodo sugar and A1c.   Relevant Orders   Comprehensive metabolic panel with GFR   Hemoglobin A1c     Postmenopausal       Relevant Orders   DG Bone Density     Screening for osteoporosis       Relevant Orders   DG Bone  Density       Return in about 3 months (around 10/10/2024) for Reassessment.   Garnette CHRISTELLA Simpler, MD

## 2024-07-17 LAB — ACID FAST CULTURE WITH REFLEXED SENSITIVITIES (MYCOBACTERIA)
Acid Fast Culture: NEGATIVE
Source of Sample: 183526

## 2024-07-18 ENCOUNTER — Telehealth (HOSPITAL_BASED_OUTPATIENT_CLINIC_OR_DEPARTMENT_OTHER): Payer: Self-pay

## 2024-07-25 ENCOUNTER — Ambulatory Visit (HOSPITAL_BASED_OUTPATIENT_CLINIC_OR_DEPARTMENT_OTHER)
Admission: RE | Admit: 2024-07-25 | Discharge: 2024-07-25 | Disposition: A | Discharge status: Home / Self Care | Source: Ambulatory Visit | Attending: Family Medicine | Admitting: Family Medicine

## 2024-07-25 DIAGNOSIS — Z780 Asymptomatic menopausal state: Secondary | ICD-10-CM | POA: Insufficient documentation

## 2024-07-25 DIAGNOSIS — Z13820 Encounter for screening for osteoporosis: Secondary | ICD-10-CM | POA: Diagnosis not present

## 2024-07-25 DIAGNOSIS — M810 Age-related osteoporosis without current pathological fracture: Secondary | ICD-10-CM | POA: Diagnosis not present

## 2024-07-25 MED ORDER — RISEDRONATE SODIUM 5 MG PO TABS: 5.0000 mg | ORAL_TABLET | Freq: Every day | ORAL | 3 refills | Status: DC

## 2024-07-25 NOTE — Addendum Note (Signed)
 Addended by: THEDORA GARNETTE HERO on: 07/25/2024 10:47 AM   Modules accepted: Orders

## 2024-08-09 NOTE — Addendum Note (Signed)
 Addended by: THEDORA GARNETTE HERO on: 08/09/2024 03:50 PM   Modules accepted: Orders

## 2024-08-09 NOTE — Telephone Encounter (Signed)
 Can you place the orders to re-check her liver levels again?  Then I will let her know to schedule a lab appointment.  Thanks. Dm/cma

## 2024-08-10 ENCOUNTER — Other Ambulatory Visit (INDEPENDENT_AMBULATORY_CARE_PROVIDER_SITE_OTHER)

## 2024-08-10 DIAGNOSIS — R748 Abnormal levels of other serum enzymes: Secondary | ICD-10-CM | POA: Diagnosis not present

## 2024-08-10 LAB — HEPATIC FUNCTION PANEL
ALT: 24 U/L (ref 0–35)
AST: 46 U/L — ABNORMAL HIGH (ref 0–37)
Albumin: 4.4 g/dL (ref 3.5–5.2)
Alkaline Phosphatase: 85 U/L (ref 39–117)
Bilirubin, Direct: 0.1 mg/dL (ref 0.0–0.3)
Total Bilirubin: 0.7 mg/dL (ref 0.2–1.2)
Total Protein: 6.8 g/dL (ref 6.0–8.3)

## 2024-08-11 ENCOUNTER — Ambulatory Visit: Payer: Self-pay | Admitting: Family Medicine

## 2024-09-25 ENCOUNTER — Ambulatory Visit (HOSPITAL_BASED_OUTPATIENT_CLINIC_OR_DEPARTMENT_OTHER): Admitting: Pulmonary Disease

## 2024-09-25 ENCOUNTER — Encounter (HOSPITAL_BASED_OUTPATIENT_CLINIC_OR_DEPARTMENT_OTHER): Payer: Self-pay | Admitting: Pulmonary Disease

## 2024-09-25 VITALS — BP 120/74 | HR 62 | Ht 65.0 in | Wt 93.6 lb

## 2024-09-25 DIAGNOSIS — J42 Unspecified chronic bronchitis: Secondary | ICD-10-CM | POA: Diagnosis not present

## 2024-09-25 DIAGNOSIS — J984 Other disorders of lung: Secondary | ICD-10-CM | POA: Diagnosis not present

## 2024-09-25 NOTE — Patient Instructions (Signed)
 Chronic bronchitis of right middle lobe  Scarring secondary to aspiration Mucous retention --Counseled on supportive management as limited management options --Previously tried azithromycin  and prednisone  without benefit --No indication for prophylactic medications --Can consider pulmonary function test if breathing issues worsen but suspect restriction

## 2024-09-25 NOTE — Progress Notes (Signed)
 "   Subjective:   PATIENT ID: [name]: female DOB: June 20, 1946, MRN: [id number]  Chief Complaint  Patient presents with   Follow-up    Chronic bronchitis    Reason for Visit: ED follow-up.   [name] is a 79 year old female never smoker with CAD who presents for follow-up.   Initial consult Previously seen by Dr. Annella and Dr. Darlean for aspiration of vitamin C tablet two years ago and havent really felt the same. Has a sensation of foreign object. Swallow test with mild barium retention transiently with normal clearance after swallowing thick barium. Mild esophageal dysmotility. She was seen in the ED. She was using the inventive spirometer and flutter valve multiple times since then. CXR demonstrates persistent atelectasis.  06/22/24 She underwent bronchoscopy on 06/02/24 and found with narrowed entry of right middle lobe with significant scarring and decreased opening >90%. This likely is contributing to her her cough due to inability to clear mucous. Suspect related to aspiration of vitamin C pill two years ago. Scar pain is likely contributing as well. She is having worsening discomfort again.  09/25/24 Since our visit she reports concerns about the scarring in her lungs. Shortness of breath with bending over. Occasionally has mucous production that she expels. No wheezing. No limitation in activity.    Past Medical History:  Diagnosis Date   Adenomatous colon polyp    Atrophic vaginitis    Benign neoplasm of colon 01/18/2008   Blood transfusion without reported diagnosis 2007   due to bleeding ulcer   DIVERTICULOSIS, MILD 10/26/2007   colonoscopy 10/26/07   H pylori ulcer    HEMORRHOIDS, INTERNAL 01/18/2008   OSTEOPENIA 10/2018   T score 2.1 FRAX 14% / 6%   Skin disorder    NOT PARAPSORIASIS--ETIOLOGY UNCLEAR   Ulcer 01/31/2006   STOMACH ULCER     Family History  Problem Relation Age of Onset   Breast cancer Mother        Age 78's   Heart  disease Mother    Arthritis Mother    Obesity Mother    Dementia Father    Heart disease Father    Peptic Ulcer Disease Father    Diabetes Brother    Breast cancer Maternal Aunt        Age 49's   Colon cancer Maternal Uncle    AAA (abdominal aortic aneurysm) Paternal Uncle    Heart disease Maternal Grandmother    Colon cancer Maternal Grandfather    Breast cancer Cousin        maternal   Colon cancer Cousin    Breast cancer Cousin    Breast cancer Cousin      Social History   Occupational History   Occupation: Airline Pilot  Tobacco Use   Smoking status: Never   Smokeless tobacco: Never  Vaping Use   Vaping status: Never Used  Substance and Sexual Activity   Alcohol use: Yes    Alcohol/week: 7.0 standard drinks of alcohol    Types: 7 Glasses of wine per week   Drug use: No   Sexual activity: Not Currently    Birth control/protection: Surgical, Post-menopausal    Comment: BTL-1st intercourse 79 yo-Fewer than 5 partners    Allergies  Allergen Reactions   Fosamax [Alendronate Sodium] Other (See Comments)    Abdominal pain   Zanaflex  [Tizanidine ]     Did not feel good     Outpatient Medications Prior to Visit  Medication Sig Dispense Refill  cholecalciferol (VITAMIN D ) 1000 UNITS tablet Take 2,000 Units by mouth daily.     Multiple Vitamins-Minerals (MULTIVITAMIN ADULTS 50+) TABS      risedronate  (ACTONEL ) 5 MG tablet Take 1 tablet (5 mg total) by mouth daily before breakfast. with water on empty stomach, nothing by mouth or lie down for next 30 minutes. (Patient not taking: Reported on 09/25/2024) 90 tablet 3   No facility-administered medications prior to visit.    Review of Systems  Constitutional:  Negative for chills, diaphoresis, fever, malaise/fatigue and weight loss.  HENT:  Negative for congestion.   Respiratory:  Positive for cough and sputum production. Negative for hemoptysis, shortness of breath and wheezing.   Cardiovascular:  Negative for chest pain,  palpitations and leg swelling.     Objective:   Vitals:   09/25/24 0934  BP: 120/74  Pulse: 62  SpO2: 97%  Weight: 93 lb 9.6 oz (42.5 kg)  Height: 5' 5 (1.651 m)   SpO2: 97 %  Physical Exam: General: Well-appearing, no acute distress HENT: [place], AT Eyes: EOMI, no scleral icterus Respiratory: Clear to auscultation bilaterally.  No crackles, wheezing or rales Cardiovascular: RRR, -M/R/G, no JVD Extremities:-Edema,-tenderness Neuro: AAO x4, CNII-XII grossly intact Psych: Normal mood, normal affect  Data Reviewed:  Imaging: CTA Chest 05/27/24 - Significant for complete occlusion of the bronchus intermedius in RML and RLL with collapse of RML RLL CXR 06/01/24 - Atelectasis CXR 06/02/24 - Improved aeration of right lower lobe   PFT: 06/09/22 FVC 1.12 (41%) FEV1 0.89 (43%) Ratio 79   Interpretation: Reduced FVC and FEV1     Assessment & Plan:   Discussion: 79 year old female never smoker with CAD who presents with prior history of foreign body aspiration (vitamin C tablet) who presents for follow-up. Counseled for clinical course of chronic bronchitis of the right middle lobe which is high risk for recurrent plugging and infection in the future. Limited options in management unfortunately except for good pulmonary hygiene.  Bronchoscopy on 06/02/24 and found with narrowed entry of right middle lobe with significant scarring and decreased opening >90%. This likely is contributing to her her cough due to inability to clear mucous. Suspect related to aspiration of vitamin C pill two years ago. Scar pain is likely contributing as well.  Chronic bronchitis of right middle lobe  Scarring secondary to aspiration Mucous retention --Counseled on supportive management as limited management options --Previously tried azithromycin  and prednisone  without benefit --No indication for prophylactic medications --Can consider pulmonary function test if breathing issues worsen but suspect  restriction   Health Maintenance Immunization History  Administered Date(s) Administered   Fluad Quad(high Dose 65+) 06/06/2019, 06/20/2020, 06/23/2021, 07/04/2022, 06/29/2023   INFLUENZA, HIGH DOSE SEASONAL PF 06/16/2016, 06/29/2017, 06/27/2018   Influenza Split 07/16/2011, 07/05/2012, 06/26/2024   Influenza,inj,Quad PF,6+ Mos 06/20/2013, 06/21/2014, 06/25/2015   PFIZER(Purple Top)SARS-COV-2 Vaccination 10/16/2019, 11/06/2019, 07/04/2020, 06/09/2021   Pfizer Covid-19 Vaccine Bivalent Booster 55yrs & up 05/19/2023   Pfizer(Comirnaty)Fall Seasonal Vaccine 12 years and older 06/22/2022   Pneumococcal Conjugate-13 08/14/2013   Pneumococcal Polysaccharide-23 05/07/2011   Tdap 05/07/2011   Zoster Recombinant(Shingrix ) 10/24/2021, 02/05/2022   Zoster, Live 02/16/2008    No orders of the defined types were placed in this encounter.  No orders of the defined types were placed in this encounter.   Return in about 1 year (around 09/25/2025).  I have spent a total time of 31-minutes on the day of the appointment including chart review, data review, collecting history, coordinating care and  discussing medical diagnosis and plan with the patient/family. Past medical history, allergies, medications were reviewed. Pertinent imaging, labs and tests included in this note have been reviewed and interpreted independently by me.  Rakeen Gaillard Slater Staff, MD [facility] 09/25/2024 12:30 PM     "

## 2024-10-09 NOTE — Progress Notes (Signed)
 " [facility] [address] [place] [address] Dept: [phone] Dept Fax: [phone]  Chronic Care Office Visit  Subjective:    Patient ID: [name], female    DOB: [birth year], 79 y.o..   MRN: [id number]  No chief complaint on file.  History of Present Illness:  Patient is in today for reassessment of chronic medical conditions.   When I initially saw [name], her AST was elevated, which we had though may have been related to her weekly alcohol consumption, but after a month of abstinence it was still elevated. This is notably a historical trend since 2020.  [name] lowest T-score was -2.6 in November 2025. She has since been started on risedronate  5 mg daily.   Past Medical History: Patient Active Problem List   Diagnosis Date Noted   Atelectasis 06/01/2024   Pruritus 08/24/2023   Restrictive lung disease 08/27/2022   Upper airway cough syndrome 04/02/2022   Elevated liver enzymes 01/07/2022   Esophageal motility disorder 06/23/2021   Coronary atherosclerosis 07/31/2019   Well adult exam 04/27/2016   Degenerative disc disease, lumbar 12/14/2013   Piriformis syndrome of left side 08/07/2013   Atrophic vaginitis    Chronic fatigue disorder 07/03/2008   Internal hemorrhoids 01/18/2008   Osteoporosis 01/18/2008   Diverticulosis of colon 10/26/2007   Personal history of peptic ulcer disease 10/26/2007   Gastritis and gastroduodenitis 06/10/2007   History of colonic polyps 06/10/2007   Past Surgical History:  Procedure Laterality Date   APPENDECTOMY  1963   BRONCHIAL BIOPSY  06/02/2024   Procedure: BRONCHOSCOPY, WITH BIOPSY;  Surgeon: Kassie Acquanetta Bradley, MD;  Location: [facility];  Service: Cardiopulmonary;;   BRONCHIAL BRUSHINGS  06/02/2024   Procedure: BRONCHOSCOPY, WITH BRUSH BIOPSY;  Surgeon: Kassie Acquanetta Bradley, MD;  Location: [facility];  Service: Cardiopulmonary;;   BRONCHIAL WASHINGS   06/02/2024   Procedure: GAYNELL MANI;  Surgeon: Kassie Acquanetta Bradley, MD;  Location: [facility];  Service: Cardiopulmonary;;   COLONOSCOPY W/ BIOPSIES     multiple   ESOPHAGOGASTRODUODENOSCOPY  03/30/2006   FLEXIBLE BRONCHOSCOPY Right 06/02/2024   Procedure: ELLIOTT SIDE;  Surgeon: Kassie Acquanetta Bradley, MD;  Location: [facility];  Service: Cardiopulmonary;  Laterality: Right;   GANGLION CYST EXCISION  1972   Right   Gastric Ulcer and GI Bleed (hospitalization)  5/07   LYMPH NODE BIOPSY  2001   Benign from the groin-Inflammatory changes due to her skin condition.   s/p eradication of H. Pylori     confirmed by urea breath teast   SKIN CANCER EXCISION     removed from the forehead   TONSILLECTOMY  1955   TUBAL LIGATION     Family History  Problem Relation Age of Onset   Breast cancer Mother        Age 12's   Heart disease Mother    Arthritis Mother    Obesity Mother    Dementia Father    Heart disease Father    Peptic Ulcer Disease Father    Diabetes Brother    Breast cancer Maternal Aunt        Age 37's   Colon cancer Maternal Uncle    AAA (abdominal aortic aneurysm) Paternal Uncle    Heart disease Maternal Grandmother    Colon cancer Maternal Grandfather    Breast cancer Cousin        maternal   Colon cancer Cousin    Breast cancer Cousin    Breast  cancer Cousin    Outpatient Medications Prior to Visit  Medication Sig Dispense Refill   cholecalciferol (VITAMIN D ) 1000 UNITS tablet Take 2,000 Units by mouth daily.     Multiple Vitamins-Minerals (MULTIVITAMIN ADULTS 50+) TABS      risedronate  (ACTONEL ) 5 MG tablet Take 1 tablet (5 mg total) by mouth daily before breakfast. with water on empty stomach, nothing by mouth or lie down for next 30 minutes. (Patient not taking: Reported on 09/25/2024) 90 tablet 3   No facility-administered medications prior to visit.   Allergies[1]   Objective:   There were no vitals filed for this visit. There is no height or  weight on file to calculate BMI.   General: Well developed, well nourished. No acute distress. HEENT: Normocephalic, non-traumatic. PERRL, EOMI. Conjunctiva clear. External ears normal. EAC and TMs normal bilaterally. Nose    clear without congestion or rhinorrhea. Mucous membranes moist. Oropharynx clear. Good dentition. Neck: Supple. No lymphadenopathy. No thyromegaly. Lungs: Clear to auscultation bilaterally. No wheezing, rales or rhonchi. CV: RRR without murmurs or rubs. Pulses 2+ bilaterally. Abdomen: Soft, non-tender. Bowel sounds positive, normal pitch and frequency. No hepatosplenomegaly. No rebound or guarding. Back: Straight. No CVA tenderness bilaterally. Extremities: Full ROM. No joint swelling or tenderness. No edema noted. Skin: Warm and dry. No rashes. Neuro: CN II-XII intact. Normal sensation and DTR bilaterally. Psych: Alert and oriented. Normal mood and affect.  Health Maintenance Due  Topic Date Due   DTaP/Tdap/Td (2 - Td or Tdap) 05/06/2021   COVID-19 Vaccine (7 - 2025-26 season) 05/22/2024   Lab Results {Labs (Optional):29002}    Assessment & Plan:   Problem List Items Addressed This Visit   None   No follow-ups on file.   Garnette CHRISTELLA Simpler, MD  I,Emily Lagle,acting as a scribe for Garnette CHRISTELLA Simpler, MD.,have documented all relevant documentation on the behalf of Garnette CHRISTELLA Simpler, MD.  I, Garnette CHRISTELLA Simpler, MD, have reviewed all documentation for this visit. The documentation on 10/10/2024 for the exam, diagnosis, procedures, and orders are all accurate and complete.     [1]  Allergies Allergen Reactions   Fosamax [Alendronate Sodium] Other (See Comments)    Abdominal pain   Zanaflex  [Tizanidine ]     Did not feel good   "

## 2024-10-09 NOTE — Assessment & Plan Note (Signed)
 lowest T-score was -2.6 in November 2025. Continue Risedronate  5 mg daily.

## 2024-10-09 NOTE — Assessment & Plan Note (Signed)
 AST elevated even after 1 month abstinence from alcohol consumption. Continue to avoid EToH.

## 2024-10-10 ENCOUNTER — Encounter: Payer: Self-pay | Admitting: Family Medicine

## 2024-10-10 ENCOUNTER — Ambulatory Visit: Admitting: Family Medicine

## 2024-10-10 VITALS — BP 114/68 | HR 51 | Temp 98.1°F | Ht 65.0 in | Wt 94.2 lb

## 2024-10-10 DIAGNOSIS — R748 Abnormal levels of other serum enzymes: Secondary | ICD-10-CM

## 2024-10-10 DIAGNOSIS — M810 Age-related osteoporosis without current pathological fracture: Secondary | ICD-10-CM

## 2024-10-10 DIAGNOSIS — H3530 Unspecified macular degeneration: Secondary | ICD-10-CM | POA: Diagnosis not present

## 2024-10-10 MED ORDER — RISEDRONATE SODIUM 5 MG PO TABS: 5.0000 mg | ORAL_TABLET | Freq: Every day | ORAL | 3 refills | Status: AC

## 2024-10-10 NOTE — Assessment & Plan Note (Signed)
 Engaged with a retinal specialist for management.

## 2025-03-22 ENCOUNTER — Ambulatory Visit

## 2025-04-10 ENCOUNTER — Encounter: Admitting: Family Medicine

## 2025-04-10 ENCOUNTER — Ambulatory Visit: Admitting: Family Medicine
